# Patient Record
Sex: Female | Born: 1985 | Race: Black or African American | Hispanic: No | Marital: Single | Smoking: Former smoker
Health system: Southern US, Community
[De-identification: ages and names within clinical notes are randomized; demographics above are authoritative.]

## PROBLEM LIST (undated history)

## (undated) DIAGNOSIS — K219 Gastro-esophageal reflux disease without esophagitis: Secondary | ICD-10-CM

## (undated) DIAGNOSIS — T7840XA Allergy, unspecified, initial encounter: Secondary | ICD-10-CM

## (undated) DIAGNOSIS — Z87442 Personal history of urinary calculi: Secondary | ICD-10-CM

## (undated) DIAGNOSIS — F419 Anxiety disorder, unspecified: Secondary | ICD-10-CM

## (undated) DIAGNOSIS — D649 Anemia, unspecified: Secondary | ICD-10-CM

## (undated) HISTORY — DX: Allergy, unspecified, initial encounter: T78.40XA

## (undated) HISTORY — PX: WISDOM TOOTH EXTRACTION: SHX21

---
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
## 2006-09-26 ENCOUNTER — Emergency Department (HOSPITAL_COMMUNITY): Admission: EM | Admit: 2006-09-26 | Discharge: 2006-09-26 | Payer: Self-pay | Admitting: Family Medicine

## 2007-02-10 ENCOUNTER — Encounter: Admission: RE | Admit: 2007-02-10 | Discharge: 2007-02-10 | Payer: Self-pay | Admitting: Neurology

## 2008-04-09 ENCOUNTER — Emergency Department (HOSPITAL_COMMUNITY): Admission: EM | Admit: 2008-04-09 | Discharge: 2008-04-09 | Payer: Self-pay | Admitting: Emergency Medicine

## 2008-11-18 ENCOUNTER — Emergency Department (HOSPITAL_COMMUNITY): Admission: EM | Admit: 2008-11-18 | Discharge: 2008-11-18 | Payer: Self-pay | Admitting: Emergency Medicine

## 2009-02-20 ENCOUNTER — Emergency Department (HOSPITAL_COMMUNITY): Admission: EM | Admit: 2009-02-20 | Discharge: 2009-02-20 | Payer: Self-pay | Admitting: Emergency Medicine

## 2010-02-27 ENCOUNTER — Emergency Department (HOSPITAL_COMMUNITY): Admission: EM | Admit: 2010-02-27 | Discharge: 2010-02-27 | Payer: Self-pay | Admitting: Family Medicine

## 2010-07-14 ENCOUNTER — Emergency Department (HOSPITAL_COMMUNITY)
Admission: EM | Admit: 2010-07-14 | Discharge: 2010-07-14 | Payer: Self-pay | Source: Home / Self Care | Admitting: Emergency Medicine

## 2011-01-27 ENCOUNTER — Inpatient Hospital Stay (INDEPENDENT_AMBULATORY_CARE_PROVIDER_SITE_OTHER)
Admission: RE | Admit: 2011-01-27 | Discharge: 2011-01-27 | Disposition: A | Discharge status: Home / Self Care | Payer: Self-pay | Source: Ambulatory Visit | Attending: Emergency Medicine | Admitting: Emergency Medicine

## 2011-01-27 ENCOUNTER — Ambulatory Visit (INDEPENDENT_AMBULATORY_CARE_PROVIDER_SITE_OTHER): Payer: Self-pay

## 2011-01-27 DIAGNOSIS — J40 Bronchitis, not specified as acute or chronic: Secondary | ICD-10-CM

## 2011-01-27 DIAGNOSIS — J45909 Unspecified asthma, uncomplicated: Secondary | ICD-10-CM

## 2011-01-27 DIAGNOSIS — J019 Acute sinusitis, unspecified: Secondary | ICD-10-CM

## 2011-01-27 DIAGNOSIS — J309 Allergic rhinitis, unspecified: Secondary | ICD-10-CM

## 2011-01-27 DIAGNOSIS — K219 Gastro-esophageal reflux disease without esophagitis: Secondary | ICD-10-CM

## 2011-04-05 LAB — POCT INFECTIOUS MONO SCREEN: Mono Screen: NEGATIVE

## 2011-05-07 LAB — OB RESULTS CONSOLE GC/CHLAMYDIA: Gonorrhea: NEGATIVE

## 2011-05-07 LAB — OB RESULTS CONSOLE ABO/RH

## 2011-05-07 LAB — OB RESULTS CONSOLE HIV ANTIBODY (ROUTINE TESTING): HIV: NONREACTIVE

## 2011-05-07 LAB — OB RESULTS CONSOLE ANTIBODY SCREEN: Antibody Screen: NEGATIVE

## 2011-06-15 ENCOUNTER — Encounter: Payer: Self-pay | Admitting: *Deleted

## 2011-06-15 ENCOUNTER — Emergency Department (HOSPITAL_COMMUNITY)
Admission: EM | Admit: 2011-06-15 | Discharge: 2011-06-15 | Disposition: A | Discharge status: Home / Self Care | Payer: Self-pay | Source: Home / Self Care | Attending: Emergency Medicine | Admitting: Emergency Medicine

## 2011-06-15 DIAGNOSIS — J111 Influenza due to unidentified influenza virus with other respiratory manifestations: Secondary | ICD-10-CM

## 2011-06-15 MED ORDER — ACETAMINOPHEN 325 MG PO TABS
650.0000 mg | ORAL_TABLET | Freq: Once | ORAL | Status: AC
  Administered 2011-06-15: 650 mg via ORAL

## 2011-06-15 MED ORDER — ACETAMINOPHEN-CODEINE #3 300-30 MG PO TABS: 1.0000 | ORAL_TABLET | Freq: Four times a day (QID) | ORAL | Status: AC | PRN

## 2011-06-15 MED ORDER — ACETAMINOPHEN 325 MG PO TABS
ORAL_TABLET | ORAL | Status: AC
  Filled 2011-06-15: qty 2

## 2011-06-15 NOTE — ED Provider Notes (Signed)
History     CSN: [id number] Arrival date & time: 06/15/2011  6:47 PM   First MD Initiated Contact with Patient 06/15/11 1832      Chief Complaint  Patient presents with  . Fever    (Consider location/radiation/quality/duration/timing/severity/associated sxs/prior treatment) HPI Comments: Feels like I have the flu or something, coughing and congested, the first day vomited, having body aches, runny nose  and headaches  Patient is a 25 y.o. female presenting with fever. The history is provided by the patient.  Fever Primary symptoms of the febrile illness include fever, headaches, cough, vomiting, myalgias and arthralgias. Primary symptoms do not include wheezing, shortness of breath or rash. The current episode started 3 to 5 days ago. This is a new problem. The problem has not changed since onset.   History reviewed. No pertinent past medical history.  History reviewed. No pertinent past surgical history.  History reviewed. No pertinent family history.  History  Substance Use Topics  . Smoking status: Never Smoker   . Smokeless tobacco: Not on file  . Alcohol Use: No    OB History    Grav Para Term Preterm Abortions TAB SAB Ect Mult Living   1               Review of Systems  Constitutional: Positive for fever.  Respiratory: Positive for cough. Negative for shortness of breath and wheezing.   Gastrointestinal: Positive for vomiting.  Genitourinary: Negative for vaginal bleeding.  Musculoskeletal: Positive for myalgias and arthralgias.  Skin: Negative for rash.  Neurological: Positive for headaches.    Allergies  Review of patient's allergies indicates no known allergies.  Home Medications   Current Outpatient Rx  Name Route Sig Dispense Refill  . GUAIFENESIN 100 MG/5ML PO LIQD Oral Take 200 mg by mouth 3 (three) times daily as needed.      . ACETAMINOPHEN-CODEINE #3 300-30 MG PO TABS Oral Take 1-2 tablets by mouth every 6 (six) hours as needed for pain. 15  tablet 0    BP 135/72  Pulse 115  Temp(Src) 101.6 F (38.7 C) (Oral)  Resp 16  SpO2 100%  LMP 01/04/2011  Physical Exam  Nursing note and vitals reviewed. Constitutional: She is oriented to person, place, and time. She appears well-developed. She is active.  Non-toxic appearance. She does not have a sickly appearance. No distress.  HENT:  Head: Normocephalic.  Right Ear: Tympanic membrane normal.  Left Ear: Tympanic membrane normal.  Nose: Rhinorrhea present.  Mouth/Throat: Uvula is midline and mucous membranes are normal. Posterior oropharyngeal erythema present. No oropharyngeal exudate or tonsillar abscesses.  Eyes: Conjunctivae are normal.  Neck: Trachea normal and normal range of motion. No JVD present.  Cardiovascular: Normal rate.   Pulmonary/Chest: Effort normal and breath sounds normal. No respiratory distress. She has no decreased breath sounds. She has no wheezes. She has no rhonchi. She has no rales.  Lymphadenopathy:    She has no cervical adenopathy.  Neurological: She is alert and oriented to person, place, and time.  Skin: Skin is warm.    ED Course  Procedures (including critical care time)  Labs Reviewed - No data to display No results found.   1. Influenza-like illness       MDM  ILI x 4 days.No DYSPNEA comfortable but febrile. Obvious upper respiratory congestion ( rhinorrhea)        Jimmie Molly, MD 06/15/11 2104

## 2011-06-15 NOTE — ED Notes (Signed)
Pt  Has  Symptoms  Of  Cough  /  Body  Aches  Fever      Vomiting  As  Well  As  General  Weakness  As  Well  -  The  Symptoms  Have  Been present      X  Several days      She is   Preg

## 2011-07-20 ENCOUNTER — Other Ambulatory Visit (HOSPITAL_COMMUNITY): Payer: Self-pay | Admitting: Obstetrics and Gynecology

## 2011-07-20 DIAGNOSIS — Z3689 Encounter for other specified antenatal screening: Secondary | ICD-10-CM

## 2011-08-16 ENCOUNTER — Ambulatory Visit (HOSPITAL_COMMUNITY): Payer: Medicaid Other

## 2011-08-20 ENCOUNTER — Ambulatory Visit (HOSPITAL_COMMUNITY)
Admission: RE | Admit: 2011-08-20 | Discharge: 2011-08-20 | Disposition: A | Discharge status: Home / Self Care | Payer: Medicaid Other | Source: Ambulatory Visit | Attending: Obstetrics and Gynecology | Admitting: Obstetrics and Gynecology

## 2011-08-20 DIAGNOSIS — Z363 Encounter for antenatal screening for malformations: Secondary | ICD-10-CM | POA: Insufficient documentation

## 2011-08-20 DIAGNOSIS — O99210 Obesity complicating pregnancy, unspecified trimester: Secondary | ICD-10-CM | POA: Insufficient documentation

## 2011-08-20 DIAGNOSIS — O358XX0 Maternal care for other (suspected) fetal abnormality and damage, not applicable or unspecified: Secondary | ICD-10-CM | POA: Insufficient documentation

## 2011-08-20 DIAGNOSIS — Z3689 Encounter for other specified antenatal screening: Secondary | ICD-10-CM

## 2011-08-20 DIAGNOSIS — E669 Obesity, unspecified: Secondary | ICD-10-CM | POA: Insufficient documentation

## 2011-08-20 DIAGNOSIS — Z1389 Encounter for screening for other disorder: Secondary | ICD-10-CM | POA: Insufficient documentation

## 2011-08-24 ENCOUNTER — Other Ambulatory Visit (HOSPITAL_COMMUNITY): Payer: Self-pay | Admitting: Obstetrics and Gynecology

## 2011-08-24 DIAGNOSIS — Z3689 Encounter for other specified antenatal screening: Secondary | ICD-10-CM

## 2011-09-08 ENCOUNTER — Emergency Department (HOSPITAL_COMMUNITY)
Admission: EM | Admit: 2011-09-08 | Discharge: 2011-09-08 | Disposition: A | Discharge status: Home Health | Payer: Medicaid Other | Source: Home / Self Care | Attending: Emergency Medicine | Admitting: Emergency Medicine

## 2011-09-08 ENCOUNTER — Other Ambulatory Visit: Payer: Self-pay

## 2011-09-08 ENCOUNTER — Encounter (HOSPITAL_COMMUNITY): Payer: Self-pay | Admitting: Emergency Medicine

## 2011-09-08 ENCOUNTER — Emergency Department (HOSPITAL_COMMUNITY)
Admission: EM | Admit: 2011-09-08 | Discharge: 2011-09-09 | Disposition: A | Discharge status: Home / Self Care | Payer: Medicaid Other | Attending: Emergency Medicine | Admitting: Emergency Medicine

## 2011-09-08 DIAGNOSIS — R079 Chest pain, unspecified: Secondary | ICD-10-CM | POA: Insufficient documentation

## 2011-09-08 DIAGNOSIS — J45909 Unspecified asthma, uncomplicated: Secondary | ICD-10-CM | POA: Insufficient documentation

## 2011-09-08 DIAGNOSIS — R000 Tachycardia, unspecified: Secondary | ICD-10-CM | POA: Insufficient documentation

## 2011-09-08 DIAGNOSIS — R059 Cough, unspecified: Secondary | ICD-10-CM

## 2011-09-08 DIAGNOSIS — O9989 Other specified diseases and conditions complicating pregnancy, childbirth and the puerperium: Secondary | ICD-10-CM | POA: Insufficient documentation

## 2011-09-08 DIAGNOSIS — J189 Pneumonia, unspecified organism: Secondary | ICD-10-CM

## 2011-09-08 DIAGNOSIS — R05 Cough: Secondary | ICD-10-CM

## 2011-09-08 MED ORDER — AZITHROMYCIN 250 MG PO TABS
500.0000 mg | ORAL_TABLET | Freq: Once | ORAL | Status: AC
  Administered 2011-09-08: 500 mg via ORAL
  Filled 2011-09-08: qty 2

## 2011-09-08 MED ORDER — AZITHROMYCIN 250 MG PO TABS: 250.0000 mg | ORAL_TABLET | Freq: Every day | ORAL | Status: DC

## 2011-09-08 NOTE — Discharge Instructions (Signed)
Follow up with Dr. Tawanna Cooler as soon as possible, but definitely by early next week. Take medications as prescribed. Followup with your doctor in regards to your hospital visit. If you do not have a doctor use the resource guide listed below to help you find one. You may return to the emergency department if symptoms worsen, become progressive, or become more concerning.  Pneumonia, Adult Pneumonia is an infection of the lungs.  CAUSES Pneumonia may be caused by bacteria or a virus. Usually, these infections are caused by breathing infectious particles into the lungs (respiratory tract). SYMPTOMS   Cough.   Fever.   Chest pain.   Increased rate of breathing.   Wheezing.   Mucus production.  DIAGNOSIS  If you have the common symptoms of pneumonia, your caregiver will typically confirm the diagnosis with a chest X-ray. The X-ray will show an abnormality in the lung (pulmonary infiltrate) if you have pneumonia. Other tests of your blood, urine, or sputum may be done to find the specific cause of your pneumonia. Your caregiver may also do tests (blood gases or pulse oximetry) to see how well your lungs are working. TREATMENT  Some forms of pneumonia may be spread to other people when you cough or sneeze. You may be asked to wear a mask before and during your exam. Pneumonia that is caused by bacteria is treated with antibiotic medicine. Pneumonia that is caused by the influenza virus may be treated with an antiviral medicine. Most other viral infections must run their course. These infections will not respond to antibiotics.  PREVENTION A pneumococcal shot (vaccine) is available to prevent a common bacterial cause of pneumonia. This is usually suggested for:  People over 80 years old.   Patients on chemotherapy.   People with chronic lung problems, such as bronchitis or emphysema.   People with immune system problems.  If you are over 65 or have a high risk condition, you may receive the  pneumococcal vaccine if you have not received it before. In some countries, a routine influenza vaccine is also recommended. This vaccine can help prevent some cases of pneumonia.You may be offered the influenza vaccine as part of your care. If you smoke, it is time to quit. You may receive instructions on how to stop smoking. Your caregiver can provide medicines and counseling to help you quit. HOME CARE INSTRUCTIONS   Cough suppressants may be used if you are losing too much rest. However, coughing protects you by clearing your lungs. You should avoid using cough suppressants if you can.   Your caregiver may have prescribed medicine if he or she thinks your pneumonia is caused by a bacteria or influenza. Finish your medicine even if you start to feel better.   Your caregiver may also prescribe an expectorant. This loosens the mucus to be coughed up.   Only take over-the-counter or prescription medicines for pain, discomfort, or fever as directed by your caregiver.   Do not smoke. Smoking is a common cause of bronchitis and can contribute to pneumonia. If you are a smoker and continue to smoke, your cough may last several weeks after your pneumonia has cleared.   A cold steam vaporizer or humidifier in your room or home may help loosen mucus.   Coughing is often worse at night. Sleeping in a semi-upright position in a recliner or using a couple pillows under your head will help with this.   Get rest as you feel it is needed. Your body will usually  let you know when you need to rest.  SEEK IMMEDIATE MEDICAL CARE IF:   Your illness becomes worse. This is especially true if you are elderly or weakened from any other disease.   You cannot control your cough with suppressants and are losing sleep.   You begin coughing up blood.   You develop pain which is getting worse or is uncontrolled with medicines.   You have a fever.   Any of the symptoms which initially brought you in for treatment  are getting worse rather than better.   You develop shortness of breath or chest pain.  MAKE SURE YOU:   Understand these instructions.   Will watch your condition.   Will get help right away if you are not doing well or get worse.

## 2011-09-08 NOTE — ED Provider Notes (Signed)
Patient with cough for a week productive of yellow sputum.  No fever, taking po.  States baby is moving well and no contractions.  Plan z-pack and follow up with pmd.   Hilario Quarry, MD 09/08/11 [phone]

## 2011-09-08 NOTE — ED Notes (Signed)
PA at bedside.

## 2011-09-08 NOTE — ED Notes (Signed)
PT. REPORTS PRODUCTIVE COUGH WITH SOB FOR 3 WEEKS , STATES 7 MONTHS PREGNANT - G1P0 , DENIES FEVER OR CHILLS. OCCASIONAL NAUSEA /VOMITTING .

## 2011-09-08 NOTE — ED Notes (Signed)
Patient was here this morning; diagnosed with pneumonia.  Patient reports that she has been having chest pains all day and that they have been getting worse of the course of the day; patient reports that the pain worsens upon deep inspiration.  Reports chest pain on right side that radiates to back; reports short of breath.  Patient also has history of asthma.  Patient 7 months pregnant.  EKG done in triage.

## 2011-09-08 NOTE — ED Provider Notes (Signed)
History     CSN: [id number]  Arrival date & time 09/08/11  8295   First MD Initiated Contact with Patient 09/08/11 0703      Chief Complaint  Patient presents with  . Cough    (Consider location/radiation/quality/duration/timing/severity/associated sxs/prior treatment) HPI Comments: Pt is a 26 yo AA female that is G1P0, 8mo pregnant, has a hx of asthma & presents to the ED w cc of productive cough & SOB x 3 weeks. Pts OBGYN is Dr. Tawanna Cooler. She denies having contractions.   Patient is a 26 y.o. female presenting with cough. The history is provided by the patient.  Cough This is a new problem. Episode onset: 3 weeks. The problem has been gradually worsening. The cough is productive of sputum (white/yellow). There has been no fever. Associated symptoms include sweats, rhinorrhea, sore throat, shortness of breath and wheezing. Pertinent negatives include no chest pain, no chills, no weight loss, no ear congestion, no ear pain, no headaches, no myalgias and no eye redness. Associated symptoms comments: Chest tightness and abdominal pain . Treatments tried: tyelonol cold and cough & robitussin. The treatment provided no relief. She is not a smoker. Her past medical history is significant for asthma. Her past medical history does not include bronchitis, pneumonia, bronchiectasis, COPD or emphysema.    Past Medical History  Diagnosis Date  . Asthma     History reviewed. No pertinent past surgical history.  No family history on file.  History  Substance Use Topics  . Smoking status: Never Smoker   . Smokeless tobacco: Not on file  . Alcohol Use: No    OB History    Grav Para Term Preterm Abortions TAB SAB Ect Mult Living   1               Review of Systems  Constitutional: Negative for fever, chills and weight loss.  HENT: Positive for congestion, sore throat and rhinorrhea. Negative for hearing loss, ear pain, facial swelling, sneezing, drooling, trouble swallowing, neck pain, neck  stiffness, voice change and ear discharge.   Eyes: Negative for redness.  Respiratory: Positive for cough, shortness of breath and wheezing.   Cardiovascular: Negative for chest pain.  Musculoskeletal: Negative for myalgias and back pain.  Neurological: Negative for headaches.  All other systems reviewed and are negative.    Allergies  Review of patient's allergies indicates no known allergies.  Home Medications   Current Outpatient Rx  Name Route Sig Dispense Refill  . GUAIFENESIN 100 MG/5ML PO LIQD Oral Take 200 mg by mouth 3 (three) times daily as needed.        BP 123/65  Pulse 98  Temp(Src) 98.1 F (36.7 C) (Oral)  Resp 14  SpO2 98%  LMP 01/04/2011  Physical Exam  Nursing note and vitals reviewed. Constitutional: She is oriented to person, place, and time. She appears well-developed and well-nourished. No distress.  HENT:  Head: Normocephalic and atraumatic.  Right Ear: External ear normal.  Left Ear: External ear normal.  Mouth/Throat: Oropharynx is clear and moist.       Oropharynx clear, no tonsillar exudate, uvula midline, airway intact  Eyes: Conjunctivae and EOM are normal.  Neck: Normal range of motion. Neck supple.       Extension and flexion of neck w out difficulty. No stridor  Cardiovascular: Normal rate, regular rhythm, normal heart sounds and intact distal pulses.   Pulmonary/Chest: Effort normal.       Insp & expiratory wheezes, rhonchi RUL, No accessory  muscle use, nasal flaring, respiratory distress. Pt able to speak full sentences. Actively coughing every min.   Abdominal:       Gravid, non ttp  Musculoskeletal: Normal range of motion. She exhibits no edema and no tenderness.  Lymphadenopathy:    She has no cervical adenopathy.  Neurological: She is alert and oriented to person, place, and time.  Skin: Skin is warm. No rash noted. She is not diaphoretic.    ED Course  Procedures (including critical care time)  Labs Reviewed - No data to  display No results found.   No diagnosis found.    MDM  Question CAP  Pts presentation and PE concerning for CAP. Will treat with z-pak. Advised pt to follow up w her OBGYN this week or first thing next week for follow up and to continue taking Tylenol for pain. Pt advised not to take or use caution taking guaifenesin bc limited pregnancy information is on this medication Pt verbalizes understanding. VSS in NAD prior to dc.       Jaci Carrel, [place] 09/08/11 0730

## 2011-09-09 MED ORDER — ALBUTEROL SULFATE (5 MG/ML) 0.5% IN NEBU
2.5000 mg | INHALATION_SOLUTION | Freq: Once | RESPIRATORY_TRACT | Status: AC
  Administered 2011-09-09: 2.5 mg via RESPIRATORY_TRACT
  Filled 2011-09-09: qty 0.5

## 2011-09-09 NOTE — ED Provider Notes (Signed)
Medical screening examination/treatment/procedure(s) were performed by non-physician practitioner and as supervising physician I was immediately available for consultation/collaboration.  Hy Swiatek P Burhanuddin Kohlmann, MD 09/09/11 0401 

## 2011-09-09 NOTE — ED Provider Notes (Signed)
History     CSN: [id number]  Arrival date & time 09/08/11  2226   First MD Initiated Contact with Patient 09/09/11 0018      Chief Complaint  Patient presents with  . Pneumonia  . Chest Pain    (Consider location/radiation/quality/duration/timing/severity/associated sxs/prior treatment) HPI Comments: Patient was diagnosed with pneumonia earlier in the day, prescribed azithromycin.  She is 7 months pregnant.  There was positive fetal movements.  There positive fetal movements now  The history is provided by the patient.    Past Medical History  Diagnosis Date  . Asthma     History reviewed. No pertinent past surgical history.  History reviewed. No pertinent family history.  History  Substance Use Topics  . Smoking status: Never Smoker   . Smokeless tobacco: Not on file  . Alcohol Use: No    OB History    Grav Para Term Preterm Abortions TAB SAB Ect Mult Living   1               Review of Systems  Constitutional: Negative for fever, chills and appetite change.  HENT: Negative for congestion and rhinorrhea.   Respiratory: Negative for chest tightness and shortness of breath.   Cardiovascular: Negative for leg swelling.  Neurological: Negative for dizziness.    Allergies  Review of patient's allergies indicates no known allergies.  Home Medications   Current Outpatient Rx  Name Route Sig Dispense Refill  . AZITHROMYCIN 250 MG PO TABS Oral Take 250 mg by mouth daily.    . GUAIFENESIN 100 MG/5ML PO LIQD Oral Take 200 mg by mouth 3 (three) times daily as needed.        BP 115/89  Pulse 103  Temp(Src) 98.1 F (36.7 C) (Oral)  Resp 20  SpO2 97%  LMP 01/04/2011  Physical Exam  Constitutional: She is oriented to person, place, and time. She appears well-developed and well-nourished.  HENT:  Head: Normocephalic.  Eyes: Pupils are equal, round, and reactive to light.  Neck: Normal range of motion.  Cardiovascular: Tachycardia present.   Pulmonary/Chest:  Effort normal. She has wheezes. She exhibits no tenderness.  Abdominal: Soft.  Musculoskeletal: Normal range of motion.  Neurological: She is alert and oriented to person, place, and time.  Skin: Skin is warm.    ED Course  Procedures (including critical care time)  Labs Reviewed - No data to display No results found.   1. Community acquired pneumonia   2. Asthma     After albuterol treatment.  Patient is coughing much less  MDM  Pneumonia with asthma.  Patient has not used her albuterol inhaler at home, but she didn't think she could during her pregnancy and encouraged her to to use her inhaler on a regular basis for the next 2 days to please call her OB/GYN in the morning to continue taking the antibiotic as directed and to monitor fetal movements        Arman Filter, NP 09/09/11 0200

## 2011-09-09 NOTE — Discharge Instructions (Signed)
Asthma, Adult Asthma is a disease of the lungs and can make it hard to breathe. Asthma cannot be cured, but medicine can help control it. Asthma may be started (triggered) by:  Pollen.   Dust.   Animal skin flakes (dander).   Molds.   Foods.   Respiratory infections (colds, flu).   Smoke.   Exercise.   Stress.   Other things that cause allergic reactions or allergies (allergens).  HOME CARE   Talk to your doctor about how to manage your attacks at home. This may include:   Using a tool called a peak flow meter.   Having medicine ready to stop the attack.   Take all medicine as told by your doctor.   Wash bed sheets and blankets every week in hot water and put them in the dryer.   Drink enough fluids to keep your pee (urine) clear or pale yellow.   Always be ready to get emergency help. Write down the phone number for your doctor. Keep it where you can easily find it.   Talk about exercise routines with your doctor.   If animal dander is causing your asthma, you may need to find a new home for your pet(s).  GET HELP RIGHT AWAY IF:   You have muscle aches.   You cough more.   You have chest pain.   You have thick spit (sputum) that changes to yellow, green, gray, or bloody.   Medicine does not stop your wheezing.   You have problems breathing.   You have a fever.   Your medicine causes:   A rash.   Itching.   Puffiness (swelling).   Breathing problems.  MAKE SURE YOU:   Understand these instructions.   Will watch your condition.   Will get help right away if you are not doing well or get worse.  Document Released: 12/08/2007 Document Revised: 06/10/2011 Document Reviewed: 05/01/2008 [facility] Patient Information 2012 [place], [place]. As discussed.  Don't be afraid to use her inhaler every 4-6 hours as needed for coughing to try to stay very hydrated try to avoid caffeine was called.  Dr. Jackelyn Knife in the morning for further instructions please  monitor your baby's movements

## 2011-09-17 ENCOUNTER — Ambulatory Visit (HOSPITAL_COMMUNITY): Payer: Medicaid Other

## 2011-09-20 ENCOUNTER — Other Ambulatory Visit (HOSPITAL_COMMUNITY): Payer: Self-pay | Admitting: Obstetrics and Gynecology

## 2011-09-20 DIAGNOSIS — Z3689 Encounter for other specified antenatal screening: Secondary | ICD-10-CM

## 2011-09-23 ENCOUNTER — Ambulatory Visit (HOSPITAL_COMMUNITY)
Admission: RE | Admit: 2011-09-23 | Discharge: 2011-09-23 | Disposition: A | Discharge status: Home / Self Care | Payer: Medicaid Other | Source: Ambulatory Visit | Attending: Obstetrics and Gynecology | Admitting: Obstetrics and Gynecology

## 2011-09-23 DIAGNOSIS — Z3689 Encounter for other specified antenatal screening: Secondary | ICD-10-CM

## 2011-09-23 DIAGNOSIS — E669 Obesity, unspecified: Secondary | ICD-10-CM | POA: Insufficient documentation

## 2011-12-06 ENCOUNTER — Encounter (HOSPITAL_COMMUNITY): Payer: Self-pay | Admitting: *Deleted

## 2011-12-06 ENCOUNTER — Encounter (HOSPITAL_COMMUNITY): Payer: Self-pay | Admitting: Anesthesiology

## 2011-12-06 ENCOUNTER — Inpatient Hospital Stay (HOSPITAL_COMMUNITY): Payer: Medicaid Other

## 2011-12-06 ENCOUNTER — Inpatient Hospital Stay (HOSPITAL_COMMUNITY)
Admission: AD | Admit: 2011-12-06 | Discharge: 2011-12-10 | DRG: 765 | Disposition: A | Discharge status: Home / Self Care | Payer: Medicaid Other | Source: Ambulatory Visit | Attending: Obstetrics and Gynecology | Admitting: Obstetrics and Gynecology

## 2011-12-06 ENCOUNTER — Encounter (HOSPITAL_COMMUNITY)
Admission: AD | Disposition: A | Discharge status: Home / Self Care | Payer: Self-pay | Source: Ambulatory Visit | Attending: Obstetrics and Gynecology

## 2011-12-06 ENCOUNTER — Inpatient Hospital Stay (HOSPITAL_COMMUNITY): Payer: Medicaid Other | Admitting: Anesthesiology

## 2011-12-06 DIAGNOSIS — O99214 Obesity complicating childbirth: Secondary | ICD-10-CM | POA: Diagnosis present

## 2011-12-06 DIAGNOSIS — O4290 Premature rupture of membranes, unspecified as to length of time between rupture and onset of labor, unspecified weeks of gestation: Principal | ICD-10-CM | POA: Diagnosis present

## 2011-12-06 DIAGNOSIS — O99892 Other specified diseases and conditions complicating childbirth: Secondary | ICD-10-CM | POA: Diagnosis present

## 2011-12-06 DIAGNOSIS — E669 Obesity, unspecified: Secondary | ICD-10-CM | POA: Diagnosis present

## 2011-12-06 DIAGNOSIS — O4100X0 Oligohydramnios, unspecified trimester, not applicable or unspecified: Secondary | ICD-10-CM | POA: Diagnosis present

## 2011-12-06 DIAGNOSIS — Z98891 History of uterine scar from previous surgery: Secondary | ICD-10-CM

## 2011-12-06 DIAGNOSIS — Z22330 Carrier of Group B streptococcus: Secondary | ICD-10-CM

## 2011-12-06 LAB — CBC
HCT: 28.7 % — ABNORMAL LOW (ref 36.0–46.0)
MCH: 25.1 pg — ABNORMAL LOW (ref 26.0–34.0)
MCHC: 30.7 g/dL (ref 30.0–36.0)
MCV: 82 fL (ref 78.0–100.0)
RDW: 16.5 % — ABNORMAL HIGH (ref 11.5–15.5)

## 2011-12-06 LAB — ABO/RH: ABO/RH(D): O POS

## 2011-12-06 SURGERY — Surgical Case
Anesthesia: Regional | Site: Abdomen | Wound class: Clean Contaminated

## 2011-12-06 MED ORDER — ACETAMINOPHEN 325 MG PO TABS: 650.0000 mg | ORAL_TABLET | ORAL | Status: DC | PRN

## 2011-12-06 MED ORDER — IBUPROFEN 600 MG PO TABS
600.0000 mg | ORAL_TABLET | Freq: Four times a day (QID) | ORAL | Status: DC
  Administered 2011-12-07 – 2011-12-10 (×14): 600 mg via ORAL
  Filled 2011-12-06 (×14): qty 1

## 2011-12-06 MED ORDER — LACTATED RINGERS IV SOLN: 500.0000 mL | Freq: Once | INTRAVENOUS | Status: DC

## 2011-12-06 MED ORDER — CITRIC ACID-SODIUM CITRATE 334-500 MG/5ML PO SOLN
30.0000 mL | ORAL | Status: DC | PRN
  Administered 2011-12-06: 30 mL via ORAL
  Filled 2011-12-06: qty 15

## 2011-12-06 MED ORDER — EPHEDRINE 5 MG/ML INJ: 10.0000 mg | INTRAVENOUS | Status: DC | PRN

## 2011-12-06 MED ORDER — OXYTOCIN 20 UNITS IN LACTATED RINGERS INFUSION - SIMPLE
1.0000 m[IU]/min | INTRAVENOUS | Status: DC
  Administered 2011-12-06 (×2): 1 m[IU]/min via INTRAVENOUS
  Filled 2011-12-06: qty 1000

## 2011-12-06 MED ORDER — MEPERIDINE HCL 25 MG/ML IJ SOLN: 6.2500 mg | INTRAMUSCULAR | Status: DC | PRN

## 2011-12-06 MED ORDER — DIPHENHYDRAMINE HCL 12.5 MG/5ML PO ELIX
12.5000 mg | ORAL_SOLUTION | Freq: Four times a day (QID) | ORAL | Status: DC | PRN
  Filled 2011-12-06: qty 5

## 2011-12-06 MED ORDER — SODIUM CHLORIDE 0.9 % IJ SOLN: 9.0000 mL | INTRAMUSCULAR | Status: DC | PRN

## 2011-12-06 MED ORDER — MORPHINE SULFATE (PF) 0.5 MG/ML IJ SOLN
INTRAMUSCULAR | Status: DC | PRN
  Administered 2011-12-06: 3 mg via EPIDURAL

## 2011-12-06 MED ORDER — PHENYLEPHRINE 40 MCG/ML (10ML) SYRINGE FOR IV PUSH (FOR BLOOD PRESSURE SUPPORT)
80.0000 ug | PREFILLED_SYRINGE | INTRAVENOUS | Status: DC | PRN
  Filled 2011-12-06: qty 5

## 2011-12-06 MED ORDER — ONDANSETRON HCL 4 MG/2ML IJ SOLN: 4.0000 mg | Freq: Four times a day (QID) | INTRAMUSCULAR | Status: DC | PRN

## 2011-12-06 MED ORDER — OXYCODONE-ACETAMINOPHEN 5-325 MG PO TABS
1.0000 | ORAL_TABLET | ORAL | Status: DC | PRN
  Administered 2011-12-07 (×2): 2 via ORAL
  Administered 2011-12-08: 1 via ORAL
  Administered 2011-12-08: 2 via ORAL
  Administered 2011-12-08 – 2011-12-09 (×3): 1 via ORAL
  Administered 2011-12-09: 2 via ORAL
  Administered 2011-12-09 – 2011-12-10 (×4): 1 via ORAL
  Filled 2011-12-06 (×2): qty 1
  Filled 2011-12-06 (×2): qty 2
  Filled 2011-12-06 (×6): qty 1
  Filled 2011-12-06 (×2): qty 2

## 2011-12-06 MED ORDER — SCOPOLAMINE 1 MG/3DAYS TD PT72: 1.0000 | MEDICATED_PATCH | Freq: Once | TRANSDERMAL | Status: DC

## 2011-12-06 MED ORDER — PHENYLEPHRINE 40 MCG/ML (10ML) SYRINGE FOR IV PUSH (FOR BLOOD PRESSURE SUPPORT): 80.0000 ug | PREFILLED_SYRINGE | INTRAVENOUS | Status: DC | PRN

## 2011-12-06 MED ORDER — EPHEDRINE SULFATE 50 MG/ML IJ SOLN
INTRAMUSCULAR | Status: DC | PRN
  Administered 2011-12-06: 10 mg via INTRAVENOUS

## 2011-12-06 MED ORDER — OXYTOCIN 20 UNITS IN LACTATED RINGERS INFUSION - SIMPLE: 125.0000 mL/h | Freq: Once | INTRAVENOUS | Status: DC

## 2011-12-06 MED ORDER — ONDANSETRON HCL 4 MG/2ML IJ SOLN
INTRAMUSCULAR | Status: AC
  Filled 2011-12-06: qty 2

## 2011-12-06 MED ORDER — NALOXONE HCL 0.4 MG/ML IJ SOLN: 0.4000 mg | INTRAMUSCULAR | Status: DC | PRN

## 2011-12-06 MED ORDER — SODIUM BICARBONATE 8.4 % IV SOLN
INTRAVENOUS | Status: AC
  Filled 2011-12-06: qty 50

## 2011-12-06 MED ORDER — SENNOSIDES-DOCUSATE SODIUM 8.6-50 MG PO TABS
2.0000 | ORAL_TABLET | Freq: Every day | ORAL | Status: DC
  Administered 2011-12-07 – 2011-12-09 (×3): 2 via ORAL

## 2011-12-06 MED ORDER — LACTATED RINGERS IV SOLN
INTRAVENOUS | Status: DC
  Administered 2011-12-07: 03:00:00 via INTRAVENOUS

## 2011-12-06 MED ORDER — PRENATAL MULTIVITAMIN CH
1.0000 | ORAL_TABLET | Freq: Every day | ORAL | Status: DC
  Administered 2011-12-07: 09:00:00 via ORAL
  Administered 2011-12-08 – 2011-12-09 (×2): 1 via ORAL
  Filled 2011-12-06 (×3): qty 1

## 2011-12-06 MED ORDER — LIDOCAINE HCL (PF) 1 % IJ SOLN
INTRAMUSCULAR | Status: DC | PRN
  Administered 2011-12-06 (×2): 4 mL

## 2011-12-06 MED ORDER — LIDOCAINE-EPINEPHRINE (PF) 2 %-1:200000 IJ SOLN
INTRAMUSCULAR | Status: AC
  Filled 2011-12-06: qty 20

## 2011-12-06 MED ORDER — SODIUM CHLORIDE 0.9 % IJ SOLN: 3.0000 mL | INTRAMUSCULAR | Status: DC | PRN

## 2011-12-06 MED ORDER — TERBUTALINE SULFATE 1 MG/ML IJ SOLN: 0.2500 mg | Freq: Once | INTRAMUSCULAR | Status: DC | PRN

## 2011-12-06 MED ORDER — HYDROMORPHONE 0.3 MG/ML IV SOLN
INTRAVENOUS | Status: DC
  Administered 2011-12-06: 20:00:00 via INTRAVENOUS
  Administered 2011-12-07: 1.79 mg via INTRAVENOUS
  Administered 2011-12-07: 1.59 mg via INTRAVENOUS
  Filled 2011-12-06: qty 25

## 2011-12-06 MED ORDER — TETANUS-DIPHTH-ACELL PERTUSSIS 5-2.5-18.5 LF-MCG/0.5 IM SUSP
0.5000 mL | Freq: Once | INTRAMUSCULAR | Status: AC
  Administered 2011-12-08: 0.5 mL via INTRAMUSCULAR
  Filled 2011-12-06: qty 0.5

## 2011-12-06 MED ORDER — OXYTOCIN 10 UNIT/ML IJ SOLN
INTRAMUSCULAR | Status: AC
  Filled 2011-12-06: qty 4

## 2011-12-06 MED ORDER — ONDANSETRON HCL 4 MG PO TABS: 4.0000 mg | ORAL_TABLET | ORAL | Status: DC | PRN

## 2011-12-06 MED ORDER — KETOROLAC TROMETHAMINE 30 MG/ML IJ SOLN
30.0000 mg | Freq: Four times a day (QID) | INTRAMUSCULAR | Status: AC | PRN
  Administered 2011-12-06: 30 mg via INTRAVENOUS

## 2011-12-06 MED ORDER — ONDANSETRON HCL 4 MG/2ML IJ SOLN: 4.0000 mg | INTRAMUSCULAR | Status: DC | PRN

## 2011-12-06 MED ORDER — FENTANYL CITRATE 0.05 MG/ML IJ SOLN
INTRAMUSCULAR | Status: AC
  Administered 2011-12-06: 50 ug
  Filled 2011-12-06: qty 2

## 2011-12-06 MED ORDER — OXYTOCIN BOLUS FROM INFUSION
500.0000 mL | Freq: Once | INTRAVENOUS | Status: DC
  Filled 2011-12-06: qty 500

## 2011-12-06 MED ORDER — DIPHENHYDRAMINE HCL 50 MG/ML IJ SOLN: 12.5000 mg | Freq: Four times a day (QID) | INTRAMUSCULAR | Status: DC | PRN

## 2011-12-06 MED ORDER — NALBUPHINE HCL 10 MG/ML IJ SOLN
5.0000 mg | INTRAMUSCULAR | Status: DC | PRN
  Administered 2011-12-06 – 2011-12-07 (×2): 10 mg via INTRAVENOUS
  Filled 2011-12-06 (×3): qty 1

## 2011-12-06 MED ORDER — AZITHROMYCIN 250 MG PO TABS: 250.0000 mg | ORAL_TABLET | Freq: Every day | ORAL | Status: DC

## 2011-12-06 MED ORDER — WITCH HAZEL-GLYCERIN EX PADS: 1.0000 "application " | MEDICATED_PAD | CUTANEOUS | Status: DC | PRN

## 2011-12-06 MED ORDER — CEFAZOLIN SODIUM-DEXTROSE 2-3 GM-% IV SOLR
2.0000 g | Freq: Once | INTRAVENOUS | Status: AC
  Administered 2011-12-06: 2 g via INTRAVENOUS
  Filled 2011-12-06: qty 50

## 2011-12-06 MED ORDER — FENTANYL 2.5 MCG/ML BUPIVACAINE 1/10 % EPIDURAL INFUSION (WH - ANES)
INTRAMUSCULAR | Status: DC | PRN
  Administered 2011-12-06: 14 mL/h via EPIDURAL

## 2011-12-06 MED ORDER — OXYTOCIN 20 UNITS IN LACTATED RINGERS INFUSION - SIMPLE
125.0000 mL/h | INTRAVENOUS | Status: AC
  Administered 2011-12-06: 125 mL/h via INTRAVENOUS

## 2011-12-06 MED ORDER — DIPHENHYDRAMINE HCL 50 MG/ML IJ SOLN: 12.5000 mg | INTRAMUSCULAR | Status: DC | PRN

## 2011-12-06 MED ORDER — LACTATED RINGERS IV SOLN
500.0000 mL | INTRAVENOUS | Status: DC | PRN
  Administered 2011-12-06: 17:00:00 via INTRAVENOUS
  Administered 2011-12-06 (×2): 500 mL via INTRAVENOUS
  Administered 2011-12-06: 16:00:00 via INTRAVENOUS

## 2011-12-06 MED ORDER — DIPHENHYDRAMINE HCL 25 MG PO CAPS
25.0000 mg | ORAL_CAPSULE | ORAL | Status: DC | PRN
  Administered 2011-12-07: 25 mg via ORAL
  Filled 2011-12-06: qty 1

## 2011-12-06 MED ORDER — MORPHINE SULFATE 0.5 MG/ML IJ SOLN
INTRAMUSCULAR | Status: AC
  Filled 2011-12-06: qty 10

## 2011-12-06 MED ORDER — ZOLPIDEM TARTRATE 5 MG PO TABS: 5.0000 mg | ORAL_TABLET | Freq: Every evening | ORAL | Status: DC | PRN

## 2011-12-06 MED ORDER — MORPHINE SULFATE (PF) 0.5 MG/ML IJ SOLN
INTRAMUSCULAR | Status: DC | PRN
  Administered 2011-12-06: 2 mg via INTRAVENOUS

## 2011-12-06 MED ORDER — CEFAZOLIN SODIUM 1-5 GM-% IV SOLN
INTRAVENOUS | Status: AC
  Filled 2011-12-06: qty 100

## 2011-12-06 MED ORDER — OXYTOCIN 20 UNITS IN LACTATED RINGERS INFUSION - SIMPLE
INTRAVENOUS | Status: DC | PRN
  Administered 2011-12-06: 20 [IU] via INTRAVENOUS

## 2011-12-06 MED ORDER — ONDANSETRON HCL 4 MG/2ML IJ SOLN: 4.0000 mg | Freq: Three times a day (TID) | INTRAMUSCULAR | Status: DC | PRN

## 2011-12-06 MED ORDER — MENTHOL 3 MG MT LOZG: 1.0000 | LOZENGE | OROMUCOSAL | Status: DC | PRN

## 2011-12-06 MED ORDER — DIBUCAINE 1 % RE OINT: 1.0000 "application " | TOPICAL_OINTMENT | RECTAL | Status: DC | PRN

## 2011-12-06 MED ORDER — KETOROLAC TROMETHAMINE 30 MG/ML IJ SOLN: 30.0000 mg | Freq: Four times a day (QID) | INTRAMUSCULAR | Status: AC | PRN

## 2011-12-06 MED ORDER — OXYTOCIN 20 UNITS IN LACTATED RINGERS INFUSION - SIMPLE
INTRAVENOUS | Status: AC
  Administered 2011-12-06: 125 mL/h via INTRAVENOUS
  Filled 2011-12-06: qty 1000

## 2011-12-06 MED ORDER — FENTANYL CITRATE 0.05 MG/ML IJ SOLN
INTRAMUSCULAR | Status: AC
  Administered 2011-12-06: 50 ug via INTRAVENOUS
  Filled 2011-12-06: qty 2

## 2011-12-06 MED ORDER — IBUPROFEN 600 MG PO TABS: 600.0000 mg | ORAL_TABLET | Freq: Four times a day (QID) | ORAL | Status: DC | PRN

## 2011-12-06 MED ORDER — LACTATED RINGERS IV SOLN
INTRAVENOUS | Status: DC
  Administered 2011-12-06 (×2): via INTRAVENOUS

## 2011-12-06 MED ORDER — NALBUPHINE HCL 10 MG/ML IJ SOLN: 5.0000 mg | INTRAMUSCULAR | Status: DC | PRN

## 2011-12-06 MED ORDER — EPHEDRINE 5 MG/ML INJ
INTRAVENOUS | Status: AC
  Filled 2011-12-06: qty 10

## 2011-12-06 MED ORDER — EPHEDRINE 5 MG/ML INJ
10.0000 mg | INTRAVENOUS | Status: DC | PRN
Start: 2011-12-06 — End: 2011-12-06
  Filled 2011-12-06: qty 4

## 2011-12-06 MED ORDER — PENICILLIN G POTASSIUM 5000000 UNITS IJ SOLR
2.5000 10*6.[IU] | INTRAVENOUS | Status: DC
  Administered 2011-12-06: 2.5 10*6.[IU] via INTRAVENOUS
  Filled 2011-12-06 (×6): qty 2.5

## 2011-12-06 MED ORDER — DIPHENHYDRAMINE HCL 50 MG/ML IJ SOLN: 25.0000 mg | INTRAMUSCULAR | Status: DC | PRN

## 2011-12-06 MED ORDER — LIDOCAINE HCL (PF) 1 % IJ SOLN: 30.0000 mL | INTRAMUSCULAR | Status: DC | PRN

## 2011-12-06 MED ORDER — METOCLOPRAMIDE HCL 5 MG/ML IJ SOLN: 10.0000 mg | Freq: Three times a day (TID) | INTRAMUSCULAR | Status: DC | PRN

## 2011-12-06 MED ORDER — FENTANYL CITRATE 0.05 MG/ML IJ SOLN
25.0000 ug | INTRAMUSCULAR | Status: DC | PRN
  Administered 2011-12-06 (×2): 50 ug via INTRAVENOUS

## 2011-12-06 MED ORDER — FLEET ENEMA 7-19 GM/118ML RE ENEM: 1.0000 | ENEMA | RECTAL | Status: DC | PRN

## 2011-12-06 MED ORDER — SODIUM CHLORIDE 0.9 % IV SOLN: 1.0000 ug/kg/h | INTRAVENOUS | Status: DC | PRN

## 2011-12-06 MED ORDER — SIMETHICONE 80 MG PO CHEW: 80.0000 mg | CHEWABLE_TABLET | ORAL | Status: DC | PRN

## 2011-12-06 MED ORDER — LANOLIN HYDROUS EX OINT: 1.0000 "application " | TOPICAL_OINTMENT | CUTANEOUS | Status: DC | PRN

## 2011-12-06 MED ORDER — SIMETHICONE 80 MG PO CHEW
80.0000 mg | CHEWABLE_TABLET | Freq: Three times a day (TID) | ORAL | Status: DC
  Administered 2011-12-07 – 2011-12-09 (×10): 80 mg via ORAL

## 2011-12-06 MED ORDER — ONDANSETRON HCL 4 MG/2ML IJ SOLN
INTRAMUSCULAR | Status: DC | PRN
  Administered 2011-12-06: 4 mg via INTRAVENOUS

## 2011-12-06 MED ORDER — FENTANYL 2.5 MCG/ML BUPIVACAINE 1/10 % EPIDURAL INFUSION (WH - ANES)
14.0000 mL/h | INTRAMUSCULAR | Status: DC
  Filled 2011-12-06: qty 60

## 2011-12-06 MED ORDER — SODIUM BICARBONATE 8.4 % IV SOLN
INTRAVENOUS | Status: DC | PRN
  Administered 2011-12-06: 3 mL via EPIDURAL

## 2011-12-06 MED ORDER — KETOROLAC TROMETHAMINE 30 MG/ML IJ SOLN
INTRAMUSCULAR | Status: AC
  Administered 2011-12-06: 30 mg via INTRAVENOUS
  Filled 2011-12-06: qty 1

## 2011-12-06 MED ORDER — DIPHENHYDRAMINE HCL 25 MG PO CAPS: 25.0000 mg | ORAL_CAPSULE | Freq: Four times a day (QID) | ORAL | Status: DC | PRN

## 2011-12-06 MED ORDER — PENICILLIN G POTASSIUM 5000000 UNITS IJ SOLR
5.0000 10*6.[IU] | Freq: Once | INTRAVENOUS | Status: AC
  Administered 2011-12-06: 5 10*6.[IU] via INTRAVENOUS
  Filled 2011-12-06: qty 5

## 2011-12-06 MED ORDER — OXYCODONE-ACETAMINOPHEN 5-325 MG PO TABS: 1.0000 | ORAL_TABLET | ORAL | Status: DC | PRN

## 2011-12-06 SURGICAL SUPPLY — 29 items
CHLORAPREP W/TINT 26ML (MISCELLANEOUS) ×2 IMPLANT
CLOTH BEACON ORANGE TIMEOUT ST (SAFETY) ×2 IMPLANT
CONTAINER PREFILL 10% NBF 15ML (MISCELLANEOUS) IMPLANT
DRSG COVADERM 4X10 (GAUZE/BANDAGES/DRESSINGS) ×2 IMPLANT
ELECT REM PT RETURN 9FT ADLT (ELECTROSURGICAL) ×2
ELECTRODE REM PT RTRN 9FT ADLT (ELECTROSURGICAL) ×1 IMPLANT
EXTRACTOR VACUUM KIWI (MISCELLANEOUS) IMPLANT
EXTRACTOR VACUUM M CUP 4 TUBE (SUCTIONS) IMPLANT
GLOVE BIO SURGEON STRL SZ 6.5 (GLOVE) ×4 IMPLANT
GLOVE BIO SURGEON STRL SZ8 (GLOVE) ×2 IMPLANT
GOWN PREVENTION PLUS LG XLONG (DISPOSABLE) ×4 IMPLANT
KIT ABG SYR 3ML LUER SLIP (SYRINGE) IMPLANT
NEEDLE HYPO 25X5/8 SAFETYGLIDE (NEEDLE) ×2 IMPLANT
NS IRRIG 1000ML POUR BTL (IV SOLUTION) ×2 IMPLANT
PACK C SECTION WH (CUSTOM PROCEDURE TRAY) ×2 IMPLANT
RTRCTR C-SECT PINK 25CM LRG (MISCELLANEOUS) ×2 IMPLANT
SLEEVE SCD COMPRESS KNEE MED (MISCELLANEOUS) IMPLANT
STAPLER VISISTAT 35W (STAPLE) IMPLANT
SUT CHROMIC 1 CTX 36 (SUTURE) ×4 IMPLANT
SUT PLAIN 0 NONE (SUTURE) IMPLANT
SUT PLAIN 2 0 XLH (SUTURE) IMPLANT
SUT VIC AB 0 CT1 27 (SUTURE) ×2
SUT VIC AB 0 CT1 27XBRD ANBCTR (SUTURE) ×2 IMPLANT
SUT VIC AB 2-0 CT1 27 (SUTURE)
SUT VIC AB 2-0 CT1 TAPERPNT 27 (SUTURE) IMPLANT
SUT VIC AB 4-0 KS 27 (SUTURE) IMPLANT
TOWEL OR 17X24 6PK STRL BLUE (TOWEL DISPOSABLE) ×4 IMPLANT
TRAY FOLEY CATH 14FR (SET/KITS/TRAYS/PACK) ×2 IMPLANT
WATER STERILE IRR 1000ML POUR (IV SOLUTION) IMPLANT

## 2011-12-06 NOTE — Anesthesia Postprocedure Evaluation (Signed)
  Anesthesia Post-op Note  Patient: Jill Odom  Procedure(s) Performed: Procedure(s) (LRB): CESAREAN SECTION (N/A)  Patient Location: PACU  Anesthesia Type: Epidural  Level of Consciousness: awake, alert  and oriented  Airway and Oxygen Therapy: Patient Spontanous Breathing  Post-op Pain: none  Post-op Assessment: Post-op Vital signs reviewed, Patient's Cardiovascular Status Stable, Respiratory Function Stable, Patent Airway, No signs of Nausea or vomiting, Adequate PO intake and Pain level controlled  Post-op Vital Signs: Reviewed and stable  Complications: No apparent anesthesia complications

## 2011-12-06 NOTE — Progress Notes (Signed)
Patient ID: Jill Odom, female   DOB: [birth year], 26 y.o.   MRN: [id number] As noted, pt came in for SROM at 130am and was placed on pitocin with variable decels noted.  Pitocin was d/c'd and FHR now improved, some variability, occasional mild variables.  Pt now feeling minimal contractions though with Pitocin off.  Pitocin will be restarted. Pt plans epidural and anesthesia aware she is in-house.  Cervix 50/2-3/-2  IUPC and FSE placed.  D/w pt that she is remote from delivery and that if baby does not tolerate labor, we will have to proceed with a c-section.  We reviewed the c-section process by steps and pt expressed understanding.  Will follow FHR closely. Pt is on her PCN for +GBS.

## 2011-12-06 NOTE — Op Note (Signed)
Operative note  Preoperative diagnosis Term pregnancy at 40-3/7 weeks Nonreassuring fetal heart rate with repetitive late decelerations  Postoperative diagnosis Same with occult cord by the fetal head and a nuchal cord x1  Procedure Primary low transverse C-section with 2 layer closure of uterus  Surgeon Dr. Huel Cote  Anesthesia Epidural  Fluids Estimated blood loss 800 cc Urine output 150 cc clear LR 2200 cc  Findings There is a viable female infant in the vertex presentation. An occult cord was noted by the fetal head and a nuchal cord x1. Apgars were 8 and 9. Weight pending at time of dictation normal uterus tubes and ovaries were noted.   Specimen  Placenta sent to L&D   Procedure note  After repetitive late decelerations were noted on the fetal strip the patient was counseled for cesarean section and given informed consent. She was then taken to the operating room where her epidural anesthesia was dosed and found to be adequate by Allis clamp test. She was prepped and draped in the normal sterile fashion in the dorsal supine position with leftward tilt. A time out was performed and a Pfannenstiel skin incision then made with the scalpel and carried through to the underlying layer of fascia by sharp dissection and Bovie cautery. The fascia was then nicked in the midline and the incision extended laterally with Mayo scissors. The inferior aspect of the incision was grasped and dissected off the underlying rectus muscles. In a similar fashion the superior aspect was grasped and dissected off the rectus muscles. These were separated in the midline and the and the peritoneal cavity was entered bluntly. The peritoneal incision was then extended both superiorly and inferiorly with careful attention to avoid both bowel bladder. The Alexis self-retaining wound retractor was then placed within the incision and the lower uterine segment exposed. The uterine segment was then incised in a  transverse fashion and the uterine cavity itself entered bluntly. The vertex was then elevated to the level of the uterine incision and as fundal pressure did not deliver the baby a Kiwi vacuum applied and the vertex delivered with one pull in the green zone. The remainder of the body delivered without difficulty. Neonatology was present and after the cord was clamped and cut assessed the baby in the OR. The placenta was then spontaneously expressed and the uterus cleared of all clots and debris with moist lap sponge. The uterine incision was then closed in 2 layers the first a running locked layer 1-0 chromic and the second an imbricating layer of the same suture. Good hemostasis was observed. The gutters were cleared of all clots and the tubes and ovaries inspected bilaterally and found to be normal. At this point the Alexis retractor was removed and subfascial planes inspected and found to be hemostatic. The rectus muscles and peritoneum were then reapproximated with several interrupted mattress sutures of 2-0 Vicryl. The fascia was then closed with 0 Vicryl in a running fashion. The subcutaneous tissue was reapproximated with 2-0 plain in a running fashion. The skin was closed with a subcuticular stitch of 4-0 Vicryl on a Keith needle. The incision was reinforced with benzoin and Steri-Strips. Sponge lap and needle counts were correct x2 and the patient was taken to the recovery room in good condition.

## 2011-12-06 NOTE — Progress Notes (Signed)
Patient c/o  7/10 incisional pain. Had 30 mg toradol IV in PACU at 1820. Dr. Brayton Caves called. Order received for PCA. Will continue to monitor patient.

## 2011-12-06 NOTE — Anesthesia Procedure Notes (Signed)
Epidural Patient location during procedure: OB Start time: 12/06/2011 2:14 PM  Staffing Anesthesiologist: Mena Lienau A. Performed by: anesthesiologist   Preanesthetic Checklist Completed: patient identified, site marked, surgical consent, pre-op evaluation, timeout performed, IV checked, risks and benefits discussed and monitors and equipment checked  Epidural Patient position: sitting Prep: site prepped and draped and DuraPrep Patient monitoring: continuous pulse ox and blood pressure Approach: midline Injection technique: LOR air  Needle:  Needle type: Tuohy  Needle gauge: 17 G Needle length: 15 cm Needle insertion depth: 5 cm cm Catheter type: closed end flexible Catheter size: 19 Gauge Catheter at skin depth: 10 cm Test dose: negative and Other  Assessment Events: blood not aspirated, injection not painful, no injection resistance, negative IV test and no paresthesia  Additional Notes Patient identified. Risks and benefits discussed including failed block, incomplete  Pain control, post dural puncture headache, nerve damage, paralysis, blood pressure Changes, nausea, vomiting, reactions to medications-both toxic and allergic and post Partum back pain. All questions were answered. Patient expressed understanding and wished to proceed. Sterile technique was used throughout procedure. Epidural site was Dressed with sterile barrier dressing. Left leg paresthesia transient. No signs of intravascular injection Or signs of intrathecal spread were encountered.  Patient was more comfortable after the epidural was dosed. Multiple attempts. Difficult due to super morbid obesity and poor position. Please see RN's note for documentation of vital signs and FHR which are stable.

## 2011-12-06 NOTE — Transfer of Care (Signed)
Immediate Anesthesia Transfer of Care Note  Patient: Jill Odom  Procedure(s) Performed: Procedure(s) (LRB): CESAREAN SECTION (N/A)  Patient Location: PACU  Anesthesia Type: Epidural  Level of Consciousness: awake  Airway & Oxygen Therapy: Patient Spontanous Breathing  Post-op Assessment: Report given to PACU RN  Post vital signs: stable  Complications: No apparent anesthesia complications

## 2011-12-06 NOTE — Progress Notes (Signed)
Patient ID: Jill Odom, female   DOB: 08-22-1985, 26 y.o.   MRN: [id number] CTSP for persistent late decels.  Pt has been on pitocin and ctx never reached above 150 mvu but that tracing demonstrated persistent late decels and loss of variability. Pitocin turned off and decels improved but still with almost no variability.  D/w pt the baby's intolerance of labor remote from delivery.  I feel we need to proceed with c-section in controlled fashion given her morbid obesity, epidural is working well. D/w pt risks of bleeding infection and possible damage to bowel and bladder.  Pt desires to proceed.  OR notified and preparing.  Will give Ancef now.

## 2011-12-06 NOTE — Brief Op Note (Signed)
12/06/2011  5:17 PM  PATIENT:  Jill Odom  26 y.o. female  PRE-OPERATIVE DIAGNOSIS:  Term pregnancy at 40+ weeks                                                       Nonreassuring Fetal Heartrate  POST-OPERATIVE DIAGNOSIS:  Same with occult cord by head and nuchal cord x 1  PROCEDURE:  Procedure(s) (LRB): Low Transverse CESAREAN SECTION (N/A) with two layer closure of uterus  SURGEON:  Surgeon(s) and Role:    * Oliver Pila, MD - Primary   ANESTHESIA:   epidural  EBL:  Total I/O In: 1500 [I.V.:1500] Out: 950 [Urine:250; Blood:700]  BLOOD ADMINISTERED:none  DRAINS: Urinary Catheter (Foley)   LOCAL MEDICATIONS USED:  NONE  SPECIMEN: Placenta to L&D   COUNTS:  YES   DICTATION: .Dragon Dictation  PLAN OF CARE: Admit to inpatient   PATIENT DISPOSITION:  PACU - hemodynamically stable.

## 2011-12-06 NOTE — Progress Notes (Signed)
Patient ID: Jill Odom, female   DOB: [birth year], 26 y.o.   MRN: [id number] Pt having some painful contractions, coping well.  FHR has had periods of decreased variability but improves with position change and O2.  Intermittent variable decels, some with a late component but not consistent.  She also has intermittnent accels.  Tracing overall category 2.  Pt has declined epidural until now, but I have advised her to go ahead and have placed given that FHR is still intermittently having some decels.  She is agreeable.  WIll have epidural placed and then continue to increase pitocin.  Cervix with minimal change  70/3/-2 but MVU not adequate yet. Continue to follow closely and if tracing deteriorates will plan C/Section.

## 2011-12-06 NOTE — MAU Note (Signed)
Pt reports ROM at 0115, denies pain.

## 2011-12-06 NOTE — H&P (Signed)
Jill Odom, Jill Odom NO.:  [id number]  MEDICAL RECORD NO.:  [id number]  LOCATION:  9163                          FACILITY:  [facility]  PHYSICIAN:  Malachi Pro. Ambrose Mantle, M.D. DATE OF BIRTH:  May 13, 1986  DATE OF ADMISSION:  12/06/2011 DATE OF DISCHARGE:                             HISTORY & PHYSICAL   PRESENT ILLNESS:  This is a 26 year old black female, para 0, gravida 1, EDC December 14, 2011 by her last menstrual period, admitted with premature rupture of the membranes.  The patient's last menstrual period was March 02, 2011 which placed her due date at December 07, 2011, but an ultrasound on May 04, 2011 placed her due date at December 14, 2011 and that was the official due date, December 14, 2011.  The patient's prenatal course was complicated by morbid obesity.  BMI of 56 and group B strep positive.  Her lab data showed O positive blood group and type, negative antibody, negative Pap smear, rubella immune, RPR nonreactive, urine culture negative, hepatitis B surface antigen negative, HIV negative, GC and Chlamydia negative, hemoglobin electrophoresis AA.  A 1-hour Glucola was 138, group B strep was positive.  She did have a quad screen done that was negative.  An ultrasound done on July 19, 2011 placed her average ultrasound age at 19 weeks and 6 days, due date of December 14, 2011.  She underwent an ultrasound in late May for size greater than dates, but the ultrasound revealed average for gestational age infant and normal amniotic fluid volume.  Her blood pressure has remained normal.  She was in bed at approximately 1:30 am today when she thought she had urinated on herself, but she realized it was amniotic fluid. She did not notice the color of the fluid.  She came to the hospital and on initial evaluation by the nursing staff, was noted to have a yellowish discharge from the vagina, but fern was negative.  She underwent an ultrasound, which showed oligohydramnios and then  after the slide had set for an extended period time, fern was noted and it was noted that the patient had meconium-stained fluid.  She was begun on Pitocin, but even with 3 milliunits a minute of Pitocin, she began having some decelerations of the fetal heart rate, so the Pitocin was discontinued and she was given oxygen and positioned well.  With that set of maneuvers, the fetal heart rate became normal.  PAST MEDICAL HISTORY:  Soft drinks caused her throat to feel tight.  She has no latex allergy.  No known drug allergies.  She does have a history of exercise-induced asthma and eczema as a child.  She is morbidly obese and she has had pseudotumor cerebri and she has no known surgical history.  FAMILY HISTORY:  Mother has arthritis and hypertension.  Father has asthma and paternal grandmother has diabetes.  SOCIAL HISTORY:  The patient's educational history, she never graduated from college.  She denies alcohol, tobacco, and illicit substance abuse. She has been in a relationship for 2 years.  She works as a Child psychotherapist at [employer].  PHYSICAL EXAMINATION:  VITAL SIGNS:  On admission, temperature is 98, pulse is 93, respirations 18,  blood pressure 117/63. HEART:  Normal size and sounds.  No murmurs. LUNGS:  Clear to auscultation. ABDOMEN:  Soft.  At her last prenatal visit on Dec 02, 2011, the fundal height was 43 cm.  Her fetal heart tones at this point are normal, however she did have some decelerations with Pitocin, the contractions were not printing out, so the exact nature of the decelerations cannot be determined at this point.  ADMITTING IMPRESSION:  Intrauterine pregnancy at 38 weeks and 6 days, premature rupture of the membranes.  Morbid obesity.  Positive group B strep.  The patient is going to continue on penicillin and likely the Pitocin will be restarted.  At the present time, according to the nurses's exam, the cervix is-cm thick, vertex, and -3.     Malachi Pro. Ambrose Mantle,  M.D.     TFH/MEDQ  D:  12/06/2011  T:  12/06/2011  Job:  [id number]

## 2011-12-06 NOTE — Anesthesia Preprocedure Evaluation (Signed)
Anesthesia Evaluation  Patient identified by MRN, date of birth, ID band Patient awake    Reviewed: Allergy & Precautions, H&P , NPO status , Patient's Chart, lab work & pertinent test results  Airway Mallampati: III TM Distance: >3 FB Neck ROM: Full    Dental No notable dental hx. (+) Teeth Intact   Pulmonary asthma ,  breath sounds clear to auscultation  Pulmonary exam normal       Cardiovascular negative cardio ROS  Rhythm:Regular Rate:Normal     Neuro/Psych negative neurological ROS  negative psych ROS   GI/Hepatic negative GI ROS, Neg liver ROS,   Endo/Other  Morbid obesity  Renal/GU negative Renal ROS  negative genitourinary   Musculoskeletal negative musculoskeletal ROS (+)   Abdominal (+) + obese,  Abdomen: soft.    Peds  Hematology negative hematology ROS (+)   Anesthesia Other Findings   Reproductive/Obstetrics (+) Pregnancy                           Anesthesia Physical Anesthesia Plan  ASA: III  Anesthesia Plan: Epidural   Post-op Pain Management:    Induction:   Airway Management Planned: Natural Airway  Additional Equipment:   Intra-op Plan:   Post-operative Plan:   Informed Consent: I have reviewed the patients History and Physical, chart, labs and discussed the procedure including the risks, benefits and alternatives for the proposed anesthesia with the patient or authorized representative who has indicated his/her understanding and acceptance.     Plan Discussed with: CRNA, Anesthesiologist and Surgeon  Anesthesia Plan Comments:         Anesthesia Quick Evaluation

## 2011-12-06 NOTE — MAU Note (Signed)
Pt to 163 per wheelchair for eval. Pt with wet towel between her legs, reports ROM at 0115.

## 2011-12-07 ENCOUNTER — Encounter (HOSPITAL_COMMUNITY): Payer: Self-pay | Admitting: Obstetrics and Gynecology

## 2011-12-07 LAB — CBC
HCT: 25.4 % — ABNORMAL LOW (ref 36.0–46.0)
RDW: 16.5 % — ABNORMAL HIGH (ref 11.5–15.5)
WBC: 11.5 10*3/uL — ABNORMAL HIGH (ref 4.0–10.5)

## 2011-12-07 MED ORDER — PNEUMOCOCCAL VAC POLYVALENT 25 MCG/0.5ML IJ INJ
0.5000 mL | INJECTION | Freq: Once | INTRAMUSCULAR | Status: AC
  Administered 2011-12-08: 0.5 mL via INTRAMUSCULAR
  Filled 2011-12-07 (×2): qty 0.5

## 2011-12-07 NOTE — Progress Notes (Signed)
UR chart review completed.  

## 2011-12-07 NOTE — Anesthesia Postprocedure Evaluation (Signed)
  Anesthesia Post Note  Patient: Jill Odom  Procedure(s) Performed: Procedure(s) (LRB): CESAREAN SECTION (N/A)  Anesthesia type: Epidural  Patient location: Mother/Baby  Post pain: Pain level controlled  Post assessment: Post-op Vital signs reviewed  Last Vitals:  Filed Vitals:   12/07/11 0659  BP:   Pulse:   Temp:   Resp: 26    Post vital signs: Reviewed  Level of consciousness:alert  Complications: No apparent anesthesia complications

## 2011-12-07 NOTE — Addendum Note (Signed)
Addendum  created 12/07/11 2956 by Algis Greenhouse, CRNA   Modules edited:Charges VN, Notes Section

## 2011-12-07 NOTE — Progress Notes (Signed)
Subjective: Postpartum Day 1: Cesarean Delivery Patient reports tolerating PO.  Pt on a PCA for incisional pain, wants to change to po.  Foley still in.  Objective: Vital signs in last 24 hours: Temp:  [96.3 F (35.7 C)-98 F (36.7 C)] 97.5 F (36.4 C) (06/04 0827) Pulse Rate:  [66-110] 82  (06/04 0827) Resp:  [16-97] 28  (06/04 0827) BP: (76-116)/(40-103) 95/64 mmHg (06/04 0827) SpO2:  [87 %-100 %] 98 % (06/04 0827)  Physical Exam:  General: alert Lochia: appropriate Uterine Fundus: firm Incision:C/D/I    Basename 12/07/11 0534 12/06/11 0452  HGB 7.6* 8.8*  HCT 25.4* 28.7*    Assessment/Plan: Status post Cesarean section. Doing well postoperatively.  Continue current care.   D/c PCA and transition to po meds  Rachyl Wuebker W 12/07/2011, 10:51 AM

## 2011-12-08 NOTE — Anesthesia Postprocedure Evaluation (Signed)
  Anesthesia Post Note  Patient: Jill Odom  Procedure(s) Performed: Procedure(s) (LRB): CESAREAN SECTION (N/A)  Anesthesia type: Epidural  Patient location: Mother/Baby  Post pain: Pain level controlled  Post assessment: Post-op Vital signs reviewed  Last Vitals:  Filed Vitals:   12/08/11 0610  BP: 97/64  Pulse: 92  Temp: 36.8 C  Resp: 20    Post vital signs: Reviewed  Level of consciousness:alert  Complications: No apparent anesthesia complications

## 2011-12-08 NOTE — Progress Notes (Signed)
Subjective: Postpartum Day 2: Cesarean Delivery Patient reports tolerating PO and no problems voiding.    Objective: Vital signs in last 24 hours: Temp:  [97.8 F (36.6 C)-98.3 F (36.8 C)] 98.3 F (36.8 C) (06/05 0610) Pulse Rate:  [72-92] 92  (06/05 0610) Resp:  [20-22] 20  (06/05 0610) BP: (97-126)/(64-74) 97/64 mmHg (06/05 0610)  Physical Exam:  General: alert Lochia: appropriate Uterine Fundus: firm Incision: healing well    Basename 12/07/11 0534 12/06/11 0452  HGB 7.6* 8.8*  HCT 25.4* 28.7*    Assessment/Plan: Status post Cesarean section. Doing well postoperatively.  Continue current care. Baby in NICU with low BS but doing better  Janira Mandell W 12/08/2011, 12:37 PM

## 2011-12-08 NOTE — Addendum Note (Signed)
Addendum  created 12/08/11 1249 by Algis Greenhouse, CRNA   Modules edited:Charges VN, Notes Section

## 2011-12-09 NOTE — Progress Notes (Signed)
Subjective: Postpartum Day 4: Cesarean Delivery Patient reports incisional pain, tolerating PO and no problems voiding.    Objective: Vital signs in last 24 hours: Temp:  [97.3 F (36.3 C)-98.2 F (36.8 C)] 97.3 F (36.3 C) (06/06 0617) Pulse Rate:  [85-103] 88  (06/06 0617) Resp:  [20] 20  (06/06 0617) BP: (94-124)/(61-71) 124/71 mmHg (06/06 0617) SpO2:  [100 %] 100 % (06/06 0410)  Physical Exam:  General: alert Lochia: appropriate Uterine Fundus: firm Incision: healing well, no significant erythema    Basename 12/07/11 0534  HGB 7.6*  HCT 25.4*    Assessment/Plan: Status post Cesarean section. Doing well postoperatively.  Continue current care. Baby still in NICU, doing better, pt unsure if will be ready for d/c tomorrow or not, but wants to stay with baby for one more day. Oliver Pila 12/09/2011, 8:37 AM

## 2011-12-10 MED ORDER — OXYCODONE-ACETAMINOPHEN 5-325 MG PO TABS: 1.0000 | ORAL_TABLET | ORAL | Status: AC | PRN

## 2011-12-10 MED ORDER — IBUPROFEN 600 MG PO TABS: 600.0000 mg | ORAL_TABLET | Freq: Four times a day (QID) | ORAL | Status: AC

## 2011-12-10 NOTE — Progress Notes (Signed)
Subjective: Postpartum Day 4 Cesarean Delivery Patient reports incisional pain, tolerating PO and no problems voiding.    Objective: Vital signs in last 24 hours: Temp:  [97.4 F (36.3 C)-97.7 F (36.5 C)] 97.4 F (36.3 C) (06/07 0644) Pulse Rate:  [80-96] 80  (06/07 0644) Resp:  [18-30] 19  (06/07 0644) BP: (115-125)/(68-82) 122/81 mmHg (06/07 1610)  Physical Exam:  General: alert and cooperative Lochia: appropriate Uterine Fundus: firm Incision: healing well, no significant erythema  Assessment/Plan: Status post Cesarean section. Doing well postoperatively.  Discharge home with standard precautions and return to clinic in 2 weeks for incision check. Motrin and percocet.  Jill Odom 12/10/2011, 8:45 AM

## 2011-12-10 NOTE — Discharge Summary (Signed)
Obstetric Discharge Summary Reason for Admission: rupture of membranes Prenatal Procedures: none Intrapartum Procedures: cesarean: low cervical, transverse Postpartum Procedures: none Complications-Operative and Postpartum: none Hemoglobin  Date Value Range Status  12/07/2011 7.6* 12.0-15.0 (g/dL) Final     HCT  Date Value Range Status  12/07/2011 25.4* 36.0-46.0 (%) Final    Physical Exam:  General: alert Lochia: appropriate Uterine Fundus: firm Incision: healing well, no significant erythema   Discharge Diagnoses: Term Pregnancy-delivered                                         NRFHR with occult cord  Discharge Information: Date: 12/10/2011 Activity: pelvic rest Diet: routine Medications: Ibuprofen and Percocet Condition: improved Instructions: refer to practice specific booklet Discharge to: home   Newborn Data: Live born female  Birth Weight: 8 lb 1.8 oz (3680 g) APGAR: 8, 9  Home with in NICU with d/c pending.  Jill Odom 12/10/2011, 8:47 AM

## 2011-12-12 ENCOUNTER — Observation Stay (HOSPITAL_COMMUNITY)
Admission: EM | Admit: 2011-12-12 | Discharge: 2011-12-13 | Disposition: A | Discharge status: Home / Self Care | Payer: Medicaid Other | Attending: Internal Medicine | Admitting: Internal Medicine

## 2011-12-12 ENCOUNTER — Encounter (HOSPITAL_COMMUNITY): Payer: Self-pay | Admitting: Internal Medicine

## 2011-12-12 ENCOUNTER — Emergency Department (HOSPITAL_COMMUNITY): Payer: Medicaid Other

## 2011-12-12 DIAGNOSIS — Z6841 Body Mass Index (BMI) 40.0 and over, adult: Secondary | ICD-10-CM | POA: Insufficient documentation

## 2011-12-12 DIAGNOSIS — D509 Iron deficiency anemia, unspecified: Secondary | ICD-10-CM | POA: Insufficient documentation

## 2011-12-12 DIAGNOSIS — G932 Benign intracranial hypertension: Secondary | ICD-10-CM | POA: Insufficient documentation

## 2011-12-12 DIAGNOSIS — R0602 Shortness of breath: Secondary | ICD-10-CM | POA: Diagnosis present

## 2011-12-12 DIAGNOSIS — O9089 Other complications of the puerperium, not elsewhere classified: Principal | ICD-10-CM | POA: Insufficient documentation

## 2011-12-12 DIAGNOSIS — D649 Anemia, unspecified: Secondary | ICD-10-CM | POA: Diagnosis present

## 2011-12-12 DIAGNOSIS — O9081 Anemia of the puerperium: Secondary | ICD-10-CM | POA: Insufficient documentation

## 2011-12-12 DIAGNOSIS — R0600 Dyspnea, unspecified: Secondary | ICD-10-CM

## 2011-12-12 DIAGNOSIS — R112 Nausea with vomiting, unspecified: Secondary | ICD-10-CM

## 2011-12-12 LAB — CBC
HCT: 23.4 % — ABNORMAL LOW (ref 36.0–46.0)
Hemoglobin: 7.3 g/dL — ABNORMAL LOW (ref 12.0–15.0)
MCH: 25.8 pg — ABNORMAL LOW (ref 26.0–34.0)
MCH: 25.8 pg — ABNORMAL LOW (ref 26.0–34.0)
Platelets: 441 10*3/uL — ABNORMAL HIGH (ref 150–400)
RBC: 3.02 MIL/uL — ABNORMAL LOW (ref 3.87–5.11)
RBC: 2.83 MIL/uL — ABNORMAL LOW (ref 3.87–5.11)
RDW: 16.9 % — ABNORMAL HIGH (ref 11.5–15.5)

## 2011-12-12 LAB — COMPREHENSIVE METABOLIC PANEL
ALT: 13 U/L (ref 0–35)
Albumin: 2.3 g/dL — ABNORMAL LOW (ref 3.5–5.2)
BUN: 11 mg/dL (ref 6–23)
CO2: 21 mEq/L (ref 19–32)
Calcium: 9.4 mg/dL (ref 8.4–10.5)
Calcium: 9 mg/dL (ref 8.4–10.5)
Creatinine, Ser: 0.61 mg/dL (ref 0.50–1.10)
GFR calc Af Amer: >90 mL/min (ref >90)
GFR calc Af Amer: >90 mL/min (ref >90)
GFR calc non Af Amer: >90 mL/min (ref >90)
Glucose, Bld: 76 mg/dL (ref 70–99)
Glucose, Bld: 84 mg/dL (ref 70–99)
Sodium: 139 mEq/L (ref 135–145)
Sodium: 138 mEq/L (ref 135–145)
Total Protein: 6.9 g/dL (ref 6.0–8.3)
Total Protein: 6.6 g/dL (ref 6.0–8.3)

## 2011-12-12 LAB — PROTIME-INR: Prothrombin Time: 13.3 seconds (ref 11.6–15.2)

## 2011-12-12 LAB — URINALYSIS, ROUTINE W REFLEX MICROSCOPIC
Bilirubin Urine: NEGATIVE
Ketones, ur: NEGATIVE mg/dL
Protein, ur: NEGATIVE mg/dL
Urobilinogen, UA: 1 mg/dL (ref 0.0–1.0)

## 2011-12-12 LAB — RETICULOCYTES
RBC.: 2.95 MIL/uL — ABNORMAL LOW (ref 3.87–5.11)
Retic Count, Absolute: 47.2 10*3/uL (ref 19.0–186.0)

## 2011-12-12 LAB — URINE MICROSCOPIC-ADD ON

## 2011-12-12 LAB — TYPE AND SCREEN: Antibody Screen: NEGATIVE

## 2011-12-12 LAB — TSH: TSH: 1.994 u[IU]/mL (ref 0.350–4.500)

## 2011-12-12 LAB — HEMOGLOBIN AND HEMATOCRIT, BLOOD: Hemoglobin: 7.1 g/dL — ABNORMAL LOW (ref 12.0–15.0)

## 2011-12-12 LAB — HEPARIN LEVEL (UNFRACTIONATED): Heparin Unfractionated: 0.32 IU/mL (ref 0.30–0.70)

## 2011-12-12 LAB — TROPONIN I: Troponin I: <0.3 ng/mL (ref <0.30)

## 2011-12-12 LAB — CARDIAC PANEL(CRET KIN+CKTOT+MB+TROPI): Relative Index: 2.2 (ref 0.0–2.5)

## 2011-12-12 MED ORDER — DEXTROSE 5 % IV SOLN: 1.0000 g | INTRAVENOUS | Status: DC

## 2011-12-12 MED ORDER — FUROSEMIDE 10 MG/ML IJ SOLN
20.0000 mg | Freq: Once | INTRAMUSCULAR | Status: AC
  Administered 2011-12-12: 20 mg via INTRAVENOUS
  Filled 2011-12-12: qty 4

## 2011-12-12 MED ORDER — DEXTROSE 5 % IV SOLN: 500.0000 mg | INTRAVENOUS | Status: DC

## 2011-12-12 MED ORDER — DEXTROSE 5 % IV SOLN
1.0000 g | Freq: Once | INTRAVENOUS | Status: AC
  Administered 2011-12-12: 1 g via INTRAVENOUS
  Filled 2011-12-12: qty 10

## 2011-12-12 MED ORDER — AZITHROMYCIN 250 MG PO TABS
250.0000 mg | ORAL_TABLET | Freq: Every day | ORAL | Status: DC
  Administered 2011-12-13: 250 mg via ORAL
  Filled 2011-12-12: qty 1

## 2011-12-12 MED ORDER — POTASSIUM CHLORIDE CRYS ER 20 MEQ PO TBCR
20.0000 meq | EXTENDED_RELEASE_TABLET | Freq: Once | ORAL | Status: AC
  Administered 2011-12-12: 20 meq via ORAL
  Filled 2011-12-12: qty 1

## 2011-12-12 MED ORDER — ACETAMINOPHEN 650 MG RE SUPP: 650.0000 mg | Freq: Four times a day (QID) | RECTAL | Status: DC | PRN

## 2011-12-12 MED ORDER — FUROSEMIDE 10 MG/ML IJ SOLN
20.0000 mg | Freq: Once | INTRAMUSCULAR | Status: AC
  Administered 2011-12-12: 20 mg via INTRAVENOUS
  Filled 2011-12-12: qty 2

## 2011-12-12 MED ORDER — ALBUTEROL SULFATE (5 MG/ML) 0.5% IN NEBU
5.0000 mg | INHALATION_SOLUTION | Freq: Once | RESPIRATORY_TRACT | Status: AC
  Administered 2011-12-12: 5 mg via RESPIRATORY_TRACT
  Filled 2011-12-12: qty 1

## 2011-12-12 MED ORDER — HEPARIN BOLUS VIA INFUSION
2500.0000 [IU] | Freq: Once | INTRAVENOUS | Status: AC
  Administered 2011-12-12: 2500 [IU] via INTRAVENOUS

## 2011-12-12 MED ORDER — IOHEXOL 300 MG/ML  SOLN
100.0000 mL | Freq: Once | INTRAMUSCULAR | Status: AC | PRN
  Administered 2011-12-12: 100 mL via INTRAVENOUS

## 2011-12-12 MED ORDER — ONDANSETRON HCL 4 MG PO TABS: 4.0000 mg | ORAL_TABLET | Freq: Four times a day (QID) | ORAL | Status: DC | PRN

## 2011-12-12 MED ORDER — ACETAMINOPHEN 325 MG PO TABS: 650.0000 mg | ORAL_TABLET | Freq: Four times a day (QID) | ORAL | Status: DC | PRN

## 2011-12-12 MED ORDER — OXYCODONE-ACETAMINOPHEN 5-325 MG PO TABS
1.0000 | ORAL_TABLET | ORAL | Status: DC | PRN
  Administered 2011-12-12 (×2): 2 via ORAL
  Filled 2011-12-12 (×2): qty 2

## 2011-12-12 MED ORDER — ONDANSETRON HCL 4 MG/2ML IJ SOLN: 4.0000 mg | Freq: Four times a day (QID) | INTRAMUSCULAR | Status: DC | PRN

## 2011-12-12 MED ORDER — SODIUM CHLORIDE 0.9 % IJ SOLN: 3.0000 mL | Freq: Two times a day (BID) | INTRAMUSCULAR | Status: DC

## 2011-12-12 MED ORDER — SODIUM CHLORIDE 0.9 % IJ SOLN
3.0000 mL | Freq: Two times a day (BID) | INTRAMUSCULAR | Status: DC
  Administered 2011-12-12 – 2011-12-13 (×3): 3 mL via INTRAVENOUS

## 2011-12-12 MED ORDER — HEPARIN (PORCINE) IN NACL 100-0.45 UNIT/ML-% IJ SOLN
1900.0000 [IU]/h | INTRAMUSCULAR | Status: DC
  Administered 2011-12-12: 1900 [IU]/h via INTRAVENOUS
  Filled 2011-12-12 (×2): qty 250

## 2011-12-12 MED ORDER — AZITHROMYCIN 500 MG IV SOLR
500.0000 mg | Freq: Once | INTRAVENOUS | Status: AC
  Administered 2011-12-12: 500 mg via INTRAVENOUS
  Filled 2011-12-12: qty 500

## 2011-12-12 NOTE — Progress Notes (Addendum)
PHARMACY CONSULT NOTE - MEDICATIONS AND BREASTFEEDING  Asked to review both the current medications as well as potential anticoagulant medications for safety during breast feeding.  The patient is a 26 year-old female who had a C-section one week ago.  The following references were consulted:  Medications and Mothers' Milk by Stevan Born and Micromedex 2.0.    Antibiotics: Azithromycin - Both references state that the risk to infants is "minimal" or "not clinically relevant" during breastfeeding.   The American Academy of Pediatrics (AAP) has not reviewed this medication.  Ceftriaxone - This medication is rated as "usually compatible with breastfeeding" by the AAP, and is commonly used in neonates.  There may be changes in the GI flora, and infants should be observed for GI symptoms such as diarrhea.  Both antibiotics are given the "L2" safety rating by Medications and Mothers' Milk - for drugs "which have been studied in a limited number of breastfeeding women without an increase in adverse effects in the infant.  And/or, the evidence of a demonstrated risk which is likely to follow use of this medication in a breastfeeding woman is remote."  Anticoagulants: Heparin - Micromedex states "Infant risk is minimal."  Medications and Mothers' Milk adds the following:  "Due to its high molecular weight, it is unlikely any would transfer into breast milk.  Any that did enter the milk would be rapidly destroyed in the gastric contents of the infant."  Heparin was assigned the highest safety rating ("L1") by this reference.  The AAP has not reviewed Heparin.  Warfarin - The AAP has rated this medication as "usually compatible with breast feeding."  According to Medications and Mothers' Milk, "Warfarin is highly protein bound in the maternal circulation and therefore very little is secreted into human milk.  Very small and insignificant amounts are secreted into milk but it depends to some degree on the dose  administered. " The rating is "L2."  Infants should be observed for signs of bleeding, such as excessive bruising or petechia.    Lovenox -  Micromedex rates this medication as "Infant risk cannot be ruled out," a lower rating than for the others.  Medications and Mothers' Milk rates as "L3 - moderately safe," and comments that its large molecular weight "would largely preclude its entry into human milk at levels clinically relevant.  Due to minimal oral bioavailability, any present in milk would not be orally absorbed by the infant."  This medication has not been reviewed by the AAP.  Polo Riley R.Ph. 12/12/2011  9:28 AM

## 2011-12-12 NOTE — ED Provider Notes (Signed)
History     CSN: [id number]  Arrival date & time 12/12/11  0016   First MD Initiated Contact with Patient 12/12/11 0020      Chief Complaint  Patient presents with  . Cough  . Shortness of Breath     The history is provided by the patient.   the patient reports acute onset shortness of breath and dry cough that began approximately one hour ago.  She has had some orthopnea over the past several days.  The patient is 5 days postpartum status post C-section.  The baby is currently in the MICU.  She reports ongoing bilateral lower extremity swelling since her C-section.  She denies prior history of DVT or pulmonary embolism.  She reports she was in normal state of health until earlier today.  She denies unilateral leg swelling.  She does have a history of asthma she tried her albuterol inhaler at home without improvement in her symptoms.  She was brought to the ER by EMS, no treatment in route.  Her symptoms are moderate at this time.  Nothing worsens or improves her symptoms  Past Medical History  Diagnosis Date  . Asthma     Past Surgical History  Procedure Date  . Cesarean section 12/06/2011    Procedure: CESAREAN SECTION;  Surgeon: Oliver Pila, MD;  Location: [facility];  Service: Gynecology;  Laterality: N/A;    No family history on file.  History  Substance Use Topics  . Smoking status: Never Smoker   . Smokeless tobacco: Not on file  . Alcohol Use: No    OB History    Grav Para Term Preterm Abortions TAB SAB Ect Mult Living   1 1 1  0 0 0 0 0 0 1      Review of Systems  Respiratory: Positive for cough and shortness of breath.   All other systems reviewed and are negative.    Allergies  Review of patient's allergies indicates no known allergies.  Home Medications   Current Outpatient Rx  Name Route Sig Dispense Refill  . AZITHROMYCIN 250 MG PO TABS Oral Take 250 mg by mouth daily.    . IBUPROFEN 600 MG PO TABS Oral Take 1 tablet (600 mg total) by mouth every  6 (six) hours. 30 tablet 1  . OXYCODONE-ACETAMINOPHEN 5-325 MG PO TABS Oral Take 1-2 tablets by mouth every 3 (three) hours as needed (moderate - severe pain). 30 tablet 0    LMP 01/04/2011  Breastfeeding? Unknown  Physical Exam  Nursing note and vitals reviewed. Constitutional: She is oriented to person, place, and time. She appears well-developed and well-nourished. No distress.  HENT:  Head: Normocephalic and atraumatic.  Eyes: EOM are normal.  Neck: Normal range of motion.  Cardiovascular: Regular rhythm and normal heart sounds.        Tachycardic  Pulmonary/Chest: Effort normal and breath sounds normal.  Abdominal: Soft. She exhibits no distension. There is no tenderness.       Low transverse C-section incision without secondary signs of infection.  Nontender.  Steri-Strips in place  Musculoskeletal: Normal range of motion.       2+ pitting edema bilaterally  Neurological: She is alert and oriented to person, place, and time.  Skin: Skin is warm and dry.  Psychiatric: She has a normal mood and affect. Judgment normal.    ED Course  Procedures (including critical care time)   Date: 12/12/2011  Rate: 104  Rhythm: Sinus tachycardia  QRS Axis: normal  Intervals: normal  ST/T Wave abnormalities: normal  Conduction Disutrbances: none  Narrative Interpretation:   Old EKG Reviewed: No significant changes noted     Labs Reviewed  CBC - Abnormal; Notable for the following:    RBC 3.02 (*)    Hemoglobin 7.8 (*)    HCT 25.0 (*)    MCH 25.8 (*)    RDW 16.9 (*)    Platelets 441 (*)    All other components within normal limits  PRO B NATRIURETIC PEPTIDE - Abnormal; Notable for the following:    Pro B Natriuretic peptide (BNP) 323.2 (*)    All other components within normal limits  URINALYSIS, ROUTINE W REFLEX MICROSCOPIC - Abnormal; Notable for the following:    APPearance CLOUDY (*)    Hgb urine dipstick LARGE (*)    Nitrite POSITIVE (*)    Leukocytes, UA LARGE (*)      All other components within normal limits  COMPREHENSIVE METABOLIC PANEL - Abnormal; Notable for the following:    Albumin 2.4 (*)    Alkaline Phosphatase 127 (*)    Total Bilirubin 0.2 (*)    All other components within normal limits  URINE MICROSCOPIC-ADD ON - Abnormal; Notable for the following:    Squamous Epithelial / LPF FEW (*)    Bacteria, UA MANY (*)    All other components within normal limits  TROPONIN I  APTT  PROTIME-INR  HEPARIN LEVEL (UNFRACTIONATED)  D-DIMER, QUANTITATIVE   Ct Angio Chest W/cm &/or Wo Cm  12/12/2011  *RADIOLOGY REPORT*  Clinical Data: Cough, shortness of breath  CT ANGIOGRAPHY CHEST  Technique:  Multidetector CT imaging of the chest using the standard protocol during bolus administration of intravenous contrast. Multiplanar reconstructed images including MIPs were obtained and reviewed to evaluate the vascular anatomy.  Contrast: OMNIPAQUE IOHEXOL 300 MG/ML  SOLN  Comparison: 12/12/2011 radiograph  Findings: Suboptimal contrast bolus timing.  No main branch pulmonary arterial filling defect.  Lobar and more peripheral branches are not adequately opacified.  Cardiomegaly.  No pleural or pericardial effusion.  Prominent anterior mediastinal soft tissue.  Limited images through the upper abdomen demonstrate no acute abnormality.  Degraded by respiratory motion.  Central airways are patent.  Mild patchy opacity within the lower lobes, left greater than right, and mild interlobular septal prominence particularly in the left lower lobe. No pneumothorax.  No acute osseous finding.  IMPRESSION:  No main branch pulmonary embolism.  Lobar and more peripheral branches are poorly opacified, limiting evaluation.  Cardiomegaly. Patchy bibasilar opacities and interlobular septal prominence; favored to reflect edema or atypical infection.  Prominent anterior mediastinal soft tissue may reflect residual thymus.  Consider 43-month follow-up.  Original Report Authenticated By:  Waneta Martins, M.D.   Dg Chest Portable 1 View  12/12/2011  *RADIOLOGY REPORT*  Clinical Data: Cough, shortness of breath  PORTABLE CHEST - 1 VIEW  Comparison: 01/27/2011  Findings: Cardiomegaly.  Central vascular congestion.  Interstitial prominence.  Mild lung base opacities.  No pneumothorax.  Cannot exclude small pleural effusions.  No acute osseous finding.  IMPRESSION: Cardiomegaly with central vascular congestion and interstitial edema pattern.  Bibasilar opacities; atelectasis versus infiltrate.  Original Report Authenticated By: Waneta Martins, M.D.     1. Dyspnea       MDM  The patient's story is concerning for pulmonary embolus given her severe shortness of breath and acute onset nature of her symptoms.  This may also represent postpartum cardiomyopathy as she does have lower extremity  edema and possibly some edema on x-ray.  She was in the hospital for less than 5 days this should be covered for possible community-acquired pneumonia.  Her store was concerning enough that she was started on heparin early in her ER course.  CT scan of her chest shows no large pulmonary emboli however motion artifact in bolus timing were poor and therefore the lower lung fields are unable to be evaluated.  Old with patient the hospital.  At the should benefit from echocardiogram, lower committees duplexes and possible either repeat CT imaging of her chest and/or VQ scan.  I spoke with the hospitalist will limit the patient.  Her hemoglobin is baseline for her at this time and has not dropped in the last week.  I don't think this is symptomatic anemia.  Her cough is significantly improved after albuterol treatment and some of this may represent bronchospasm.        Lyanne Co, MD 12/12/11 0530

## 2011-12-12 NOTE — ED Notes (Signed)
Attempted to call report

## 2011-12-12 NOTE — Progress Notes (Signed)
Pharmacy: Heparin  Spoke with Dr. Thedore Mins regarding Hgb 7.3.  Will hold heparin for now and get stat H/H.   Lexxie Winberg, [place] Messing PharmD 12:38 PM 12/12/2011

## 2011-12-12 NOTE — ED Notes (Signed)
Pt alert, nad, arrives via EMS, post C-section last Monday, per EMS called for Anxiety attack, resp even unlabored, dry non productive cough, family bedside, son remains in NICU

## 2011-12-12 NOTE — Progress Notes (Signed)
  Echocardiogram 2D Echocardiogram has been performed.  Jill Odom [facility] 12/12/2011, 11:09 AM

## 2011-12-12 NOTE — Progress Notes (Signed)
VASCULAR LAB PRELIMINARY  PRELIMINARY  PRELIMINARY  PRELIMINARY  Bilateral lower extremity venous Dopplers completed.    Preliminary report:  There is no obvious evidence of DVT or SVT noted in the bilateral lower extremities.  Sherren Kerns [place], 12/12/2011, 11:56 AM

## 2011-12-12 NOTE — Progress Notes (Signed)
ANTICOAGULATION CONSULT NOTE - Initial Consult  Pharmacy Consult for Heparin Indication: pulmonary embolus  No Known Allergies  Patient Measurements: Height: 5' 3.39" (161 cm) Weight: 326 lb 15.1 oz (148.3 kg) IBW/kg (Calculated) : 53.29  Heparin Dosing Weight: 91.1 kg   Vital Signs: Temp: 98.8 F (37.1 C) (06/09 0021) Temp src: Oral (06/09 0021) BP: 145/85 mmHg (06/09 0148) Pulse Rate: 101  (06/09 0148)  Labs:  Basename 12/12/11 0042  HGB 7.8*  HCT 25.0*  PLT 441*  APTT --  LABPROT --  INR --  HEPARINUNFRC --  CREATININE 0.61  CKTOTAL --  CKMB --  TROPONINI <0.30    Estimated Creatinine Clearance: 154.9 ml/min (by C-G formula based on Cr of 0.61).   Medical History: Past Medical History  Diagnosis Date  . Asthma     Medications:  Scheduled:    . albuterol  5 mg Nebulization Once  . heparin  2,500 Units Intravenous Once   Infusions:    . heparin      Assessment:  26 year old morbidly obese patient s/p C-section 12/06/11  Patient with complaint of shortness of breath and cough  IV heparin to begin for PE  Smaller bolus than recommended [place] bolus due to recent surgery  Obtain baseline PTT, PT/INR. Baseline CBC has been obtained Goal of Therapy:  Heparin level 0.3-0.7 units/ml Monitor platelets by anticoagulation protocol: Yes   Plan:   Draw baseline PTT, PT/INR  Heparin 2500 unit bolus IV x 1 then heparin drip @ 1900 units/hr  Check heparin level 6 hours after heparin started  Check daily heparin level & CBC  Prayan Ulin Trefz 12/12/2011,2:00 AM

## 2011-12-12 NOTE — H&P (Signed)
Jill Odom is an 26 y.o. female.   PCP - Dr.Meisinger. Chief Complaint: Shortness of breath. HPI: 26 year-old female who had a cesarean section one week ago presented to the ER because of sudden onset of shortness of breath last night. Patient states she had gone to the bed and woke up with productive cough. The sputum was white in color. Denies any fever chills. Then patient went to the bathroom and urinated and came back when she started to get short of breath along with her productive cough. At that point she decided to come to the ER. In the ER patient had a chest x-ray later followed by CT angiogram of the chest. The CT angiogram of the chest did not show any central pulmonary emboli. Did show some atelectasis versus infiltrates concerning for atypical infection. Does also show possibility of edema and cardiomegaly. Patient denies any chest pain. Patient has been having lower extremity edema since she got pregnant. For the last few days it has worsened. She was told that it will eventually get better and was a reaction to one of the medicine she got.  Past Medical History  Diagnosis Date  . Asthma     Past Surgical History  Procedure Date  . Cesarean section 12/06/2011    Procedure: CESAREAN SECTION;  Surgeon: Oliver Pila, MD;  Location: [facility];  Service: Gynecology;  Laterality: N/A;    No family history on file. Social History:  reports that she has never smoked. She does not have any smokeless tobacco history on file. She reports that she does not drink alcohol or use illicit drugs.  Allergies: No Known Allergies   (Not in a hospital admission)  Results for orders placed during the hospital encounter of 12/12/11 (from the past 48 hour(s))  APTT     Status: Normal   Collection Time   12/12/11 12:30 AM      Component Value Range Comment   aPTT 32  24 - 37 (seconds)   PROTIME-INR     Status: Normal   Collection Time   12/12/11 12:30 AM      Component Value Range Comment   Prothrombin Time 13.3  11.6 - 15.2 (seconds)    INR 0.99  0.00 - 1.49    CBC     Status: Abnormal   Collection Time   12/12/11 12:42 AM      Component Value Range Comment   WBC 8.2  4.0 - 10.5 (K/uL)    RBC 3.02 (*) 3.87 - 5.11 (MIL/uL)    Hemoglobin 7.8 (*) 12.0 - 15.0 (g/dL)    HCT 96.0 (*) 45.4 - 46.0 (%)    MCV 82.8  78.0 - 100.0 (fL)    MCH 25.8 (*) 26.0 - 34.0 (pg)    MCHC 31.2  30.0 - 36.0 (g/dL)    RDW 09.8 (*) 11.9 - 15.5 (%)    Platelets 441 (*) 150 - 400 (K/uL)   TROPONIN I     Status: Normal   Collection Time   12/12/11 12:42 AM      Component Value Range Comment   Troponin I <0.30  <0.30 (ng/mL)   PRO B NATRIURETIC PEPTIDE     Status: Abnormal   Collection Time   12/12/11 12:42 AM      Component Value Range Comment   Pro B Natriuretic peptide (BNP) 323.2 (*) 0 - 125 (pg/mL)   COMPREHENSIVE METABOLIC PANEL     Status: Abnormal   Collection Time  12/12/11 12:42 AM      Component Value Range Comment   Sodium 139  135 - 145 (mEq/L)    Potassium 3.9  3.5 - 5.1 (mEq/L)    Chloride 109  96 - 112 (mEq/L)    CO2 21  19 - 32 (mEq/L)    Glucose, Bld 76  70 - 99 (mg/dL)    BUN 11  6 - 23 (mg/dL)    Creatinine, Ser 1.30  0.50 - 1.10 (mg/dL)    Calcium 9.4  8.4 - 10.5 (mg/dL)    Total Protein 6.9  6.0 - 8.3 (g/dL)    Albumin 2.4 (*) 3.5 - 5.2 (g/dL)    AST 22  0 - 37 (U/L)    ALT 13  0 - 35 (U/L)    Alkaline Phosphatase 127 (*) 39 - 117 (U/L)    Total Bilirubin 0.2 (*) 0.3 - 1.2 (mg/dL)    GFR calc non Af Amer >90  >90 (mL/min)    GFR calc Af Amer >90  >90 (mL/min)   URINALYSIS, ROUTINE W REFLEX MICROSCOPIC     Status: Abnormal   Collection Time   12/12/11  1:27 AM      Component Value Range Comment   Color, Urine YELLOW  YELLOW     APPearance CLOUDY (*) CLEAR     Specific Gravity, Urine 1.027  1.005 - 1.030     pH 6.5  5.0 - 8.0     Glucose, UA NEGATIVE  NEGATIVE (mg/dL)    Hgb urine dipstick LARGE (*) NEGATIVE     Bilirubin Urine NEGATIVE  NEGATIVE     Ketones, ur  NEGATIVE  NEGATIVE (mg/dL)    Protein, ur NEGATIVE  NEGATIVE (mg/dL)    Urobilinogen, UA 1.0  0.0 - 1.0 (mg/dL)    Nitrite POSITIVE (*) NEGATIVE     Leukocytes, UA LARGE (*) NEGATIVE    URINE MICROSCOPIC-ADD ON     Status: Abnormal   Collection Time   12/12/11  1:27 AM      Component Value Range Comment   Squamous Epithelial / LPF FEW (*) RARE     WBC, UA TOO NUMEROUS TO COUNT  <3 (WBC/hpf) WBC CLUMPS   RBC / HPF 3-6  <3 (RBC/hpf)    Bacteria, UA MANY (*) RARE     Urine-Other MUCOUS PRESENT      Ct Angio Chest W/cm &/or Wo Cm  12/12/2011  *RADIOLOGY REPORT*  Clinical Data: Cough, shortness of breath  CT ANGIOGRAPHY CHEST  Technique:  Multidetector CT imaging of the chest using the standard protocol during bolus administration of intravenous contrast. Multiplanar reconstructed images including MIPs were obtained and reviewed to evaluate the vascular anatomy.  Contrast: OMNIPAQUE IOHEXOL 300 MG/ML  SOLN  Comparison: 12/12/2011 radiograph  Findings: Suboptimal contrast bolus timing.  No main branch pulmonary arterial filling defect.  Lobar and more peripheral branches are not adequately opacified.  Cardiomegaly.  No pleural or pericardial effusion.  Prominent anterior mediastinal soft tissue.  Limited images through the upper abdomen demonstrate no acute abnormality.  Degraded by respiratory motion.  Central airways are patent.  Mild patchy opacity within the lower lobes, left greater than right, and mild interlobular septal prominence particularly in the left lower lobe. No pneumothorax.  No acute osseous finding.  IMPRESSION:  No main branch pulmonary embolism.  Lobar and more peripheral branches are poorly opacified, limiting evaluation.  Cardiomegaly. Patchy bibasilar opacities and interlobular septal prominence; favored to reflect edema or atypical infection.  Prominent anterior mediastinal soft tissue may reflect residual thymus.  Consider 24-month follow-up.  Original Report Authenticated By:  Waneta Martins, M.D.   Dg Chest Portable 1 View  12/12/2011  *RADIOLOGY REPORT*  Clinical Data: Cough, shortness of breath  PORTABLE CHEST - 1 VIEW  Comparison: 01/27/2011  Findings: Cardiomegaly.  Central vascular congestion.  Interstitial prominence.  Mild lung base opacities.  No pneumothorax.  Cannot exclude small pleural effusions.  No acute osseous finding.  IMPRESSION: Cardiomegaly with central vascular congestion and interstitial edema pattern.  Bibasilar opacities; atelectasis versus infiltrate.  Original Report Authenticated By: Waneta Martins, M.D.    Review of Systems  Constitutional: Negative.   HENT: Negative.   Eyes: Negative.   Respiratory: Positive for cough, sputum production and shortness of breath.   Cardiovascular: Negative.   Gastrointestinal: Negative.   Genitourinary: Negative.   Musculoskeletal: Negative.   Skin: Negative.   Neurological: Negative.   Endo/Heme/Allergies: Negative.   Psychiatric/Behavioral: Negative.     Blood pressure 134/89, pulse 101, temperature 98.8 F (37.1 C), temperature source Oral, resp. rate 18, height 5' 3.39" (1.61 m), weight 148.3 kg (326 lb 15.1 oz), last menstrual period 01/04/2011, SpO2 97.00%. Physical Exam  Constitutional: She is oriented to person, place, and time. She appears well-developed and well-nourished. No distress.  HENT:  Head: Normocephalic and atraumatic.  Right Ear: External ear normal.  Left Ear: External ear normal.  Nose: Nose normal.  Mouth/Throat: Oropharynx is clear and moist. No oropharyngeal exudate.  Eyes: Conjunctivae are normal. Pupils are equal, round, and reactive to light. Right eye exhibits no discharge. Left eye exhibits no discharge. No scleral icterus.  Neck: Normal range of motion. Neck supple.  Cardiovascular: Normal rate and regular rhythm.   Respiratory: Effort normal and breath sounds normal. No respiratory distress. She has no wheezes. She has no rales.  GI: Soft. Bowel sounds  are normal. She exhibits no distension. There is no tenderness. There is no rebound.  Musculoskeletal: Normal range of motion. She exhibits edema. She exhibits no tenderness.  Neurological: She is alert and oriented to person, place, and time.       Moves all extremities.  Skin: Skin is warm and dry. She is not diaphoretic. No erythema.  Psychiatric: Her behavior is normal.     Assessment/Plan #1. Shortness of breath cause not clear - differentials include postpartum cardiomyopathy, possible atypical infection and pulmonary embolism. CT angiogram was negative for PE but was said it was not a good study for peripheral embolism so patient was started IV heparin which we will continue until we get a Doppler of the lower extremity to rule out DVT. Given her cardiomegaly and her lower extremity edema and CT showing possibility of edema cardiomyopathy is under the differentials though her BNP is normal. We will check a 2-D echo. As there is also concern for possibility of atypical infection patient has been started ceftriaxone Zithromax which we will continue.  #2. Normocytic hypochromic anemia - patient does have chronic anemia and hemoglobin is stable. Closely follow CBC. #3. History of pseudotumor cerebri - denies any visual symptoms or headache at this time. #4. Morbid obesity. #5. History of childhood asthma.  CODE STATUS - full code.  Eduard Clos. 12/12/2011, 6:11 AM

## 2011-12-12 NOTE — Progress Notes (Addendum)
Patient seen and chart reviewed, case discussed with patient's OB physician Dr. Birdie Hopes, trial of IV Lasix, shortness of breath likely secondary to fluid overload was minus atypical infection.  Await echo and lower extremity duplex, clinical likelihood of PE is low, however if need be he Dr. Senaida Ores has cleared patient using Lasix, Rocephin, azithromycin, Lovenox/heparin in the light of her recent surgery and breast feeding.    Addendum patient's H&H is noted to be low since her discharge last admission, lower extremity duplex is negative for any clots, Lasix which I gave her in the ER patient is diuresed about liter and a half, she has come off of oxygen, she has no chest pain or shortness of breath now,  At this point realistic chance of having a clinically significant PE is negligible to none, stop anticoagulation, check Anemia panel, H&H in am.

## 2011-12-12 NOTE — ED Notes (Signed)
WUJ:[id number]<JY> Expected date:<BR> Expected time:<BR> Means of arrival:<BR> Comments:<BR> Shortness of breath, cough, s/p C-section x 5 days

## 2011-12-12 NOTE — ED Notes (Signed)
Pt transported to unit.

## 2011-12-12 NOTE — ED Notes (Signed)
Report called to RN, Iffe.

## 2011-12-12 NOTE — Progress Notes (Signed)
ANTICOAGULATION CONSULT NOTE - Initial Consult  Pharmacy Consult for IV heparin Indication: r/o PE  No Known Allergies  Patient Measurements: Height: 5\' 3"  (160 cm) Weight: 331 lb (150.141 kg) IBW/kg (Calculated) : 52.4    Vital Signs: Temp: 98.1 F (36.7 C) (06/09 0926) Temp src: Oral (06/09 0926) BP: 108/72 mmHg (06/09 0926) Pulse Rate: 97  (06/09 0926)  Labs:  Alvira Philips 12/12/11 1191 12/12/11 0846 12/12/11 0816 12/12/11 0042 12/12/11 0030  HGB -- -- 7.3* 7.8* --  HCT -- -- 23.4* 25.0* --  PLT -- -- 382 441* --  APTT -- -- -- -- 32  LABPROT -- -- -- -- 13.3  INR -- -- -- -- 0.99  HEPARINUNFRC 0.32 -- -- -- --  CREATININE -- -- 0.56 0.61 --  CKTOTAL -- 106 -- -- --  CKMB -- 2.3 -- -- --  TROPONINI -- <0.30 -- <0.30 --    Estimated Creatinine Clearance: 155.3 ml/min (by C-G formula based on Cr of 0.56).   Medical History: Past Medical History  Diagnosis Date  . Asthma     Medications:  Scheduled:    . albuterol  5 mg Nebulization Once  . azithromycin (ZITHROMAX) 500 MG IVPB  500 mg Intravenous Once  . azithromycin  500 mg Intravenous Q24H  . cefTRIAXone (ROCEPHIN)  IV  1 g Intravenous Once  . cefTRIAXone (ROCEPHIN)  IV  1 g Intravenous Q24H  . furosemide  20 mg Intravenous Once  . heparin  2,500 Units Intravenous Once  . sodium chloride  3 mL Intravenous Q12H  . sodium chloride  3 mL Intravenous Q12H   Infusions:    . heparin 1,900 Units/hr (12/12/11 0310)   PRN: acetaminophen, acetaminophen, iohexol, ondansetron (ZOFRAN) IV, ondansetron, oxyCODONE-acetaminophen  Assessment:  26 yo F s/p cesarean section 1 week ago, now with SOB and on IV heparin for r/o PE.  Dr. Senaida Ores cleared her for use of IV heparin in light of recent surgery.   IV heparin level therapeutic x 1, will repeat level to assure therapeutic.   CT/Angio: No main bracn PE but limited study to determine peripheral branch PE.    Prelim Dopplers negative for DVT/PE  Per Dr. Ella Jubilee  will continue IV heparin and repeat imaging in morning to further r/o PE.   Hgb 7.3 noted, made MD aware, will continue IV heparin for now.    Goal of Therapy:  INR 2-3 Monitor platelets by anticoagulation protocol: Yes   Plan:  1.) Continue IV heparin at current rate 2.) Repeat heparin level at 1530 to assure it is therapeutic after initation.  3.) Monitor hgb closely post-operatively.   Danira Nylander, [place] Messing 12/12/2011,12:14 PM

## 2011-12-12 NOTE — ED Notes (Signed)
Attempted to call report to RN who was again unable to accept.

## 2011-12-13 DIAGNOSIS — R112 Nausea with vomiting, unspecified: Secondary | ICD-10-CM

## 2011-12-13 DIAGNOSIS — R0602 Shortness of breath: Secondary | ICD-10-CM

## 2011-12-13 LAB — BASIC METABOLIC PANEL
BUN: 9 mg/dL (ref 6–23)
CO2: 20 mEq/L (ref 19–32)
Calcium: 9.3 mg/dL (ref 8.4–10.5)
Glucose, Bld: 95 mg/dL (ref 70–99)
Sodium: 137 mEq/L (ref 135–145)

## 2011-12-13 LAB — VITAMIN B12: Vitamin B-12: 261 pg/mL (ref 211–911)

## 2011-12-13 LAB — FOLATE: Folate: 11.4 ng/mL

## 2011-12-13 MED ORDER — FUROSEMIDE 10 MG/ML IJ SOLN
20.0000 mg | Freq: Once | INTRAMUSCULAR | Status: AC
  Administered 2011-12-13: 20 mg via INTRAVENOUS
  Filled 2011-12-13: qty 2

## 2011-12-13 MED ORDER — POTASSIUM CHLORIDE CRYS ER 20 MEQ PO TBCR
40.0000 meq | EXTENDED_RELEASE_TABLET | Freq: Once | ORAL | Status: AC
  Administered 2011-12-13: 40 meq via ORAL
  Filled 2011-12-13: qty 2

## 2011-12-13 NOTE — Discharge Instructions (Signed)
Follow with Primary MD MEISINGER,TODD D, MD, MD in 3 days , check with your OB physician if your okay to breast-feed before you actually start breast feeding, TURP or by your [facility] physician pump and discard breast milk.  Get CBC, CMP, checked 3 days by Primary MD and again as instructed by your Primary MD. Get a 2 view Chest X ray done next visit if you had Pneumonia of Lung problems at the Hospital.  Get Medicines reviewed and adjusted.  Please request your Prim.MD to go over all Hospital Tests and Procedure/Radiological results at the follow up, please get all Hospital records sent to your Prim MD by signing hospital release before you go home.  Activity: As tolerated with Full fall precautions use walker/cane & assistance as needed  Diet: Heart healthy     Check your Weight same time everyday, if you gain over 2 pounds, or you develop in leg swelling, experience more shortness of breath or chest pain, call your Primary MD immediately. Follow Cardiac Low Salt Diet and 1.8 lit/day fluid restriction.  Disposition Home    If you experience worsening of your admission symptoms, develop shortness of breath, life threatening emergency, suicidal or homicidal thoughts you must seek medical attention immediately by calling 911 or calling your MD immediately  if symptoms less severe.  You Must read complete instructions/literature along with all the possible adverse reactions/side effects for all the Medicines you take and that have been prescribed to you. Take any new Medicines after you have completely understood and accpet all the possible adverse reactions/side effects.   Do not drive if your were admitted for syncope or siezures until you have seen by Primary MD or a Neurologist and advised to drive.  Do not drive when taking Pain medications.    Do not take more than prescribed Pain, Sleep and Anxiety Medications  Special Instructions: If you have smoked or chewed Tobacco  in the last 2 yrs  please stop smoking, stop any regular Alcohol  and or any Recreational drug use.  Wear Seat belts while driving.

## 2011-12-13 NOTE — Care Management Note (Signed)
    Page 1 of 1   12/13/2011     11:04:10 AM   CARE MANAGEMENT NOTE 12/13/2011  Patient:  Jill Odom, Jill Odom   Account Number:  [id number]  Date Initiated:  12/13/2011  Documentation initiated by:  Lanier Clam  Subjective/Objective Assessment:   ADMITTED W/SOB.READMIT 6/3-12/10/11.     Action/Plan:   FROM HOME   Anticipated DC Date:  12/13/2011   Anticipated DC Plan:  HOME/SELF CARE      DC Planning Services  CM consult      Choice offered to / List presented to:             Status of service:  Completed, signed off Medicare Important Message given?   (If response is "NO", the following Medicare IM given date fields will be blank) Date Medicare IM given:   Date Additional Medicare IM given:    Discharge Disposition:  HOME/SELF CARE  Per UR Regulation:  Reviewed for med. necessity/level of care/duration of stay  If discussed at Long Length of Stay Meetings, dates discussed:    Comments:  12/13/11 [facility] Amanii Snethen RN,BSN [phone]

## 2011-12-13 NOTE — Discharge Summary (Signed)
[facility]                                                                                   Jill Odom, is a 26 y.o. female  DOB 1985-08-15  MRN [id number].  Admission date:  12/12/2011  Discharge Date:  12/13/2011  Primary MD  MEISINGER,TODD D, MD, MD  Admitting Physician  Leroy Sea, MD  Admission Diagnosis  Dyspnea [786.09] cough, shortness of breath  Discharge Diagnosis     Principal Problem:  *Shortness of breath Active Problems:  Anemia       Past Medical History  Diagnosis Date  . Asthma     Past Surgical History  Procedure Date  . Cesarean section 12/06/2011    Procedure: CESAREAN SECTION;  Surgeon: Oliver Pila, MD;  Location: [facility];  Service: Gynecology;  Laterality: N/A;     Hospital Course See H&P, Labs, Consult and Test reports for all details in brief- young obese African American female who had a C-section about a week ago, then discharged home 2 days ago, patient arrived home that evening she guarded getting short of breath, presented to the ER, where she had evidence of edema on exam, CT scan of the chest with contrast was done which did not show any evidence of PE in the main vessels however the study was slightly compromised due to patient's motion. Jill Odom did have lower extremity ultrasound which was unremarkable i.e. no clots were seen. Her echogram was essentially stable except for increased filling pressure and possibly slight reduction and mitral valve area. Patient did not have fever or leukocytosis, she was empirically placed on heparin drip for a few hours however Lasix which was given in the ER by me IV caused much diuresis in the next few hours of admission with immediate relief. She came off of oxygen within few hours of admission, heparin drip was stopped. Patient was ambulated and she remained symptom-free.   He should now is complete he symptom-free and off of oxygen with stable vital signs, her case was discussed  by me with her OB physician Dr. Huel Cote who recommended that patient was okay for taking all the medications that he was she was given in the hospital i.e. heparin, azithromycin and Rocephin for presumed pneumonia which I do not think was the case, Lasix.   She will be discharged home in stable condition, her echo gram suggests increased filling pressure in the left ventricle, slightly reduced mitral valve area. Patient currently symptom free we'll set her up with cardiology in one week as outpatient.   Jill Odom has evidence of iron deficiency anemia since her discharge time from her OB admission, have requested patient to discuss this with her OB physician and primary care physician to see if she would benefit from oral iron supplementation.     Consults  Phone-Kathy Richardson-OB  Significant Tests:  See full reports for all details    Lower Ext Duplex- stable, no clots.   Ct Angio Chest W/cm &/or Wo Cm  12/12/2011  *RADIOLOGY REPORT*  Clinical Data: Cough, shortness of breath  CT ANGIOGRAPHY CHEST  Technique:  Multidetector CT imaging of the  chest using the standard protocol during bolus administration of intravenous contrast. Multiplanar reconstructed images including MIPs were obtained and reviewed to evaluate the vascular anatomy.  Contrast: OMNIPAQUE IOHEXOL 300 MG/ML  SOLN  Comparison: 12/12/2011 radiograph  Findings: Suboptimal contrast bolus timing.  No main branch pulmonary arterial filling defect.  Lobar and more peripheral branches are not adequately opacified.  Cardiomegaly.  No pleural or pericardial effusion.  Prominent anterior mediastinal soft tissue.  Limited images through the upper abdomen demonstrate no acute abnormality.  Degraded by respiratory motion.  Central airways are patent.  Mild patchy opacity within the lower lobes, left greater than right, and mild interlobular septal prominence particularly in the left lower lobe. No pneumothorax.  No acute osseous  finding.  IMPRESSION:  No main branch pulmonary embolism.  Lobar and more peripheral branches are poorly opacified, limiting evaluation.  Cardiomegaly. Patchy bibasilar opacities and interlobular septal prominence; favored to reflect edema or atypical infection.  Prominent anterior mediastinal soft tissue may reflect residual thymus.  Consider 2-month follow-up.  Original Report Authenticated By: Waneta Martins, M.D.   US Ob Follow Up  12/06/2011  OBSTETRICAL ULTRASOUND: This exam was performed within a [facility]. The OB US report was generated in the [facility] system, and faxed to the ordering physician.   This report is also available in [employer] and in the [employer]. See [facility] Obstetric US report.   Dg Chest Portable 1 View  12/12/2011  *RADIOLOGY REPORT*  Clinical Data: Cough, shortness of breath  PORTABLE CHEST - 1 VIEW  Comparison: 01/27/2011  Findings: Cardiomegaly.  Central vascular congestion.  Interstitial prominence.  Mild lung base opacities.  No pneumothorax.  Cannot exclude small pleural effusions.  No acute osseous finding.  IMPRESSION: Cardiomegaly with central vascular congestion and interstitial edema pattern.  Bibasilar opacities; atelectasis versus infiltrate.  Original Report Authenticated By: Waneta Martins, M.D.     Today   Subjective:   Jill Odom today has no headache,no chest abdominal pain,no new weakness tingling or numbness, feels much better wants to go home today.    Objective:   Blood pressure 123/84, pulse 83, temperature 98.1 F (36.7 C), temperature source Oral, resp. rate 83, height 5\' 3"  (1.6 m), weight 149.3 kg (329 lb 2.4 oz), last menstrual period 01/04/2011, SpO2 96.00%, currently breastfeeding.  Intake/Output Summary (Last 24 hours) at 12/13/11 1034 Last data filed at 12/13/11 0500  Gross per 24 hour  Intake    840 ml  Output   2600 ml  Net  -1760 ml    Exam Awake Alert, Oriented *3, No new F.N  deficits, Normal affect [place].AT,PERRAL Supple Neck,No JVD, No cervical lymphadenopathy appriciated.  Symmetrical Chest wall movement, Good air movement bilaterally, CTAB RRR,No Gallops,Rubs or new Murmurs, No Parasternal Heave +ve B.Sounds, Abd Soft, Non tender, No organomegaly appriciated, No rebound -guarding or rigidity. No Cyanosis, Clubbing or edema, No new Rash or bruise  Data Review      Recent Results (from the past 240 hour(s))  SURGICAL PCR SCREEN     Status: Normal   Collection Time   12/06/11  2:39 PM      Component Value Range Status Comment   MRSA, PCR NEGATIVE  NEGATIVE  Final    Staphylococcus aureus NEGATIVE  NEGATIVE  Final      CBC w Diff: Lab Results  Component Value Date   WBC 8.5 12/12/2011   HGB 7.4* 12/13/2011   HCT 24.8* 12/13/2011   PLT  382 12/12/2011    CMP: Lab Results  Component Value Date   NA 137 12/13/2011   K 3.5 12/13/2011   CL 107 12/13/2011   CO2 20 12/13/2011   BUN 9 12/13/2011   CREATININE 0.59 12/13/2011   PROT 6.6 12/12/2011   ALBUMIN 2.3* 12/12/2011   BILITOT 0.2* 12/12/2011   ALKPHOS 115 12/12/2011   AST 21 12/12/2011   ALT 13 12/12/2011  .   Discharge Instructions     Follow with Primary MD MEISINGER,TODD D, MD, MD in 3 days , check with your OB physician if your okay to breast-feed before you actually start breast feeding, TURP or by your [facility] physician pump and discard breast milk.  Get CBC, CMP, checked 3 days by Primary MD and again as instructed by your Primary MD. Get a 2 view Chest X ray done next visit if you had Pneumonia of Lung problems at the Hospital.  Get Medicines reviewed and adjusted.  Please request your Prim.MD to go over all Hospital Tests and Procedure/Radiological results at the follow up, please get all Hospital records sent to your Prim MD by signing hospital release before you go home.  Activity: As tolerated with Full fall precautions use walker/cane & assistance as needed  Diet: Heart healthy     Check your Weight  same time everyday, if you gain over 2 pounds, or you develop in leg swelling, experience more shortness of breath or chest pain, call your Primary MD immediately. Follow Cardiac Low Salt Diet and 1.8 lit/day fluid restriction.  Disposition Home    If you experience worsening of your admission symptoms, develop shortness of breath, life threatening emergency, suicidal or homicidal thoughts you must seek medical attention immediately by calling 911 or calling your MD immediately  if symptoms less severe.  You Must read complete instructions/literature along with all the possible adverse reactions/side effects for all the Medicines you take and that have been prescribed to you. Take any new Medicines after you have completely understood and accpet all the possible adverse reactions/side effects.   Do not drive if your were admitted for syncope or siezures until you have seen by Primary MD or a Neurologist and advised to drive.  Do not drive when taking Pain medications.    Do not take more than prescribed Pain, Sleep and Anxiety Medications  Special Instructions: If you have smoked or chewed Tobacco  in the last 2 yrs please stop smoking, stop any regular Alcohol  and or any Recreational drug use.  Wear Seat belts while driving.  Follow-up Information    Follow up with MEISINGER,TODD D, MD. Schedule an appointment as soon as possible for a visit in 3 days.   Contact information:   [address], [address] [place] [address] [phone]       Follow up with Oliver Pila, MD. Schedule an appointment as soon as possible for a visit in 3 days.   Contact information:   [address], [address] [place] [address] [phone]       Follow up with Pamella Pert, MD in 1 week.   Contact information:   [address]  [place] [address] [phone]          Discharge Medications    Jill Odom, Jill Odom  Home  Medication Instructions [id number]   Printed on:12/13/11 1025  Medication Information  ibuprofen (ADVIL,MOTRIN) 600 MG tablet Take 1 tablet (600 mg total) by mouth every 6 (six) hours.           oxyCODONE-acetaminophen (PERCOCET) 5-325 MG per tablet Take 1-2 tablets by mouth every 3 (three) hours as needed (moderate - severe pain).              Total Time in preparing paper work, data evaluation and todays exam - 35 minutes  Leroy Sea M.D on 12/13/2011 at 10:34 AM  [facility] Office  [phone]

## 2011-12-14 LAB — URINE CULTURE
Colony Count: >=100000
Culture  Setup Time: 201306091321

## 2011-12-17 ENCOUNTER — Emergency Department (HOSPITAL_COMMUNITY)
Admission: EM | Admit: 2011-12-17 | Discharge: 2011-12-17 | Disposition: A | Discharge status: Home / Self Care | Payer: Medicaid Other | Attending: Emergency Medicine | Admitting: Emergency Medicine

## 2011-12-17 ENCOUNTER — Encounter (HOSPITAL_COMMUNITY): Payer: Self-pay | Admitting: *Deleted

## 2011-12-17 ENCOUNTER — Emergency Department (HOSPITAL_COMMUNITY): Payer: Medicaid Other

## 2011-12-17 DIAGNOSIS — R0602 Shortness of breath: Secondary | ICD-10-CM | POA: Insufficient documentation

## 2011-12-17 DIAGNOSIS — R05 Cough: Secondary | ICD-10-CM | POA: Insufficient documentation

## 2011-12-17 DIAGNOSIS — R059 Cough, unspecified: Secondary | ICD-10-CM | POA: Insufficient documentation

## 2011-12-17 LAB — DIFFERENTIAL
Basophils Absolute: 0.1 10*3/uL (ref 0.0–0.1)
Basophils Relative: 1 % (ref 0–1)
Eosinophils Absolute: 0.1 10*3/uL (ref 0.0–0.7)
Eosinophils Relative: 1 % (ref 0–5)
Lymphocytes Relative: 26 % (ref 12–46)
Lymphs Abs: 2.2 10*3/uL (ref 0.7–4.0)
Monocytes Absolute: 0.4 10*3/uL (ref 0.1–1.0)
Monocytes Relative: 5 % (ref 3–12)
Neutro Abs: 5.5 10*3/uL (ref 1.7–7.7)
Neutrophils Relative %: 67 % (ref 43–77)

## 2011-12-17 LAB — BASIC METABOLIC PANEL WITH GFR
BUN: 8 mg/dL (ref 6–23)
CO2: 21 meq/L (ref 19–32)
Calcium: 9.7 mg/dL (ref 8.4–10.5)
Chloride: 107 meq/L (ref 96–112)
Creatinine, Ser: 0.65 mg/dL (ref 0.50–1.10)
GFR calc Af Amer: >90 mL/min
GFR calc non Af Amer: >90 mL/min
Glucose, Bld: 96 mg/dL (ref 70–99)
Potassium: 3.6 meq/L (ref 3.5–5.1)
Sodium: 139 meq/L (ref 135–145)

## 2011-12-17 LAB — BASIC METABOLIC PANEL
BUN: 9 mg/dL (ref 6–23)
CO2: 20 mEq/L (ref 19–32)
Chloride: 109 mEq/L (ref 96–112)
Creatinine, Ser: 0.64 mg/dL (ref 0.50–1.10)

## 2011-12-17 LAB — CBC
HCT: 26.8 % — ABNORMAL LOW (ref 36.0–46.0)
MCH: 25.3 pg — ABNORMAL LOW (ref 26.0–34.0)
MCV: 82.7 fL (ref 78.0–100.0)
RBC: 3.24 MIL/uL — ABNORMAL LOW (ref 3.87–5.11)
RDW: 16.4 % — ABNORMAL HIGH (ref 11.5–15.5)
WBC: 8.3 10*3/uL (ref 4.0–10.5)

## 2011-12-17 MED ORDER — FUROSEMIDE 10 MG/ML IJ SOLN
40.0000 mg | Freq: Once | INTRAMUSCULAR | Status: AC
  Administered 2011-12-17: 40 mg via INTRAVENOUS
  Filled 2011-12-17: qty 4

## 2011-12-17 MED ORDER — BENZONATATE 100 MG PO CAPS
100.0000 mg | ORAL_CAPSULE | Freq: Once | ORAL | Status: AC
  Administered 2011-12-17: 100 mg via ORAL
  Filled 2011-12-17: qty 1

## 2011-12-17 MED ORDER — FUROSEMIDE 20 MG PO TABS: 20.0000 mg | ORAL_TABLET | Freq: Every day | ORAL | Status: DC

## 2011-12-17 MED ORDER — BENZONATATE 100 MG PO CAPS: 100.0000 mg | ORAL_CAPSULE | Freq: Three times a day (TID) | ORAL | Status: AC

## 2011-12-17 NOTE — ED Provider Notes (Signed)
Medical screening examination/treatment/procedure(s) were conducted as a shared visit with non-physician practitioner(s) and myself.  I personally evaluated the patient during the encounter   Recent birth  pw shortness of breath. Recent admit for same w/u negative for PE, no protein in urine, Echocardiogram with nl EF, not c/w PPCM. Pt felt better with lasix and was discharged home.  CTAB. 3+ pitting edema b/l LE. CXR with min edema c/w CTA previously. Labs unremarkable. 2L out s/p lasix in ED. Ambulatory without dyspnea and repeat electrolytes wnl. Comfortable with discharge home. Low dose lasix until PMD/OB/pulm f/u.   Forbes Cellar, MD 12/17/11 2231

## 2011-12-17 NOTE — ED Notes (Signed)
Pt requesting foley catheter due to having to get up and down and becoming more dyspneic on any exertion.

## 2011-12-17 NOTE — Discharge Instructions (Signed)
Dyspnea  Shortness of breath (dyspnea) is the feeling of uneasy breathing. Dyspnea should be evaluated promptly. DIAGNOSIS  Many tests may be done to find why you are having shortness of breath. Tests may include:  A chest X-ray.   A lung function test.   Blood tests.   Recordings of the electrical activity of the heart (electrocardiogram).   Exercise testing.   Sound wave images of the heart (a cardiac echocardiogram).   A scan.  A cause for your shortness of breath may not be identified initially. In this case, it is important to have a follow-up exam with your caregiver. HOME CARE INSTRUCTIONS   Do not smoke. Smoking is a common cause of shortness of breath. Ask for help to stop smoking.   Avoid being around chemicals that may bother your breathing, such as paint fumes or dust.   Rest as needed. Slowly begin your usual activities.   If medications were prescribed, take them as directed for the full length of time directed. This includes oxygen and any inhaled medications, if prescribed.   It is very important that you follow up with your caregiver or other physician as directed. Waiting to do so or failure to follow up could result in worsening of your condition, possible disability, or death.   Be sure you understand what to do or who to call if your shortness of breath worsens.  SEEK MEDICAL CARE IF:   Your condition does not improve in the time expected.   You have a hard time doing your normal activities even with rest.   You have any side effects from or problems with medications prescribed.  SEEK IMMEDIATE MEDICAL CARE IF:   You feel your shortness of breath is getting worse.   You feel lightheaded, faint or develop a cough not controlled with medications.   You start coughing up blood.   You get pain with breathing.   You get chest pain or pain in your arms, shoulders or belly (abdomen).   You have a fever.   You are unable to walk up stairs or exercise  the way you normally can.  MAKE SURE YOU:   Understand these instructions.   Will watch your condition.   Will get help right away if you are not doing well or get worse.  Document Released: 07/29/2004 Document Revised: 03/03/2011 Document Reviewed: 11/06/2009 [facility] Patient Information 2012 [place], [place].  RESOURCE GUIDE  Dental Problems  Patients with Medicaid: [facility] [phone]                                           [phone] [address] [employer] Phone:  [phone]                                                   Phone:  [phone]  If unable to pay or uninsured, contact:  [facility] or [facility]. to become qualified for the adult dental clinic.  Chronic Pain Problems Contact [facility]  [phone] Patients need to be referred by their primary care doctor.  Insufficient Money for Medicine Contact [employer]:  call "211" or [facility] [phone].  No Primary Care Doctor Call [facility]  [phone] Other agencies that provide inexpensive medical care    [facility]  [phone]    [facility]  [phone]    [facility]  [phone]    [facility]  [phone]    [facility]  [phone]    [facility]  [phone]  Psychological Services [facility]  [phone] [place] [facility]  [phone] [facility]   [phone] (emergency services [phone])  Abuse/Neglect [facility] Child Abuse Hotline [phone] [facility] Hotline 725-170-4851 (After Hours)  Emergency Shelter [facility] [phone]  Maternity Homes [facility] at the [place] [facility] [phone] [facility] [phone]  MRSA Hotline #:   5158016191    [facility] Resources  [facility]  [employer]                           [facility] Dept. [address] [place]                     [address]         [address]  Blondell Reveal Phone:  [phone]                                  Phone:  [phone]                   Phone:  [phone]  [facility] Phone:  [phone]  [facility] Child Abuse Hotline [phone] [phone] (After Hours)

## 2011-12-17 NOTE — ED Notes (Signed)
Pt. Ambulated down the hall and back. Tolerated very well, asking whether she will be going home or not

## 2011-12-17 NOTE — ED Provider Notes (Signed)
History     CSN: [id number]  Arrival date & time 12/17/11  1429   First MD Initiated Contact with Patient 12/17/11 1558      Chief Complaint  Patient presents with  . Cough  . Shortness of Breath    (Consider location/radiation/quality/duration/timing/severity/associated sxs/prior treatment) HPI Hx from pt. 26yo F who is s/p c-section on 6/3 presents with cough, shortness of breath which has been gradually increasing over the past several days. Has had some slight orthopnea. Denies any leg swelling. Does have hx asthma, and states she used her albuterol inhaler without improvement. Sx moderate at this time.  No known aggravating/alleviating factors.  Pt was seen for the same on 6/9 with acute onset SOB. Given her recent postpartum state there was considerable concern for PE vs postpartum cardiomyopathy; CTA chest did not show any large PEs but there was motion artifact on the exam. Pt was admitted for further evaluation - she had a negative echo, dopplers, and bloodwork during exam. She was given a dose of Lasix and had considerable diuresis and symptomatic improvement with this. She was discharged and instructed to f/u with cardiology as an outpatient. She states these symptoms feel similar to previous presentation.  Past Medical History  Diagnosis Date  . Asthma     Past Surgical History  Procedure Date  . Cesarean section 12/06/2011    Procedure: CESAREAN SECTION;  Surgeon: Oliver Pila, MD;  Location: [facility];  Service: Gynecology;  Laterality: N/A;    History reviewed. No pertinent family history.  History  Substance Use Topics  . Smoking status: Never Smoker   . Smokeless tobacco: Not on file  . Alcohol Use: No    OB History    Grav Para Term Preterm Abortions TAB SAB Ect Mult Living   1 1 1  0 0 0 0 0 0 1      Review of Systems  Constitutional: Negative for fever, chills, activity change and appetite change.  HENT: Negative for congestion, sore throat,  rhinorrhea and trouble swallowing.   Respiratory: Positive for cough and shortness of breath. Negative for chest tightness and stridor.   Cardiovascular: Negative for chest pain, palpitations and leg swelling.  Gastrointestinal: Negative for nausea, vomiting and abdominal pain.  Musculoskeletal: Negative for myalgias.  Skin: Negative for color change and rash.  Neurological: Negative for weakness.  All other systems reviewed and are negative.    Allergies  Review of patient's allergies indicates no known allergies.  Home Medications   Current Outpatient Rx  Name Route Sig Dispense Refill  . ALBUTEROL SULFATE HFA 108 (90 BASE) MCG/ACT IN AERS Inhalation Inhale 2 puffs into the lungs every 6 (six) hours as needed. For shortness of breath.    . IBUPROFEN 600 MG PO TABS Oral Take 1 tablet (600 mg total) by mouth every 6 (six) hours. 30 tablet 1  . OXYCODONE-ACETAMINOPHEN 5-325 MG PO TABS Oral Take 1-2 tablets by mouth every 3 (three) hours as needed (moderate - severe pain). 30 tablet 0    BP 141/82  Pulse 70  Temp 98.8 F (37.1 C) (Oral)  Resp 24  SpO2 99%  LMP 12/06/2011  Breastfeeding? No  Physical Exam  Nursing note and vitals reviewed. Constitutional: She appears well-developed and well-nourished. No distress.  HENT:  Head: Normocephalic and atraumatic.  Mouth/Throat: Oropharynx is clear and moist. No oropharyngeal exudate.  Eyes: EOM are normal. Pupils are equal, round, and reactive to light.  Neck: Normal range of motion.  Cardiovascular:  Normal rate, regular rhythm and normal heart sounds.   Pulmonary/Chest: Effort normal and breath sounds normal. She has no wheezes. She exhibits no tenderness.  Abdominal: Soft. Bowel sounds are normal.  Musculoskeletal: Normal range of motion.       Mild LE edema without pitting, equal bilaterally, nontender to palpation, not tender on dorsiflexion of ankles  Neurological: She is alert.  Skin: Skin is warm and dry. She is not  diaphoretic.  Psychiatric: She has a normal mood and affect.    ED Course  Procedures (including critical care time)   Labs Reviewed  CBC  DIFFERENTIAL  BASIC METABOLIC PANEL   Dg Chest 2 View  12/17/2011  *RADIOLOGY REPORT*  Clinical Data: Cough, shortness of breath  CHEST - 2 VIEW  Comparison: 12/12/2011  Findings: Cardiomegaly again noted.  Central mild vascular congestion and mild perihilar interstitial prominence.  Mild interstitial edema cannot be excluded.  No focal infiltrate.  Mild basilar atelectasis.  Bony thorax is stable.  IMPRESSION: Central mild vascular congestion and mild perihilar interstitial prominence.  Mild interstitial edema cannot be excluded.  No focal infiltrate.  Mild basilar atelectasis.  Original Report Authenticated By: Natasha Mead, M.D.     No diagnosis found.    MDM  Pt with recent C-section presents with cough and shortness of breath. Seen for same and hospitalized for PE/cardiomyopathy workup earlier in the week. Pt actually felt remarkably better s/p 1 Lasix dose during her hospitalization.   Lasix 40 mg was given during this visit. Pt requested a Foley as she was uncomfortable and having significant UOP - as of 8:30 PM she has had ~2L of UOP via Foley bag not counting UOP before insertion of Foley. She feels better at this time. Will plan to recheck lytes given significant amount of diuresis. Vitals remain stable. Signed out to Dr. Hyman Hopes at 915pm pending repeat lytes. Anticipate pt can be discharged home with close cardiology f/u.        Grant Fontana, PA-C 12/18/11 1307

## 2011-12-17 NOTE — ED Notes (Signed)
Had C-section 2 weeks ago, has spent three days in hospital with shortness of breath/chf last week.

## 2011-12-17 NOTE — ED Notes (Signed)
Pt c/o cough since last Saturday with SOB, states was seen for same and told "my heart rate was 300 and I was close to having a heart attack."

## 2011-12-20 ENCOUNTER — Telehealth: Payer: Self-pay | Admitting: Internal Medicine

## 2011-12-20 NOTE — Telephone Encounter (Signed)
Spoke with Jill Odom, pt can be worked in for consult w/ MW on Thurs or Fri mornings.  Called spoke with patient who verified that she has been experiencing some increased SOB and cough, with increased use of her albuterol mdi.  Offered work-in appt with MW.  Pt to be seen 6.21.13 @ 0900 > arrive 15 mins early for paperwork, bring her insurance cards and all medication bottles.  Pt okay with this date and time.  Pt to call back or seek emergency assistance if her breathing worsens prior to her appt.

## 2011-12-24 ENCOUNTER — Ambulatory Visit (INDEPENDENT_AMBULATORY_CARE_PROVIDER_SITE_OTHER): Payer: Medicaid Other | Admitting: Internal Medicine

## 2011-12-24 ENCOUNTER — Encounter: Payer: Self-pay | Admitting: Internal Medicine

## 2011-12-24 VITALS — BP 102/68 | HR 74 | Temp 98.2°F | Ht 63.5 in | Wt 296.0 lb

## 2011-12-24 DIAGNOSIS — J45909 Unspecified asthma, uncomplicated: Secondary | ICD-10-CM

## 2011-12-24 DIAGNOSIS — D649 Anemia, unspecified: Secondary | ICD-10-CM

## 2011-12-24 MED ORDER — AMOXICILLIN-POT CLAVULANATE 875-125 MG PO TABS: 1.0000 | ORAL_TABLET | Freq: Two times a day (BID) | ORAL | Status: AC

## 2011-12-24 MED ORDER — POLYSACCHARIDE IRON COMPLEX 150 MG PO CAPS: 150.0000 mg | ORAL_CAPSULE | Freq: Two times a day (BID) | ORAL | Status: DC

## 2011-12-24 MED ORDER — PREDNISONE (PAK) 10 MG PO TABS: ORAL_TABLET | ORAL | Status: AC

## 2011-12-24 NOTE — Patient Instructions (Addendum)
Work on inhaler technique:  relax and gently blow all the way out then take a nice smooth deep breath back in, triggering the inhaler at same time you start breathing in.  Hold for up to 5 seconds if you can.  Rinse and gargle with water when done   If your mouth or throat starts to bother you,   I suggest you time the inhaler to your dental care and after using the inhaler(s) brush teeth and tongue with a baking soda containing toothpaste and when you rinse this out, gargle with it first to see if this helps your mouth and throat.     The goal for your use of your inhaler is less than twice a week  Prednisone 10 mg take  4 each am x 2 days,   2 each am x 2 days,  1 each am x2days and stop   Augmentin 875 twice daily x 10 days  Try prilosec 20mg   Take 30-60 min before first meal of the day and Pepcid 20 mg one bedtime until cough is completely gone   Ok to use delsym for cough suppression  Start Iron twice daily to correct your anemia  You do not need a cardiac appt at this point based on your echocardiogram 12/12/11  GERD (REFLUX)  is an extremely common cause of respiratory symptoms, many times with no significant heartburn at all.    It can be treated with medication, but also with lifestyle changes including avoidance of late meals, excessive alcohol, smoking cessation, and avoid fatty foods, chocolate, peppermint, colas, red wine, and acidic juices such as orange juice.  NO MINT OR MENTHOL PRODUCTS SO NO COUGH DROPS  USE SUGARLESS CANDY INSTEAD (jolley ranchers or Stover's)  NO OIL BASED VITAMINS - use powdered substitutes.  Please schedule a follow up office visit in 4 weeks, sooner if needed

## 2011-12-24 NOTE — Progress Notes (Signed)
  Subjective:    Patient ID: Jill Odom, female    DOB: [birth year]  MRN: [id number]  HPI  49 yobf never smoker with onset " recurrent bronchitis" sev times per year since around 2010 just treated with prn saba and abx  and did fine even with IUP but  Then underwent Csection December 06 2011  complicated by sob/ fluid overload requiring admit to  [facility] 6/14  with a nl echo but Hct 24 and? Pulmonary edema rx with diuretics and sob improved but still coughing so referred to pulmonary clinic 12/24/2011 for asthma eval.   12/24/2011 1st pulmonary eval not nursing cc abrupt onset cough p C sectiono June 3  prod> thick white assoc with nasal congestion and some purulent nasal secretions better p saba at least for a few hours.  Sob with activity and symptoms and need for saba mostly daytime.  Had one episode of tachycardia post op ( while anemic and using lots of saba) but denies further epsides since discharge from [facility].  No sinus pain but some sneezing.  Sleeping ok without nocturnal  or early am exacerbation  of respiratory  c/o's or need for noct saba. Also denies any obvious fluctuation of symptoms with weather or environmental changes or other aggravating or alleviating factors except as outlined above     Review of Systems  Constitutional: Positive for chills. Negative for fever and unexpected weight change.  HENT: Positive for rhinorrhea and sneezing. Negative for ear pain, nosebleeds, congestion, sore throat, trouble swallowing, dental problem, voice change, postnasal drip and sinus pressure.   Eyes: Negative for visual disturbance.  Respiratory: Positive for cough and shortness of breath. Negative for choking.   Cardiovascular: Negative for chest pain and leg swelling.  Gastrointestinal: Positive for diarrhea. Negative for vomiting and abdominal pain.  Genitourinary: Negative for difficulty urinating.  Musculoskeletal: Negative for arthralgias.  Skin: Negative for rash.  Neurological: Negative for  tremors, syncope and headaches.  Hematological: Does not bruise/bleed easily.       Objective:   Physical Exam  amb bf slt nasal tone to voice/ nad HEENT: nl dentition,  , and orophanx - mod nonspecific turbinate edema/ no polyps seen. Nl external ear canals without cough reflex   NECK :  without JVD/Nodes/TM/ nl carotid upstrokes bilaterally   LUNGS: no acc muscle use, trace end exp wheeze.   CV:  RRR  no s3 or murmur or increase in P2, no edema   ABD:  soft and nontender with nl excursion in the supine position. No bruits or organomegaly, bowel sounds nl  MS:  warm without deformities, calf tenderness, cyanosis or clubbing  SKIN: warm and dry without lesions    NEURO:  alert, approp, no deficits   cxr 12/17/11 Central mild vascular congestion and mild perihilar interstitial  prominence. Mild interstitial edema cannot be excluded. No focal  infiltrate. Mild basilar atelectasis.      Assessment & Plan:

## 2011-12-25 DIAGNOSIS — J45909 Unspecified asthma, uncomplicated: Secondary | ICD-10-CM | POA: Insufficient documentation

## 2011-12-25 NOTE — Assessment & Plan Note (Addendum)
Symptoms and hx are   disproportionate to objective findings and not clear this is all a lung problem but pt does appear to have difficult airway management issues.  DDX of  difficult airways managment all start with A and  include Adherence, Ace Inhibitors, Acid Reflux, Active Sinus Disease, Alpha 1 Antitripsin deficiency, Anxiety masquerading as Airways dz,  ABPA,  allergy(esp in young), Aspiration (esp in elderly), Adverse effects of DPI,  Active smokers, plus two Bs  = Bronchiectasis and Beta blocker use..and one C= CHF  Adherence is always the initial "prime suspect" and is a multilayered concern that requires a "trust but verify" approach in every patient - starting with knowing how to use medications, especially inhalers, correctly, keeping up with refills and understanding the fundamental difference between maintenance and prns vs those medications only taken for a very short course and then stopped and not refilled. The proper method of use, as well as anticipated side effects, of a metered-dose inhaler are discussed and demonstrated to the patient. Improved effectiveness after extensive coaching during this visit to a level of approximately  50%  ? Active/ acute sinus dz > augmentin x 10 days  And prednisone x 6 days then regroup.  ? Acid reflux > diet rx plus suppress acid when actively coughing  ? chf > strongly doubt though does sound vol overloaded post op, echo looked fine

## 2011-12-25 NOTE — Assessment & Plan Note (Addendum)
Lab Results  Component Value Date   WBC 8.3 12/17/2011   HGB 8.2* 12/17/2011   HCT 26.8* 12/17/2011   MCV 82.7 12/17/2011   PLT 411* 12/17/2011   Fesat 7% so likley this is Fe def anemia post c section > rec nuiron 150 mg bid and recheck cbc one month as may be contributing to sob and ? To mild high output chf as well

## 2012-01-11 ENCOUNTER — Institutional Professional Consult (permissible substitution): Payer: Medicaid Other | Admitting: Internal Medicine

## 2012-01-27 ENCOUNTER — Encounter: Payer: Medicaid Other | Admitting: Internal Medicine

## 2012-01-27 NOTE — Progress Notes (Signed)
 This encounter was created in error - please disregard.

## 2012-02-02 ENCOUNTER — Ambulatory Visit (INDEPENDENT_AMBULATORY_CARE_PROVIDER_SITE_OTHER): Payer: Medicaid Other | Admitting: Internal Medicine

## 2012-02-02 ENCOUNTER — Encounter: Payer: Self-pay | Admitting: Internal Medicine

## 2012-02-02 VITALS — BP 110/76 | HR 74 | Temp 98.9°F | Ht 63.5 in | Wt 291.0 lb

## 2012-02-02 DIAGNOSIS — D649 Anemia, unspecified: Secondary | ICD-10-CM

## 2012-02-02 DIAGNOSIS — J45909 Unspecified asthma, uncomplicated: Secondary | ICD-10-CM

## 2012-02-02 NOTE — Patient Instructions (Addendum)
Next episode cough or wheezing or really any respiratory flare for any reason > Try prilosec 20mg   Take 30-60 min before first meal of the day and Pepcid 20 mg one bedtime until cough is completely gone for at least a week without the need for cough suppression.

## 2012-02-02 NOTE — Progress Notes (Signed)
  Subjective:    Patient ID: Jill Odom, female    DOB: 1986-05-18  MRN: [id number]  HPI  26 yobf never smoker with onset " recurrent bronchitis" sev times per year since around 2010 just treated with prn saba and abx  and did fine even with IUP but  Then underwent Csection December 06 2011  complicated by sob/ fluid overload requiring admit to  [facility] 6/14  with a nl echo but Hct 24 and ? Pulmonary edema rx with diuretics and sob improved but still coughing so referred to pulmonary clinic 12/24/2011 for asthma eval.   12/24/2011 1st pulmonary eval not nursing cc abrupt onset cough p C sectiono June 3  prod> thick white assoc with nasal congestion and some purulent nasal secretions better p saba at least for a few hours.  Sob with activity and symptoms and need for saba mostly daytime.  Had one episode of tachycardia post op ( while anemic and using lots of saba) but denies further epsides since discharge from [facility].  No sinus pain but some sneezing. rec Work on inhaler technique:  Prednisone 10 mg take  4 each am x 2 days,   2 each am x 2 days,  1 each am x2days and stop  Augmentin 875 twice daily x 10 days Try prilosec 20mg   Take 30-60 min before first meal of the day and Pepcid 20 mg one bedtime until cough is completely gone  Ok to use delsym for cough suppression Start Iron twice daily to correct your anemia You do not need a cardiac appt at this point based on your echocardiogram 12/12/11 GERD diet reviewed  01/27/2012 f/u ov/Breslin Burklow cc missed appt  02/02/2012 f/u ov/Lui Bellis cc breathing   all better, only on Iron rx, no gerd or saba needed.  No unusual cough, purulent sputum or sinus/hb symptoms on present rx.  Sleeping ok without nocturnal  or early am exacerbation  of respiratory  c/o's or need for noct saba. Also denies any obvious fluctuation of symptoms with weather or environmental changes or other aggravating or alleviating factors except as outlined above.       .       Objective:   Physical Exam   02/02/12 wt 291  Wt Readings from Last 3 Encounters:  12/24/11 296 lb (134.265 kg)  12/13/11 329 lb 2.4 oz (149.3 kg)  12/06/11 327 lb (148.326 kg)   amb bf  nad HEENT: nl dentition,  , and orophanx - mod nonspecific turbinate edema/ no polyps seen. Nl external ear canals without cough reflex   NECK :  without JVD/Nodes/TM/ nl carotid upstrokes bilaterally   LUNGS: no acc muscle use, trace end exp wheeze.   CV:  RRR  no s3 or murmur or increase in P2, no edema   ABD:  soft and nontender with nl excursion in the supine position. No bruits or organomegaly, bowel sounds nl  MS:  warm without deformities, calf tenderness, cyanosis or clubbing  SKIN: warm and dry without lesions    NEURO:  alert, approp, no deficits   cxr 12/17/11 Central mild vascular congestion and mild perihilar interstitial  prominence. Mild interstitial edema cannot be excluded. No focal  infiltrate. Mild basilar atelectasis.      Assessment & Plan:

## 2012-02-03 NOTE — Assessment & Plan Note (Signed)
-   hfa 50% 12/24/11> 75% 02/02/12  She appears to have intermittent asthma well controlled on prn saba  Rule of 2's reviewed including need to max gerd rx for any asthma flare  The proper method of use, as well as anticipated side effects, of a metered-dose inhaler are discussed and demonstrated to the patient. Improved effectiveness after extensive coaching during this visit to a level of approximately  75%

## 2012-02-03 NOTE — Assessment & Plan Note (Signed)
Borderline Microcyctic indices assoc with FeSat 7%  c/w fe def anemia  > rx  Nuiron 150 mg bid  12/25/2011 >>>  F/u ob/gyn planned

## 2012-12-14 ENCOUNTER — Encounter (HOSPITAL_COMMUNITY): Payer: Self-pay | Admitting: *Deleted

## 2012-12-14 ENCOUNTER — Emergency Department (HOSPITAL_COMMUNITY)
Admission: EM | Admit: 2012-12-14 | Discharge: 2012-12-14 | Disposition: A | Discharge status: Home / Self Care | Payer: Medicaid Other | Attending: Emergency Medicine | Admitting: Emergency Medicine

## 2012-12-14 DIAGNOSIS — R112 Nausea with vomiting, unspecified: Secondary | ICD-10-CM | POA: Insufficient documentation

## 2012-12-14 DIAGNOSIS — J45909 Unspecified asthma, uncomplicated: Secondary | ICD-10-CM | POA: Insufficient documentation

## 2012-12-14 DIAGNOSIS — R509 Fever, unspecified: Secondary | ICD-10-CM | POA: Insufficient documentation

## 2012-12-14 DIAGNOSIS — K529 Noninfective gastroenteritis and colitis, unspecified: Secondary | ICD-10-CM

## 2012-12-14 DIAGNOSIS — Z79899 Other long term (current) drug therapy: Secondary | ICD-10-CM | POA: Insufficient documentation

## 2012-12-14 DIAGNOSIS — R5381 Other malaise: Secondary | ICD-10-CM | POA: Insufficient documentation

## 2012-12-14 DIAGNOSIS — N898 Other specified noninflammatory disorders of vagina: Secondary | ICD-10-CM | POA: Insufficient documentation

## 2012-12-14 DIAGNOSIS — R1084 Generalized abdominal pain: Secondary | ICD-10-CM | POA: Insufficient documentation

## 2012-12-14 DIAGNOSIS — K5289 Other specified noninfective gastroenteritis and colitis: Secondary | ICD-10-CM | POA: Insufficient documentation

## 2012-12-14 DIAGNOSIS — Z3202 Encounter for pregnancy test, result negative: Secondary | ICD-10-CM | POA: Insufficient documentation

## 2012-12-14 LAB — URINE MICROSCOPIC-ADD ON

## 2012-12-14 LAB — COMPREHENSIVE METABOLIC PANEL
ALT: 12 U/L (ref 0–35)
Calcium: 10.2 mg/dL (ref 8.4–10.5)
GFR calc Af Amer: >90 mL/min (ref >90)
Glucose, Bld: 87 mg/dL (ref 70–99)
Sodium: 134 mEq/L — ABNORMAL LOW (ref 135–145)
Total Protein: 8.3 g/dL (ref 6.0–8.3)

## 2012-12-14 LAB — CBC WITH DIFFERENTIAL/PLATELET
Eosinophils Absolute: 0.1 10*3/uL (ref 0.0–0.7)
Eosinophils Relative: 1 % (ref 0–5)
Lymphs Abs: 1.5 10*3/uL (ref 0.7–4.0)
MCH: 24.7 pg — ABNORMAL LOW (ref 26.0–34.0)
MCV: 77.3 fL — ABNORMAL LOW (ref 78.0–100.0)
Platelets: 422 10*3/uL — ABNORMAL HIGH (ref 150–400)
RBC: 4.45 MIL/uL (ref 3.87–5.11)
RDW: 16.3 % — ABNORMAL HIGH (ref 11.5–15.5)

## 2012-12-14 LAB — PREGNANCY, URINE: Preg Test, Ur: NEGATIVE

## 2012-12-14 LAB — URINALYSIS, ROUTINE W REFLEX MICROSCOPIC
Leukocytes, UA: NEGATIVE
Nitrite: NEGATIVE
Specific Gravity, Urine: 1.03 (ref 1.005–1.030)
Urobilinogen, UA: 1 mg/dL (ref 0.0–1.0)

## 2012-12-14 LAB — LIPASE, BLOOD: Lipase: 15 U/L (ref 11–59)

## 2012-12-14 MED ORDER — ONDANSETRON 4 MG PO TBDP: ORAL_TABLET | ORAL | Status: DC

## 2012-12-14 MED ORDER — SODIUM CHLORIDE 0.9 % IV BOLUS (SEPSIS)
1000.0000 mL | Freq: Once | INTRAVENOUS | Status: AC
  Administered 2012-12-14: 1000 mL via INTRAVENOUS

## 2012-12-14 MED ORDER — ONDANSETRON HCL 4 MG/2ML IJ SOLN
4.0000 mg | Freq: Once | INTRAMUSCULAR | Status: AC
  Administered 2012-12-14: 4 mg via INTRAVENOUS
  Filled 2012-12-14: qty 2

## 2012-12-14 NOTE — ED Notes (Signed)
Pt given water as an oral trial. Able to tolerate without vomiting. Requesting to be discharged. MD Ranae Palms made aware.

## 2012-12-14 NOTE — ED Provider Notes (Signed)
History     CSN: [id number]  Arrival date & time 12/14/12  0710   First MD Initiated Contact with Patient 12/14/12 [phone]      Chief Complaint  Patient presents with  . Emesis  . Diarrhea  . Fever    (Consider location/radiation/quality/duration/timing/severity/associated sxs/prior treatment) HPI Pt present with 2 days of vomiting and diarrhea. + subjective fever and generalized abd cramping. No sick contacts, recent travel or dietary changes. No blood in vomit or diarrhea. Pt states she is on her period for the past weak. No known vaginal d/c.  Past Medical History  Diagnosis Date  . Asthma     Past Surgical History  Procedure Laterality Date  . Cesarean section  12/06/2011    Procedure: CESAREAN SECTION;  Surgeon: Oliver Pila, MD;  Location: [facility];  Service: Gynecology;  Laterality: N/A;    Family History  Problem Relation Age of Onset  . Asthma Father     History  Substance Use Topics  . Smoking status: Never Smoker   . Smokeless tobacco: Never Used  . Alcohol Use: No    OB History   Grav Para Term Preterm Abortions TAB SAB Ect Mult Living   1 1 1  0 0 0 0 0 0 1      Review of Systems  Constitutional: Positive for fever and fatigue. Negative for chills.  Respiratory: Negative for cough and shortness of breath.   Cardiovascular: Negative for chest pain.  Gastrointestinal: Positive for nausea, vomiting, abdominal pain and diarrhea. Negative for constipation, blood in stool and abdominal distention.  Genitourinary: Positive for vaginal bleeding. Negative for dysuria, frequency, flank pain, vaginal discharge and pelvic pain.  Musculoskeletal: Negative for myalgias and back pain.  Skin: Negative for rash and wound.  Neurological: Negative for dizziness, weakness, light-headedness, numbness and headaches.  All other systems reviewed and are negative.    Allergies  Review of patient's allergies indicates no known allergies.  Home Medications   Current  Outpatient Rx  Name  Route  Sig  Dispense  Refill  . acetaminophen (TYLENOL) 500 MG tablet   Oral   Take 1,000 mg by mouth every 6 (six) hours as needed for pain.         Marland Kitchen albuterol (PROVENTIL HFA;VENTOLIN HFA) 108 (90 BASE) MCG/ACT inhaler   Inhalation   Inhale 2 puffs into the lungs every 6 (six) hours as needed. For shortness of breath.         . loratadine (CLARITIN) 10 MG tablet   Oral   Take 10 mg by mouth daily as needed for allergies.         Marland Kitchen ondansetron (ZOFRAN ODT) 4 MG disintegrating tablet      4mg  ODT q4 hours prn nausea/vomit   8 tablet   0     BP 128/79  Pulse 94  Temp(Src) 99 F (37.2 C) (Oral)  Resp 18  Ht 5\' 3"  (1.6 m)  Wt 280 lb (127.007 kg)  BMI 49.61 kg/m2  SpO2 98%  LMP 12/06/2012  Physical Exam  Nursing note and vitals reviewed. Constitutional: She is oriented to person, place, and time. She appears well-developed and well-nourished. No distress.  HENT:  Head: Normocephalic and atraumatic.  Mouth/Throat: Oropharynx is clear and moist.  Eyes: EOM are normal. Pupils are equal, round, and reactive to light.  Neck: Normal range of motion. Neck supple.  Cardiovascular: Normal rate and regular rhythm.   Pulmonary/Chest: Effort normal and breath sounds normal. No respiratory distress.  She has no wheezes. She has no rales.  Abdominal: Soft. Bowel sounds are normal. She exhibits no distension and no mass. There is no tenderness. There is no rebound and no guarding.  Musculoskeletal: Normal range of motion. She exhibits no edema and no tenderness.  Neurological: She is alert and oriented to person, place, and time.  Moves all ext, sensation intact  Skin: Skin is warm and dry. No rash noted. No erythema.  Psychiatric: She has a normal mood and affect. Her behavior is normal.    ED Course  Procedures (including critical care time)  Labs Reviewed  CBC WITH DIFFERENTIAL - Abnormal; Notable for the following:    Hemoglobin 11.0 (*)    HCT 34.4  (*)    MCV 77.3 (*)    MCH 24.7 (*)    RDW 16.3 (*)    Platelets 422 (*)    All other components within normal limits  COMPREHENSIVE METABOLIC PANEL - Abnormal; Notable for the following:    Sodium 134 (*)    Albumin 3.3 (*)    All other components within normal limits  URINALYSIS, ROUTINE W REFLEX MICROSCOPIC - Abnormal; Notable for the following:    Color, Urine AMBER (*)    Hgb urine dipstick LARGE (*)    All other components within normal limits  LIPASE, BLOOD  PREGNANCY, URINE  URINE MICROSCOPIC-ADD ON   No results found.   1. Gastroenteritis       MDM  Pt states she is feeling better after meds and IVF's. Tolerating PO's. Return precautions given.         Loren Racer, MD 12/14/12 [phone]

## 2012-12-14 NOTE — ED Notes (Signed)
Pt reports vomiting, diarrhea, fever x 2 days. Vomited x 15 in last 24 hours, Diarrhea x 8 in last 24 hours. Reports generalized abdominal pain. Felt like had a fever yesterday.

## 2012-12-14 NOTE — ED Notes (Signed)
Ambulated to restroom  

## 2013-04-29 ENCOUNTER — Emergency Department (HOSPITAL_COMMUNITY)
Admission: EM | Admit: 2013-04-29 | Discharge: 2013-04-30 | Disposition: A | Discharge status: Home / Self Care | Payer: No Typology Code available for payment source | Attending: Emergency Medicine | Admitting: Emergency Medicine

## 2013-04-29 ENCOUNTER — Encounter (HOSPITAL_COMMUNITY): Payer: Self-pay | Admitting: Emergency Medicine

## 2013-04-29 DIAGNOSIS — R5381 Other malaise: Secondary | ICD-10-CM | POA: Insufficient documentation

## 2013-04-29 DIAGNOSIS — R110 Nausea: Secondary | ICD-10-CM | POA: Insufficient documentation

## 2013-04-29 DIAGNOSIS — J351 Hypertrophy of tonsils: Secondary | ICD-10-CM | POA: Insufficient documentation

## 2013-04-29 DIAGNOSIS — J3489 Other specified disorders of nose and nasal sinuses: Secondary | ICD-10-CM | POA: Insufficient documentation

## 2013-04-29 DIAGNOSIS — J45909 Unspecified asthma, uncomplicated: Secondary | ICD-10-CM | POA: Insufficient documentation

## 2013-04-29 DIAGNOSIS — R05 Cough: Secondary | ICD-10-CM | POA: Insufficient documentation

## 2013-04-29 DIAGNOSIS — R059 Cough, unspecified: Secondary | ICD-10-CM | POA: Insufficient documentation

## 2013-04-29 DIAGNOSIS — J029 Acute pharyngitis, unspecified: Secondary | ICD-10-CM | POA: Insufficient documentation

## 2013-04-29 HISTORY — DX: Gastro-esophageal reflux disease without esophagitis: K21.9

## 2013-04-29 LAB — RAPID STREP SCREEN (MED CTR MEBANE ONLY): Streptococcus, Group A Screen (Direct): NEGATIVE

## 2013-04-29 NOTE — ED Notes (Signed)
Pt c/o sore throat onset yesterday. Difficulty eating/drinking. +nausea. Denies v/d

## 2013-04-30 MED ORDER — MAGIC MOUTHWASH W/LIDOCAINE: 5.0000 mL | Freq: Three times a day (TID) | ORAL | Status: DC | PRN

## 2013-04-30 MED ORDER — NAPROXEN 500 MG PO TABS: 500.0000 mg | ORAL_TABLET | Freq: Two times a day (BID) | ORAL | Status: DC

## 2013-04-30 MED ORDER — IBUPROFEN 800 MG PO TABS
800.0000 mg | ORAL_TABLET | Freq: Once | ORAL | Status: AC
  Administered 2013-04-30: 800 mg via ORAL
  Filled 2013-04-30: qty 1

## 2013-04-30 NOTE — ED Provider Notes (Signed)
CSN: [id number]     Arrival date & time 04/29/13  2302 History   First MD Initiated Contact with Patient 04/30/13 0006     Chief Complaint  Patient presents with  . Sore Throat   HPI  History provided by the patient. The patient is a 27 year old female who presents with complaints of sore throat, congestion, rhinorrhea and slight cough. Symptoms first began yesterday with scratchy and sore throat. Patient began having some increased fatigue and body aches with occasional coughing. Patient has not used any medications or treatment for symptoms. Propane is worse with eating and drinking. She has occasional nausea but denies any episodes of vomiting. No diarrhea or constipation. No chest pain or shortness of breath. No other aggravating or alleviating factors. No other associated symptoms.    Past Medical History  Diagnosis Date  . Asthma   . GERD (gastroesophageal reflux disease)    Past Surgical History  Procedure Laterality Date  . Cesarean section  12/06/2011    Procedure: CESAREAN SECTION;  Surgeon: Oliver Pila, MD;  Location: [facility];  Service: Gynecology;  Laterality: N/A;   Family History  Problem Relation Age of Onset  . Asthma Father    History  Substance Use Topics  . Smoking status: Never Smoker   . Smokeless tobacco: Never Used  . Alcohol Use: No   OB History   Grav Para Term Preterm Abortions TAB SAB Ect Mult Living   1 1 1  0 0 0 0 0 0 1     Review of Systems  Constitutional: Positive for chills and fatigue. Negative for fever and appetite change.  HENT: Positive for congestion, rhinorrhea and sore throat.   Respiratory: Positive for cough.   Gastrointestinal: Positive for nausea. Negative for vomiting, abdominal pain, diarrhea and constipation.  Skin: Negative for rash.  All other systems reviewed and are negative.    Allergies  Review of patient's allergies indicates no known allergies.  Home Medications   Current Outpatient Rx  Name  Route  Sig   Dispense  Refill  . Chlorpheniramine-DM (COUGH & COLD PO)   Oral   Take 1 tablet by mouth every 6 (six) hours as needed (cough).         Marland Kitchen ibuprofen (ADVIL,MOTRIN) 200 MG tablet   Oral   Take 400 mg by mouth every 6 (six) hours as needed for pain.         Marland Kitchen albuterol (PROVENTIL HFA;VENTOLIN HFA) 108 (90 BASE) MCG/ACT inhaler   Inhalation   Inhale 2 puffs into the lungs every 6 (six) hours as needed for wheezing or shortness of breath. For shortness of breath.         . Alum & Mag Hydroxide-Simeth (MAGIC MOUTHWASH W/LIDOCAINE) SOLN   Oral   Take 5 mLs by mouth 3 (three) times daily as needed.   50 mL   0   . naproxen (NAPROSYN) 500 MG tablet   Oral   Take 1 tablet (500 mg total) by mouth 2 (two) times daily.   30 tablet   0    BP 105/40  Pulse 109  Temp(Src) 99.2 F (37.3 C) (Oral)  Resp 18  Ht 5\' 4"  (1.626 m)  Wt 315 lb (142.883 kg)  BMI 54.04 kg/m2  SpO2 100%  LMP 12/28/2012  Breastfeeding? No Physical Exam  Nursing note and vitals reviewed. Constitutional: She is oriented to person, place, and time. She appears well-developed and well-nourished. No distress.  HENT:  Head: Normocephalic.  Right  Ear: Tympanic membrane normal.  Left Ear: Tympanic membrane normal.  Nose: Rhinorrhea present.  Mild edema of bilateral tonsils. No significant erythema. Slight exudate around left tonsil. Uvula midline. No signs for PTA.  Eyes: Conjunctivae are normal.  Neck: Normal range of motion. Neck supple.  No meningeal signs  Cardiovascular: Normal rate and regular rhythm.   Pulmonary/Chest: Effort normal and breath sounds normal. No respiratory distress. She has no wheezes. She has no rales.  Abdominal: Soft.  Musculoskeletal: Normal range of motion.  Lymphadenopathy:    She has no cervical adenopathy.  Neurological: She is alert and oriented to person, place, and time.  Skin: Skin is warm and dry. No rash noted.  Psychiatric: She has a normal mood and affect. Her  behavior is normal.    ED Course  Procedures    Patient seen and evaluated. Patient well appearing in no acute distress. Does not appear severely ill or toxic. Patient with multiple symptoms. Strep test negative. At this time suspect viral process. I did discuss with patient pending strep culture. At this time we'll treat symptomatically.   Results for orders placed during the hospital encounter of 04/29/13  RAPID STREP SCREEN      Result Value Range   Streptococcus, Group A Screen (Direct) NEGATIVE  NEGATIVE       MDM   1. Sore throat   2. Viral pharyngitis        Angus Seller, PA-C 04/30/13 0041

## 2013-04-30 NOTE — ED Provider Notes (Signed)
Medical screening examination/treatment/procedure(s) were performed by non-physician practitioner and as supervising physician I was immediately available for consultation/collaboration.  EKG Interpretation   None         Hanley Seamen, MD 04/30/13 [phone]

## 2013-05-01 ENCOUNTER — Encounter (HOSPITAL_COMMUNITY): Payer: Self-pay | Admitting: Emergency Medicine

## 2013-05-01 ENCOUNTER — Emergency Department (HOSPITAL_COMMUNITY)
Admission: EM | Admit: 2013-05-01 | Discharge: 2013-05-01 | Disposition: A | Discharge status: Home / Self Care | Payer: Medicaid Other | Attending: Emergency Medicine | Admitting: Emergency Medicine

## 2013-05-01 DIAGNOSIS — Z8719 Personal history of other diseases of the digestive system: Secondary | ICD-10-CM | POA: Insufficient documentation

## 2013-05-01 DIAGNOSIS — J029 Acute pharyngitis, unspecified: Secondary | ICD-10-CM

## 2013-05-01 DIAGNOSIS — J45901 Unspecified asthma with (acute) exacerbation: Secondary | ICD-10-CM | POA: Insufficient documentation

## 2013-05-01 DIAGNOSIS — Z79899 Other long term (current) drug therapy: Secondary | ICD-10-CM | POA: Insufficient documentation

## 2013-05-01 DIAGNOSIS — R112 Nausea with vomiting, unspecified: Secondary | ICD-10-CM | POA: Insufficient documentation

## 2013-05-01 LAB — CULTURE, GROUP A STREP

## 2013-05-01 MED ORDER — PENICILLIN G BENZATHINE 1200000 UNIT/2ML IM SUSP
1.2000 10*6.[IU] | Freq: Once | INTRAMUSCULAR | Status: AC
  Administered 2013-05-01: 1.2 10*6.[IU] via INTRAMUSCULAR
  Filled 2013-05-01: qty 2

## 2013-05-01 MED ORDER — MAGIC MOUTHWASH
15.0000 mL | Freq: Once | ORAL | Status: AC
  Administered 2013-05-01: 15 mL via ORAL
  Filled 2013-05-01: qty 15

## 2013-05-01 MED ORDER — METRONIDAZOLE 500 MG PO TABS: 500.0000 mg | ORAL_TABLET | Freq: Two times a day (BID) | ORAL | Status: DC

## 2013-05-01 MED ORDER — PENICILLIN G POTASSIUM 5000000 UNITS IJ SOLR: 4.0000 10*6.[IU] | Freq: Once | INTRAVENOUS | Status: DC

## 2013-05-01 NOTE — ED Provider Notes (Signed)
CSN: [id number]     Arrival date & time 05/01/13  4098 History   First MD Initiated Contact with Patient 05/01/13 0730     Chief Complaint  Patient presents with  . Sore Throat   (Consider location/radiation/quality/duration/timing/severity/associated sxs/prior Treatment) Patient is a 27 y.o. female presenting with pharyngitis.  Sore Throat Associated symptoms include chills, nausea, a sore throat and vomiting. Pertinent negatives include no abdominal pain, chest pain or congestion.   Patient is a 27 yo female with history of asthma and GERD who presents with continued sore throat. Started on Friday. Notes sore throat since that time. Was seen in the ED on Sunday and diagnosed with viral pharyngitis as rapid strep screen was negative. She was advised to use naproxen and miracle mouthwash for pain control. She has continued to have sore throat. Developed subjective fevers on Sunday. Has small amount of cough. Endorses some feeling of shortness of breath on laying down. Improves on sitting up. Denies chest pain. Notes nausea and vomiting when swallowing the miracle mouthwash. No congestion, rhinorrhea, ear pain. Notably she has not been sexually active in about a month. Notes last oral sex was in April. Notes sick contacts in nieces, nephews, and son.  Past Medical History  Diagnosis Date  . Asthma   . GERD (gastroesophageal reflux disease)    Past Surgical History  Procedure Laterality Date  . Cesarean section  12/06/2011    Procedure: CESAREAN SECTION;  Surgeon: Oliver Pila, MD;  Location: [facility];  Service: Gynecology;  Laterality: N/A;   Family History  Problem Relation Age of Onset  . Asthma Father    History  Substance Use Topics  . Smoking status: Never Smoker   . Smokeless tobacco: Never Used  . Alcohol Use: No   OB History   Grav Para Term Preterm Abortions TAB SAB Ect Mult Living   1 1 1  0 0 0 0 0 0 1     Review of Systems  Constitutional: Positive for chills.        Positive for subjective fever  HENT: Positive for sore throat. Negative for congestion, drooling, ear pain, rhinorrhea and trouble swallowing.   Respiratory: Negative for chest tightness and shortness of breath.   Cardiovascular: Negative for chest pain.  Gastrointestinal: Positive for nausea and vomiting. Negative for abdominal pain.    Allergies  Review of patient's allergies indicates no known allergies.  Home Medications   Current Outpatient Rx  Name  Route  Sig  Dispense  Refill  . acetaminophen (TYLENOL) 325 MG tablet   Oral   Take 650 mg by mouth every 6 (six) hours as needed for pain.         Marland Kitchen albuterol (PROVENTIL HFA;VENTOLIN HFA) 108 (90 BASE) MCG/ACT inhaler   Inhalation   Inhale 2 puffs into the lungs every 6 (six) hours as needed for wheezing or shortness of breath. For shortness of breath.         . Alum & Mag Hydroxide-Simeth (MAGIC MOUTHWASH W/LIDOCAINE) SOLN   Oral   Take 5 mLs by mouth 3 (three) times daily as needed.   50 mL   0   . Chlorpheniramine-DM (COUGH & COLD PO)   Oral   Take 1 tablet by mouth every 6 (six) hours as needed (cough).         Marland Kitchen ibuprofen (ADVIL,MOTRIN) 200 MG tablet   Oral   Take 400 mg by mouth every 6 (six) hours as needed for pain.         Marland Kitchen  naproxen (NAPROSYN) 500 MG tablet   Oral   Take 1 tablet (500 mg total) by mouth 2 (two) times daily.   30 tablet   0   . metroNIDAZOLE (FLAGYL) 500 MG tablet   Oral   Take 1 tablet (500 mg total) by mouth 2 (two) times daily.   14 tablet   0    BP 107/58  Pulse 92  Temp(Src) 99.2 F (37.3 C) (Oral)  Resp 19  Ht 5\' 4"  (1.626 m)  Wt 315 lb (142.883 kg)  BMI 54.04 kg/m2  SpO2 100%  LMP 12/28/2012 Physical Exam  Constitutional: She appears well-developed and well-nourished.  HENT:  Head: Normocephalic and atraumatic.  Mouth/Throat: Oropharyngeal exudate present.  Enlarged tonsils bilaterally, bilateral TMs normal, no uvula deviation  Eyes: Conjunctivae are  normal. Pupils are equal, round, and reactive to light. Right eye exhibits no discharge. Left eye exhibits no discharge.  Cardiovascular: Normal rate, regular rhythm and normal heart sounds.   Pulmonary/Chest: Effort normal and breath sounds normal.  Abdominal: Soft. Bowel sounds are normal. She exhibits no distension and no mass. There is no tenderness. There is no rebound and no guarding.  Musculoskeletal: She exhibits no edema.  Lymphadenopathy:    She has cervical adenopathy (bilateral enlarged cervical lymph nodes).  Neurological: She is alert.  Skin: Skin is warm and dry.    ED Course  Procedures (including critical care time) Labs Review Labs Reviewed  MONONUCLEOSIS SCREEN   Imaging Review No results found.  EKG Interpretation   None       MDM   1. Pharyngitis    Patient seen and examined. Patient with enlarged tonsils with exudate and cervical lymphadenopathy and pain with swallowing. She has no difficulty handling secretions at this time.This in combination with fatigue and negative rapid strep makes mononucleosis a possible cause of these symptoms. Must also consider other causes of pharyngitis including fusobacterium and gonorrhea, as well as other viral causes. Patient endorses no oral sex since April, so gonorrhea is unlikely. Monospot negative. Will treat for potential fusobacterium infection with bicillin IM and flagyl 500 mg BID for 7 days. Will treat symptomatically here with magic mouthwash. Discussed this with patient and she voiced understanding. Advised symptomatic treatment and antibiotics at home and to continue naproxen for discomfort and miracle mouthwash with swish and spit at home for sore throat. Discussed return precautions with the patient and she voiced understanding.  This patient was discussed and seen with my attending Dr Hyacinth Meeker.  Marikay Alar, MD [facility] PGY-2 05/01/13 4:16 pm  Glori Luis, MD 05/01/13 1620

## 2013-05-01 NOTE — ED Notes (Signed)
C/o sore throat since Friday, seen in ED for same Sunday. States not improving even with meds prescribed

## 2013-05-01 NOTE — ED Provider Notes (Signed)
27 year old female, history of 4 days of sore throat, seen yesterday for same and had a negative strep test. Presents with ongoing pain in her throat, pain with swallowing but no difficulty handling her secretions. She has been subjectively febrile at here her temperature is 100.0 by oral route. On exam she has significant bilateral anterior cervical chain lymphadenopathy, this is tender, her oropharynx shows bilateral creamy whitish exudate on her tonsillar pillars, no peritonsillar abscess is seen, no deviation of the uvula, moist mucous membranes, no tenderness underneath the tongue. She has no trismus or torticollis.  There is no hepatosplenomegaly or abdominal tenderness.  Further evaluation with Monospot would be indicated, symptomatic treatment, but also consider other sources of the patient's exudative pharyngitis including possible Fusobacterium, gonorrhea.  I saw and evaluated the patient, reviewed the resident's note and I agree with the findings and plan.     Vida Roller, MD 05/01/13 [phone]

## 2013-05-01 NOTE — ED Provider Notes (Addendum)
I saw and evaluated the patient, reviewed the resident's note and I agree with the findings and plan.  Please see my separate note regarding my evaluation of the patient.     Vida Roller, MD 05/01/13 1610  Vida Roller, MD 05/01/13 [phone]

## 2013-05-01 NOTE — ED Notes (Addendum)
Was at [facility] ED Sunday, had a negative strep. Reports was prescribed naproxen & miracle mouthwash which are not helping at all & having trouble keeping mouthwash down. Has had fever & chills since Sunday. Denies cold, cough, nasal congestion, ear ache. Took naproxen at [employer]

## 2013-07-07 ENCOUNTER — Emergency Department (INDEPENDENT_AMBULATORY_CARE_PROVIDER_SITE_OTHER)
Admission: EM | Admit: 2013-07-07 | Discharge: 2013-07-07 | Disposition: A | Discharge status: Home / Self Care | Payer: Self-pay | Source: Home / Self Care

## 2013-07-07 ENCOUNTER — Encounter (HOSPITAL_COMMUNITY): Payer: Self-pay | Admitting: Emergency Medicine

## 2013-07-07 DIAGNOSIS — J069 Acute upper respiratory infection, unspecified: Secondary | ICD-10-CM

## 2013-07-07 DIAGNOSIS — J020 Streptococcal pharyngitis: Secondary | ICD-10-CM

## 2013-07-07 LAB — POCT RAPID STREP A: STREPTOCOCCUS, GROUP A SCREEN (DIRECT): POSITIVE — AB

## 2013-07-07 MED ORDER — CEFDINIR 300 MG PO CAPS: 300.0000 mg | ORAL_CAPSULE | Freq: Two times a day (BID) | ORAL | Status: DC

## 2013-07-07 NOTE — Discharge Instructions (Signed)
Upper Respiratory Infection, Adult Alka-Seltzer cold plus night time relief OTC med An upper respiratory infection (URI) is also sometimes known as the common cold. The upper respiratory tract includes the nose, sinuses, throat, trachea, and bronchi. Bronchi are the airways leading to the lungs. Most people improve within 1 week, but symptoms can last up to 2 weeks. A residual cough may last even longer.  CAUSES Many different viruses can infect the tissues lining the upper respiratory tract. The tissues become irritated and inflamed and often become very moist. Mucus production is also common. A cold is contagious. You can easily spread the virus to others by oral contact. This includes kissing, sharing a glass, coughing, or sneezing. Touching your mouth or nose and then touching a surface, which is then touched by another person, can also spread the virus. SYMPTOMS  Symptoms typically develop 1 to 3 days after you come in contact with a cold virus. Symptoms vary from person to person. They may include:  Runny nose.  Sneezing.  Nasal congestion.  Sinus irritation.  Sore throat.  Loss of voice (laryngitis).  Cough.  Fatigue.  Muscle aches.  Loss of appetite.  Headache.  Low-grade fever. DIAGNOSIS  You might diagnose your own cold based on familiar symptoms, since most people get a cold 2 to 3 times a year. Your caregiver can confirm this based on your exam. Most importantly, your caregiver can check that your symptoms are not due to another disease such as strep throat, sinusitis, pneumonia, asthma, or epiglottitis. Blood tests, throat tests, and X-rays are not necessary to diagnose a common cold, but they may sometimes be helpful in excluding other more serious diseases. Your caregiver will decide if any further tests are required. RISKS AND COMPLICATIONS  You may be at risk for a more severe case of the common cold if you smoke cigarettes, have chronic heart disease (such as heart  failure) or lung disease (such as asthma), or if you have a weakened immune system. The very young and very old are also at risk for more serious infections. Bacterial sinusitis, middle ear infections, and bacterial pneumonia can complicate the common cold. The common cold can worsen asthma and chronic obstructive pulmonary disease (COPD). Sometimes, these complications can require emergency medical care and may be life-threatening. PREVENTION  The best way to protect against getting a cold is to practice good hygiene. Avoid oral or hand contact with people with cold symptoms. Wash your hands often if contact occurs. There is no clear evidence that vitamin C, vitamin E, echinacea, or exercise reduces the chance of developing a cold. However, it is always recommended to get plenty of rest and practice good nutrition. TREATMENT  Treatment is directed at relieving symptoms. There is no cure. Antibiotics are not effective, because the infection is caused by a virus, not by bacteria. Treatment may include:  Increased fluid intake. Sports drinks offer valuable electrolytes, sugars, and fluids.  Breathing heated mist or steam (vaporizer or shower).  Eating chicken soup or other clear broths, and maintaining good nutrition.  Getting plenty of rest.  Using gargles or lozenges for comfort.  Controlling fevers with ibuprofen or acetaminophen as directed by your caregiver.  Increasing usage of your inhaler if you have asthma. Zinc gel and zinc lozenges, taken in the first 24 hours of the common cold, can shorten the duration and lessen the severity of symptoms. Pain medicines may help with fever, muscle aches, and throat pain. A variety of non-prescription medicines are available  to treat congestion and runny nose. Your caregiver can make recommendations and may suggest nasal or lung inhalers for other symptoms.  HOME CARE INSTRUCTIONS   Only take over-the-counter or prescription medicines for pain,  discomfort, or fever as directed by your caregiver.  Use a warm mist humidifier or inhale steam from a shower to increase air moisture. This may keep secretions moist and make it easier to breathe.  Drink enough water and fluids to keep your urine clear or pale yellow.  Rest as needed.  Return to work when your temperature has returned to normal or as your caregiver advises. You may need to stay home longer to avoid infecting others. You can also use a face mask and careful hand washing to prevent spread of the virus. SEEK MEDICAL CARE IF:   After the first few days, you feel you are getting worse rather than better.  You need your caregiver's advice about medicines to control symptoms.  You develop chills, worsening shortness of breath, or brown or red sputum. These may be signs of pneumonia.  You develop yellow or brown nasal discharge or pain in the face, especially when you bend forward. These may be signs of sinusitis.  You develop a fever, swollen neck glands, pain with swallowing, or white areas in the back of your throat. These may be signs of strep throat. SEEK IMMEDIATE MEDICAL CARE IF:   You have a fever.  You develop severe or persistent headache, ear pain, sinus pain, or chest pain.  You develop wheezing, a prolonged cough, cough up blood, or have a change in your usual mucus (if you have chronic lung disease).  You develop sore muscles or a stiff neck. Document Released: 12/15/2000 Document Revised: 09/13/2011 Document Reviewed: 10/23/2010 [facility] Patient Information 2014 [employer], [place].Alka-Seltzer cold plus nighttime medicines

## 2013-07-07 NOTE — ED Provider Notes (Signed)
Medical screening examination/treatment/procedure(s) were performed by resident physician or non-physician practitioner and as supervising physician I was immediately available for consultation/collaboration.   Barkley BrunsKINDL,Trixie Maclaren DOUGLAS MD.   Linna HoffJames D Kerolos Nehme, MD 07/07/13 2025

## 2013-07-07 NOTE — ED Provider Notes (Signed)
CSN: [id number]     Arrival date & time 07/07/13  1021 History   First MD Initiated Contact with Patient 07/07/13 1224     Chief Complaint  Patient presents with  . Sore Throat   (Consider location/radiation/quality/duration/timing/severity/associated sxs/prior Treatment) HPI Comments: 28 year old severely obese female complaining of nasal congestion, PND, cough, sore throat 3 days. She states she feels like she may have a fever but did not measure it. She also has discomfort in her left jaw all over the eustachian tube.  Patient is a 28 y.o. female presenting with pharyngitis.  Sore Throat    Past Medical History  Diagnosis Date  . Asthma   . GERD (gastroesophageal reflux disease)    Past Surgical History  Procedure Laterality Date  . Cesarean section  12/06/2011    Procedure: CESAREAN SECTION;  Surgeon: Oliver Pila, MD;  Location: [facility];  Service: Gynecology;  Laterality: N/A;   Family History  Problem Relation Age of Onset  . Asthma Father    History  Substance Use Topics  . Smoking status: Never Smoker   . Smokeless tobacco: Never Used  . Alcohol Use: Yes   OB History   Grav Para Term Preterm Abortions TAB SAB Ect Mult Living   1 1 1  0 0 0 0 0 0 1     Review of Systems  Constitutional: Positive for fever. Negative for chills, activity change, appetite change and fatigue.  HENT: Positive for congestion, postnasal drip, rhinorrhea and sore throat. Negative for ear discharge and facial swelling.   Eyes: Negative.   Respiratory: Negative.   Cardiovascular: Negative.   Gastrointestinal: Negative.   Musculoskeletal: Negative for neck pain and neck stiffness.  Skin: Negative for pallor and rash.  Neurological: Negative.     Allergies  Review of patient's allergies indicates no known allergies.  Home Medications   Current Outpatient Rx  Name  Route  Sig  Dispense  Refill  . acetaminophen (TYLENOL) 325 MG tablet   Oral   Take 650 mg by mouth every 6 (six)  hours as needed for pain.         . Chlorpheniramine-DM (COUGH & COLD PO)   Oral   Take 1 tablet by mouth every 6 (six) hours as needed (cough).         Marland Kitchen ibuprofen (ADVIL,MOTRIN) 200 MG tablet   Oral   Take 400 mg by mouth every 6 (six) hours as needed for pain.         Marland Kitchen albuterol (PROVENTIL HFA;VENTOLIN HFA) 108 (90 BASE) MCG/ACT inhaler   Inhalation   Inhale 2 puffs into the lungs every 6 (six) hours as needed for wheezing or shortness of breath. For shortness of breath.         . naproxen (NAPROSYN) 500 MG tablet   Oral   Take 1 tablet (500 mg total) by mouth 2 (two) times daily.   30 tablet   0    BP 131/82  Pulse 80  Temp(Src) 98 F (36.7 C) (Oral)  Resp 17  SpO2 100%  LMP 06/20/2013  Breastfeeding? No Physical Exam  Nursing note and vitals reviewed. Constitutional: She is oriented to person, place, and time. She appears well-developed and well-nourished. No distress.  Severely obese  HENT:  Mouth/Throat: No oropharyngeal exudate.  Bilateral TMs are normal Oropharynx with minor erythema, large tonsils without inflammation or exudates. Clear PND  Eyes: Conjunctivae and EOM are normal.  Neck: Normal range of motion. Neck supple.  Cardiovascular:  Normal rate, regular rhythm and normal heart sounds.   Pulmonary/Chest: Effort normal and breath sounds normal. No respiratory distress.  Musculoskeletal: Normal range of motion. She exhibits no edema.  Lymphadenopathy:    She has no cervical adenopathy.  Neurological: She is alert and oriented to person, place, and time.  Skin: Skin is warm and dry. No rash noted.  Psychiatric: She has a normal mood and affect.    ED Course  Procedures (including critical care time) Labs Review Labs Reviewed  POCT RAPID STREP A ([facility] ONLY) - Abnormal; Notable for the following:    Streptococcus, Group A Screen (Direct) POSITIVE (*)    All other components within normal limits   Imaging Review No results  found. Results for orders placed during the hospital encounter of 07/07/13  POCT RAPID STREP A ([facility] ONLY)      Result Value Range   Streptococcus, Group A Screen (Direct) POSITIVE (*) NEGATIVE       MDM   1. URI (upper respiratory infection)      Alka-Seltzer cold plus nighttime relief medication OTC Drink plenty of fluids Read your URI instructions Omnicef 300 bid   Hayden Rasmussenavid Heman Que, NP 07/07/13 1241

## 2013-07-07 NOTE — ED Notes (Signed)
3 days of sore throat with fever--she did not check it, but felt warm

## 2013-07-19 IMAGING — US US OB FOLLOW-UP
1 series · 12 of 28 positions shown · non-contrast
Comparison: none

[Series 1: us ob follow up · 12 of 31 slices shown]
[im 2/31]
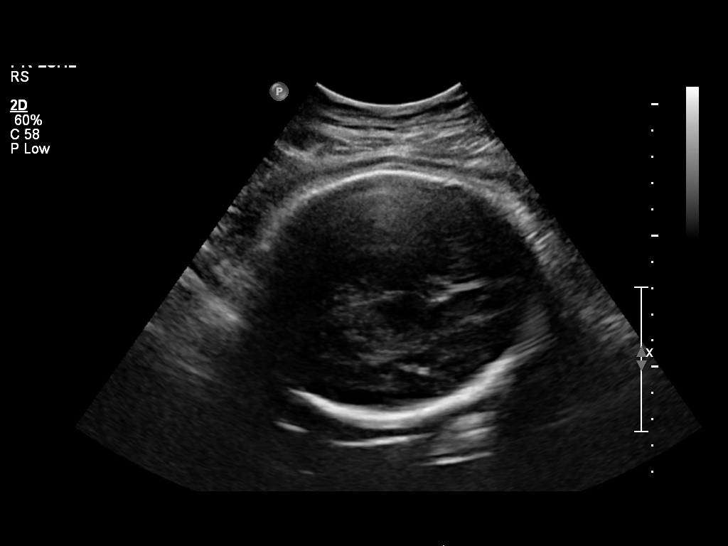
[im 4/31]
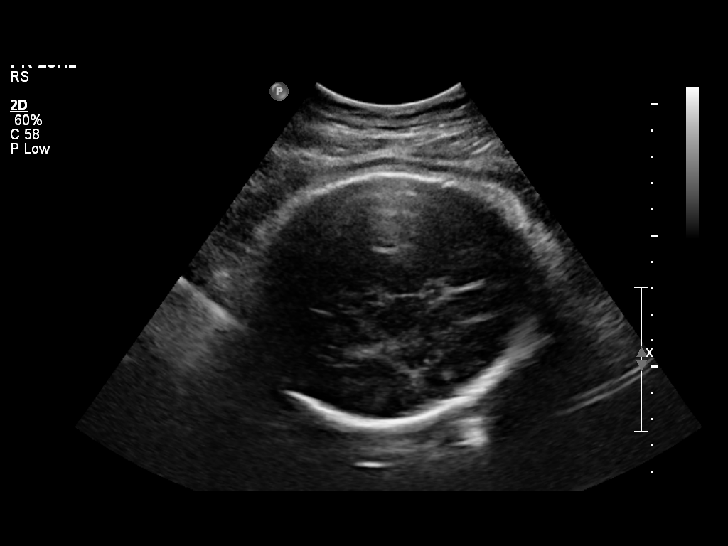
[im 6/31]
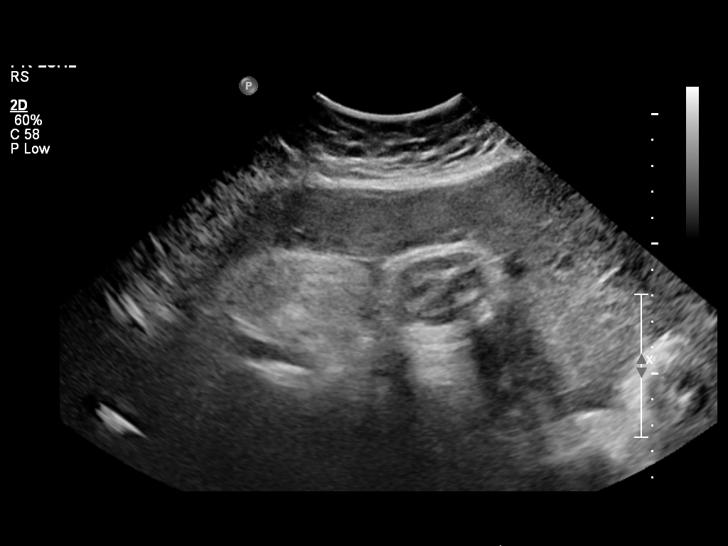
[im 9/31]
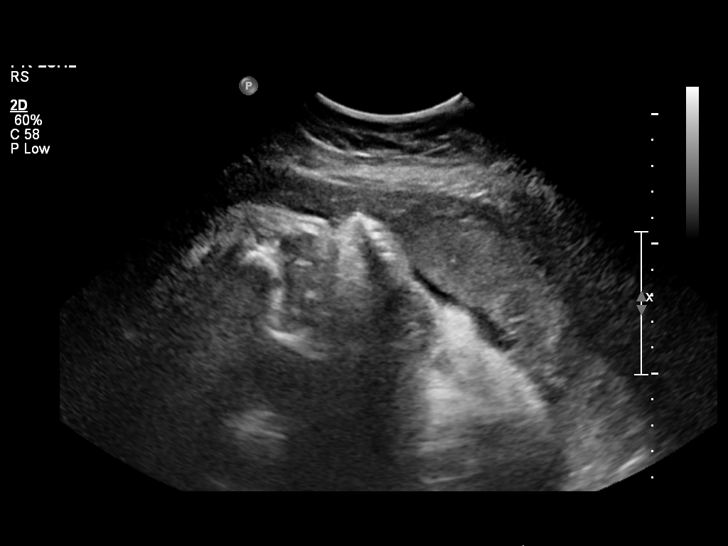
[im 12/31]
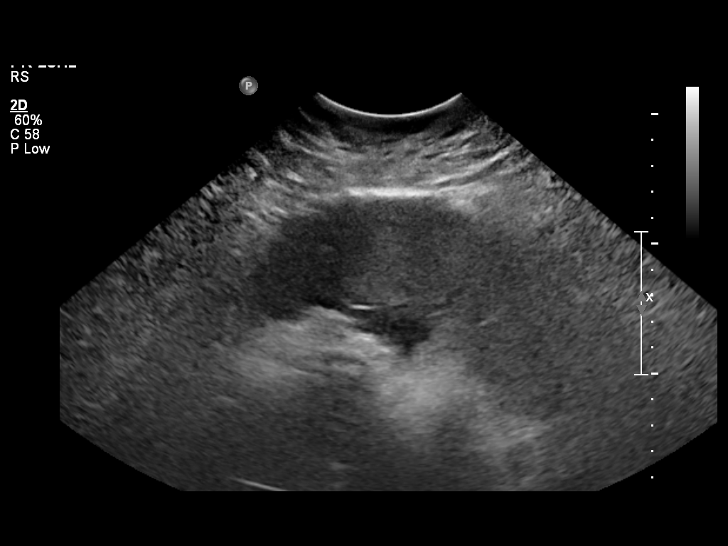
[im 14/31]
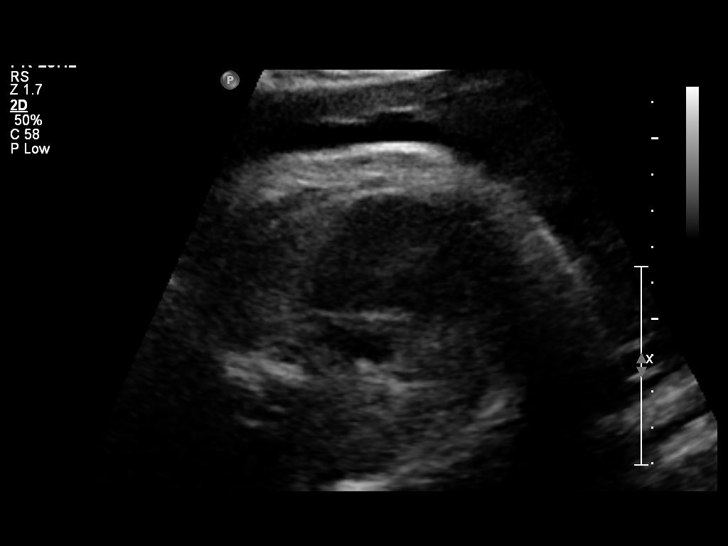
[im 17/31]
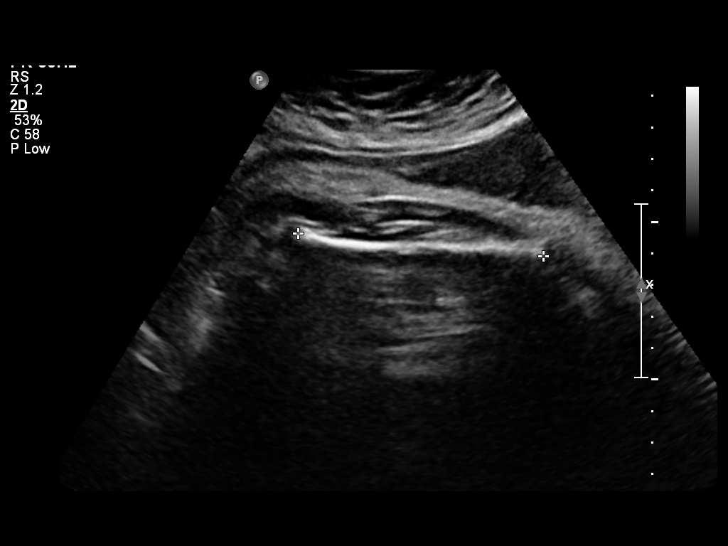
[im 19/31]
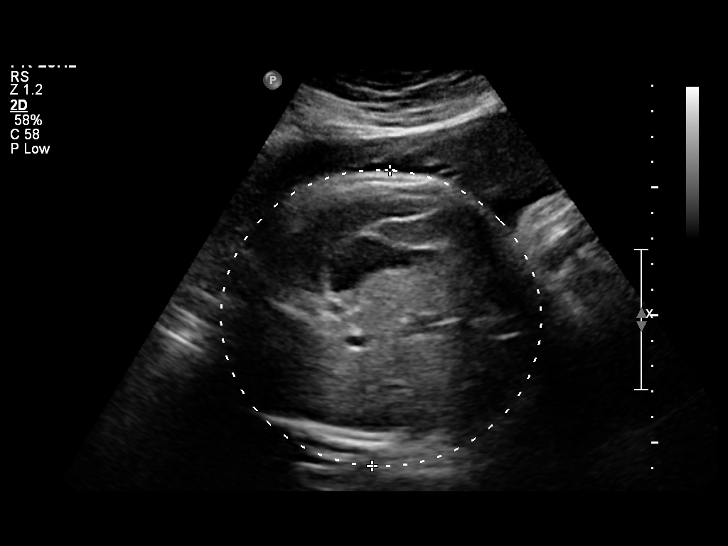
[im 22/31]
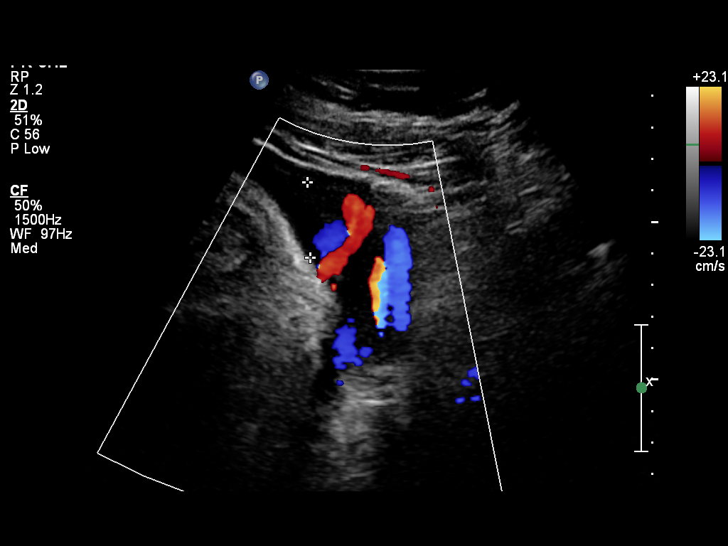
[im 25/31]
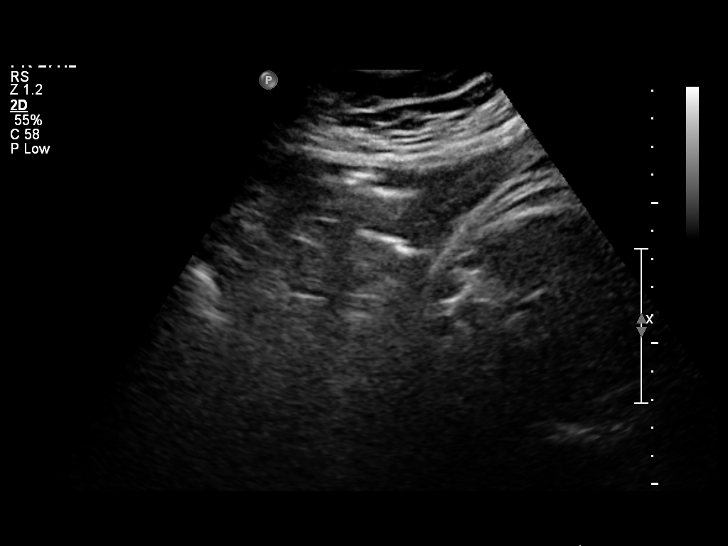
[im 27/31]
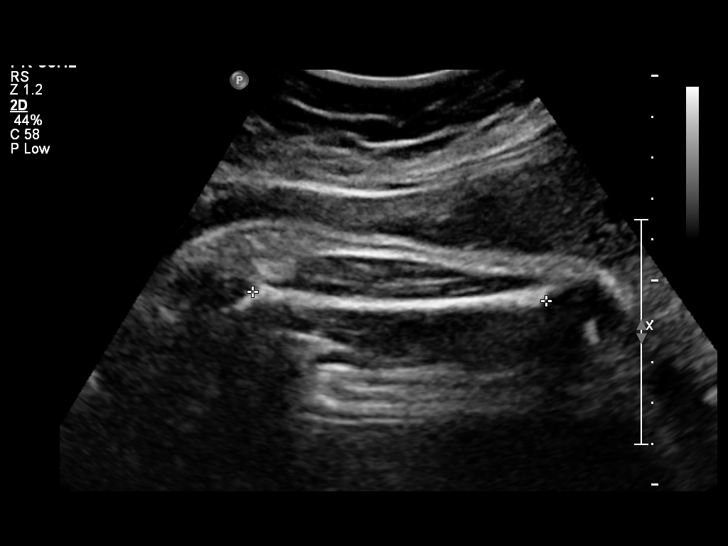
[im 29/31]
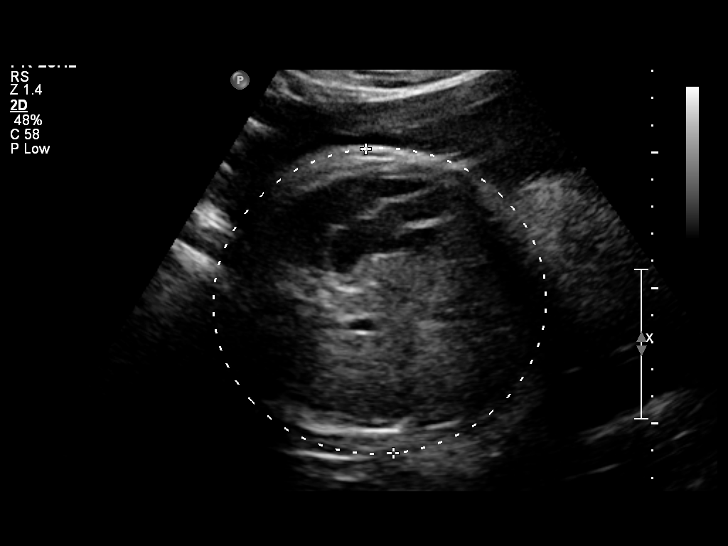

[12 of 28 positions shown; findings below may reference images not displayed]

OBSTETRICS REPORT
                      (Signed Final 12/06/2011 [DATE])

Procedures

 US OB FOLLOW UP                                       76816.1
Indications

 Obesity (327 lbs)
 Assess Fetal Growth / Estimated Fetal Weight
 Assess amniotic fluid volume
Fetal Evaluation

 Fetal Heart Rate:  163                         bpm
 Cardiac Activity:  Observed
 Presentation:      Cephalic
 Placenta:          Anterior, above cervical os
 P. Cord            Previously Visualized
 Insertion:

 Amniotic Fluid
 AFI FV:      Oligohydramnios
 AFI Sum:     6.02    cm      < 3  %Tile     Larg Pckt:   3.63   cm
 RLQ:   3.63   cm    LLQ:    2.39   cm
Biometry

 BPD:     92.8  mm    G. Age:   37w 5d                CI:        75.14   70 - 86
                                                      FL/HC:      21.5   20.6 -

 HC:     339.6  mm    G. Age:   39w 0d       42  %    HC/AC:      0.91   0.87 -

 AC:     373.2  mm    G. Age:   41w 2d     > 97  %    FL/BPD:     78.8   71 - 87
 FL:      73.1  mm    G. Age:   37w 3d       22  %    FL/AC:      19.6   20 - 24

 Est. FW:    9997  gm      8 lb 9 oz   > 90  %
Gestational Age

 LMP:           39w 6d       Date:   03/02/11                 EDD:   12/07/11
 U/S Today:     38w 6d                                        EDD:   12/14/11
 Best:          38w 6d    Det. By:   Early Ultrasound         EDD:   12/14/11
Anatomy
 Cranium:           Appears normal      Aortic Arch:       Not well
                                                           visualized
 Fetal Cavum:       Appears normal      Ductal Arch:       Not well
                                                           visualized
 Ventricles:        Appears normal      Diaphragm:         Previously seen
 Choroid Plexus:    Previously seen     Stomach:           Appears
                                                           normal, left
                                                           sided
 Cerebellum:        Previously seen     Abdomen:           Previously seen
 Posterior Fossa:   Previously seen     Abdominal Wall:    Not well
                                                           visualized
 Nuchal Fold:       Not applicable      Cord Vessels:      Previously seen
                    (>20 wks GA)
 Face:              Previously seen     Kidneys:           Appear normal
 Heart:             Previously seen     Bladder:           Appears normal
 RVOT:              Previously seen     Spine:             Not well
                                                           visualized
 LVOT:              Previously seen     Limbs:             Previously seen

 Other:     Male gender. Technically difficult due to  maternal
            habitus and advanced GA.
Cervix Uterus Adnexa

 Cervix:       Not visualized (advanced GA >34 wks)

 Adnexa:     No abnormality visualized.
Impression

 Single living intrauterine pregnancy in cephalic presentation.
 The estimated gestational age is 38w 6d based on Early
 Ultrasound. Estimated fetal weight is 3886g,  > 90th
 percentile for gestational age of   38w 6d. (AC is greater than
 other parameters.)
 Oligohydramnios, c/w ruptured membranes.

 questions or concerns.

## 2013-07-25 IMAGING — CT CT ANGIO CHEST
1 of 2 series · 19 of 32 positions shown · IV contrast (APPLIED)
Comparison: 12/12/2011 radiograph

CLINICAL DATA: Cough, shortness of breath

CT ANGIOGRAPHY CHEST
TECHNIQUE: Multidetector CT imaging of the chest using the
standard protocol during bolus administration of intravenous
contrast. Multiplanar reconstructed images including MIPs were
obtained and reviewed to evaluate the vascular anatomy.
Contrast: 100mL OMNIPAQUE IOHEXOL 300 MG/ML  SOLN

[Series 5: thins for pacs · axial · 0.79mm/px · z∈[-290,-77]mm · 19 of 235 slices shown]
[im 11/235  lung]
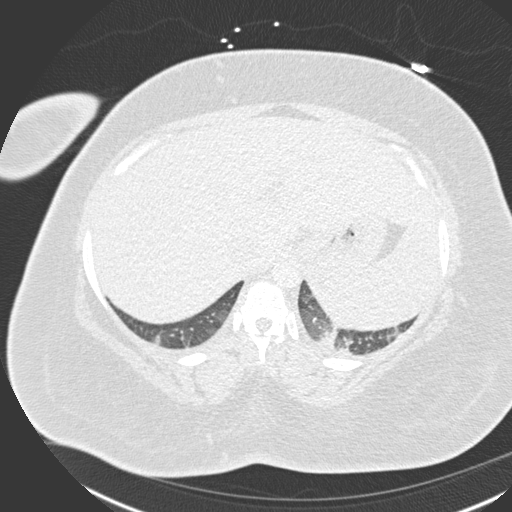
[im 21/235  soft-tissue]
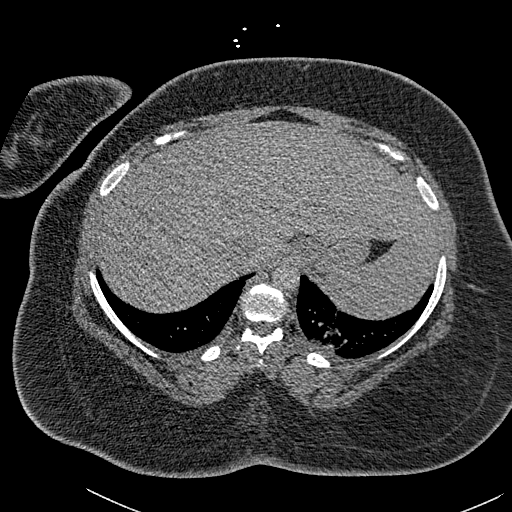
[im 31/235  lung]
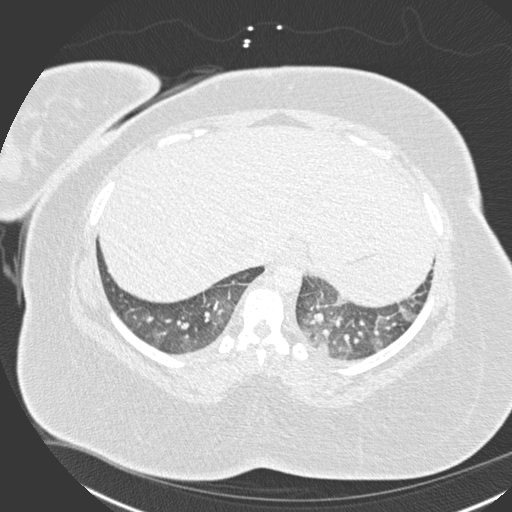
[im 51/235  soft-tissue]
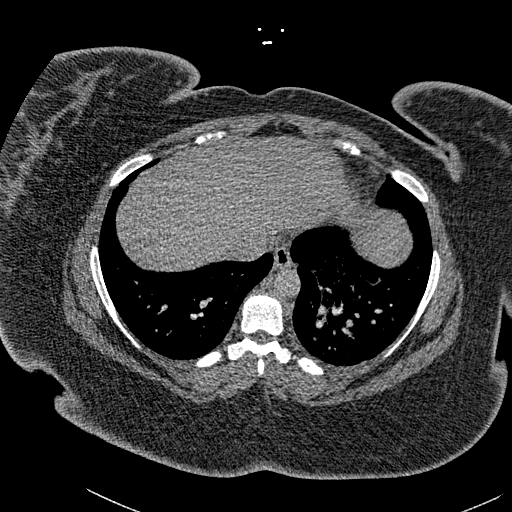
[im 62/235  lung]
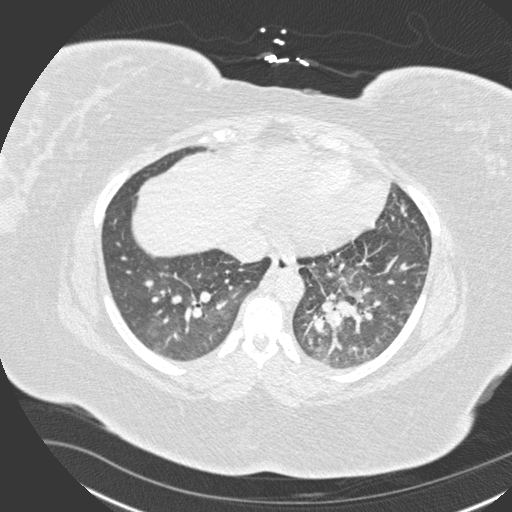
[im 72/235  soft-tissue]
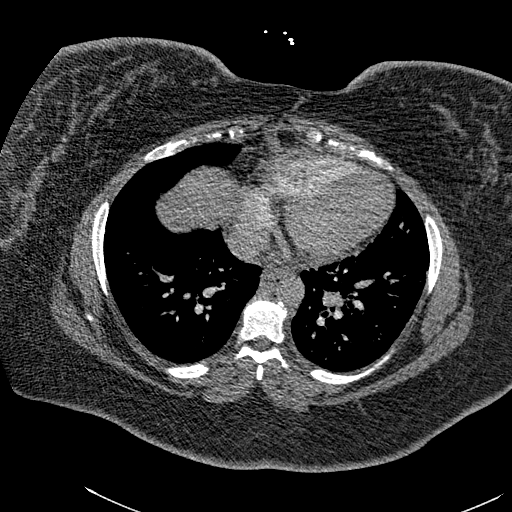
[im 82/235  lung]
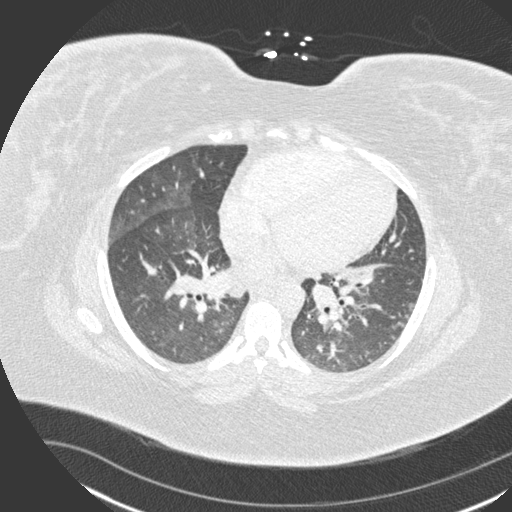
[im 92/235  soft-tissue]
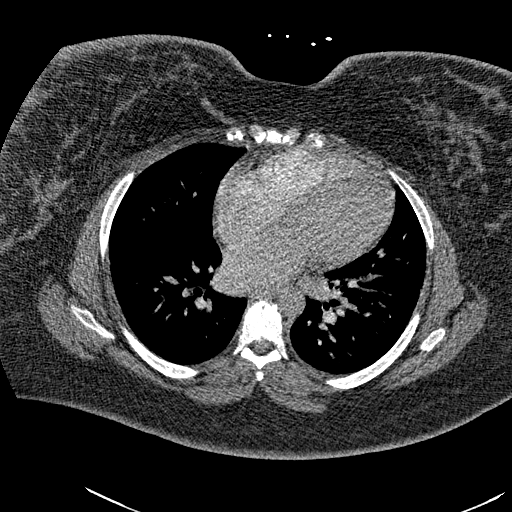
[im 102/235  lung]
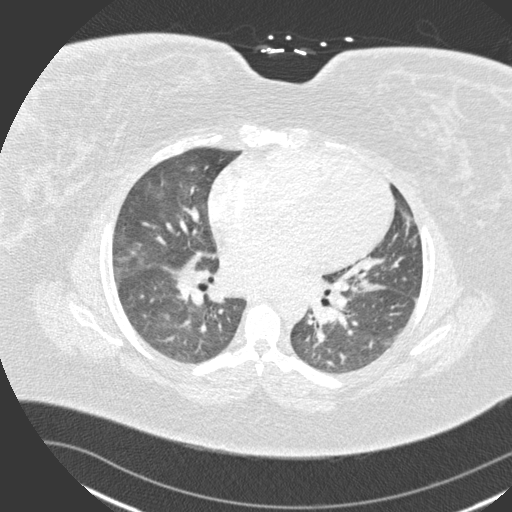
[im 123/235  soft-tissue]
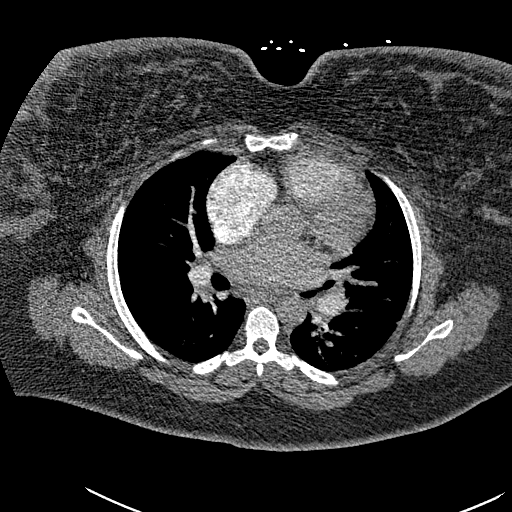
[im 133/235  lung]
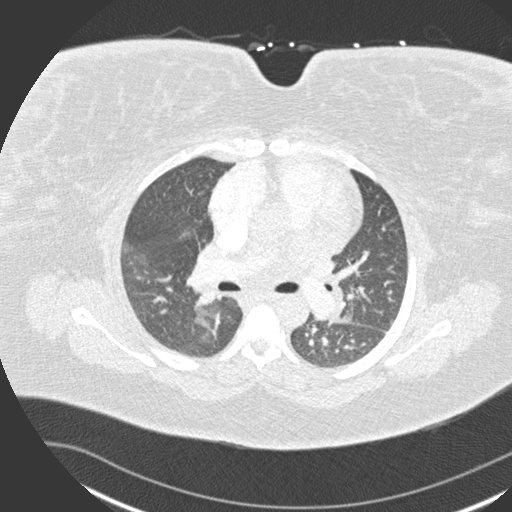
[im 143/235  soft-tissue]
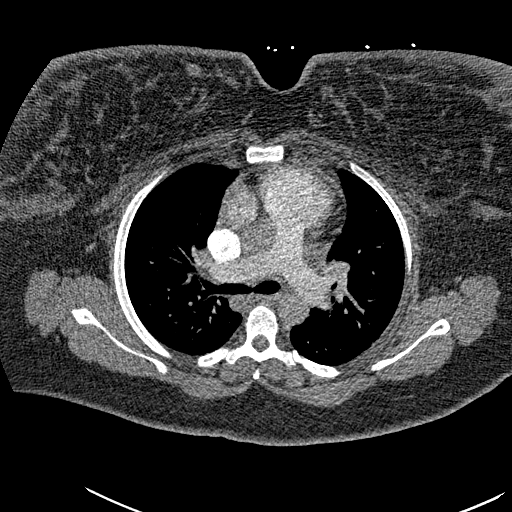
[im 153/235  lung]
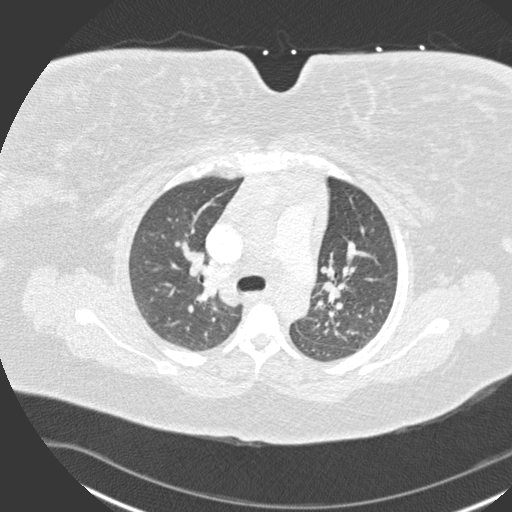
[im 163/235  soft-tissue]
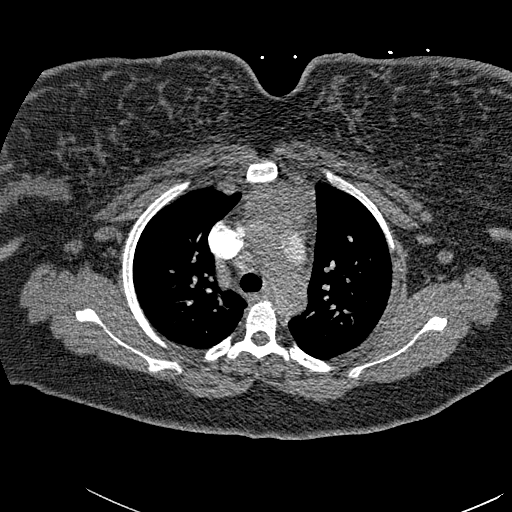
[im 173/235  lung]
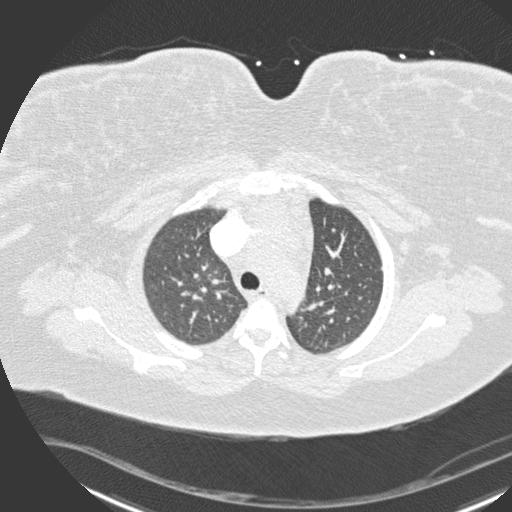
[im 184/235  soft-tissue]
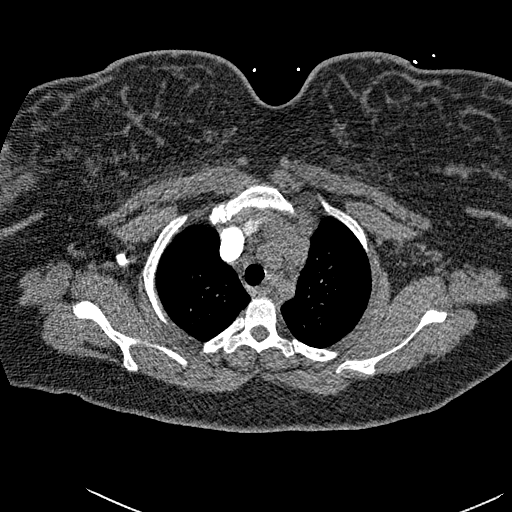
[im 204/235  lung]
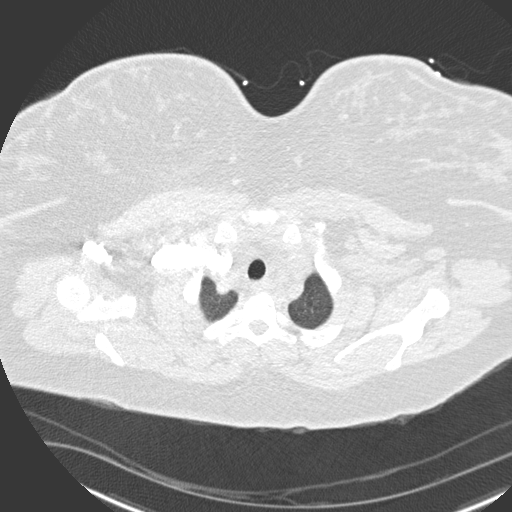
[im 214/235  soft-tissue]
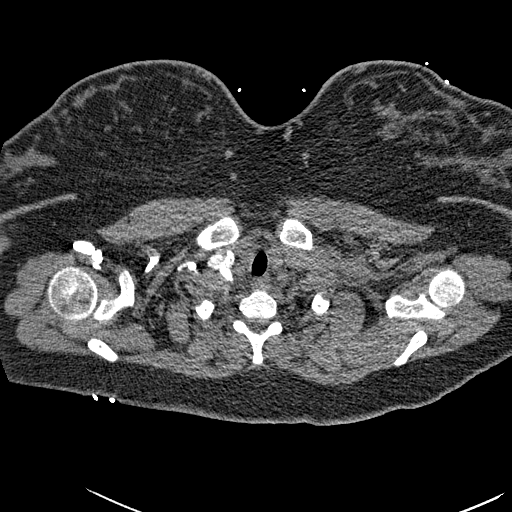
[im 224/235  lung]
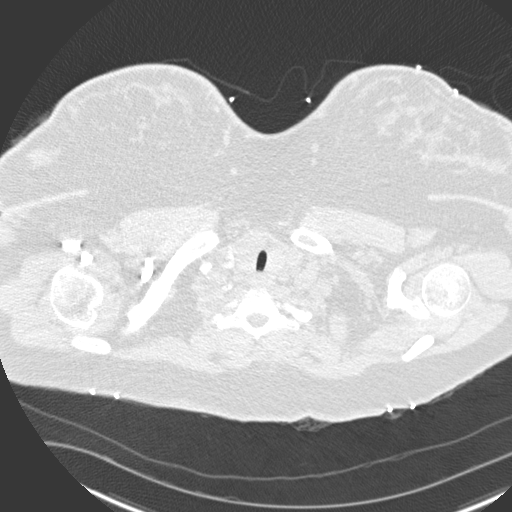

[19 of 32 positions shown; findings below may reference images not displayed]

FINDINGS: Suboptimal contrast bolus timing.  No main branch
pulmonary arterial filling defect.  Lobar and more peripheral
branches are not adequately opacified.  Cardiomegaly.  No pleural
or pericardial effusion.  Prominent anterior mediastinal soft
tissue.

Limited images through the upper abdomen demonstrate no acute
abnormality.

Degraded by respiratory motion.  Central airways are patent.  Mild
patchy opacity within the lower lobes, left greater than right, and
mild interlobular septal prominence particularly in the left lower
lobe. No pneumothorax.

No acute osseous finding.
IMPRESSION: No main branch pulmonary embolism.  Lobar and more peripheral
branches are poorly opacified, limiting evaluation.

Cardiomegaly. Patchy bibasilar opacities and interlobular septal
prominence; favored to reflect edema or atypical infection.

Prominent anterior mediastinal soft tissue may reflect residual
thymus.  Consider 6-month follow-up.

## 2014-05-06 ENCOUNTER — Encounter (HOSPITAL_COMMUNITY): Payer: Self-pay | Admitting: Emergency Medicine

## 2014-08-25 ENCOUNTER — Emergency Department (HOSPITAL_COMMUNITY): Payer: BLUE CROSS/BLUE SHIELD

## 2014-08-25 ENCOUNTER — Encounter (HOSPITAL_COMMUNITY): Payer: Self-pay | Admitting: Emergency Medicine

## 2014-08-25 ENCOUNTER — Emergency Department (HOSPITAL_COMMUNITY)
Admission: EM | Admit: 2014-08-25 | Discharge: 2014-08-25 | Disposition: A | Discharge status: Home / Self Care | Payer: BLUE CROSS/BLUE SHIELD | Attending: Emergency Medicine | Admitting: Emergency Medicine

## 2014-08-25 DIAGNOSIS — R197 Diarrhea, unspecified: Secondary | ICD-10-CM | POA: Insufficient documentation

## 2014-08-25 DIAGNOSIS — Z8719 Personal history of other diseases of the digestive system: Secondary | ICD-10-CM | POA: Insufficient documentation

## 2014-08-25 DIAGNOSIS — J45909 Unspecified asthma, uncomplicated: Secondary | ICD-10-CM | POA: Diagnosis not present

## 2014-08-25 DIAGNOSIS — R05 Cough: Secondary | ICD-10-CM | POA: Diagnosis not present

## 2014-08-25 DIAGNOSIS — R059 Cough, unspecified: Secondary | ICD-10-CM

## 2014-08-25 DIAGNOSIS — R1110 Vomiting, unspecified: Secondary | ICD-10-CM | POA: Insufficient documentation

## 2014-08-25 DIAGNOSIS — Z79899 Other long term (current) drug therapy: Secondary | ICD-10-CM | POA: Insufficient documentation

## 2014-08-25 MED ORDER — HYDROCOD POLST-CHLORPHEN POLST 10-8 MG/5ML PO LQCR: 5.0000 mL | Freq: Once | ORAL | Status: DC

## 2014-08-25 MED ORDER — DEXAMETHASONE 4 MG PO TABS
4.0000 mg | ORAL_TABLET | Freq: Once | ORAL | Status: AC
  Administered 2014-08-25: 4 mg via ORAL
  Filled 2014-08-25: qty 1

## 2014-08-25 MED ORDER — HYDROCOD POLST-CHLORPHEN POLST 10-8 MG/5ML PO LQCR
5.0000 mL | Freq: Once | ORAL | Status: AC
  Administered 2014-08-25: 5 mL via ORAL
  Filled 2014-08-25: qty 5

## 2014-08-25 MED ORDER — DEXAMETHASONE 4 MG PO TABS
12.0000 mg | ORAL_TABLET | Freq: Once | ORAL | Status: AC
  Administered 2014-08-25: 8 mg via ORAL
  Filled 2014-08-25: qty 3

## 2014-08-25 NOTE — ED Notes (Signed)
Patient verbalizes understanding of not driving for 4 hours after [place]. D/c with friend to transport home. In NAD at time of d/c.

## 2014-08-25 NOTE — Discharge Instructions (Signed)
Cough, Adult  A cough is a reflex that helps clear your throat and airways. It can help heal the body or may be a reaction to an irritated airway. A cough may only last 2 or 3 weeks (acute) or may last more than 8 weeks (chronic).  CAUSES Acute cough:  Viral or bacterial infections. Chronic cough:  Infections.  Allergies.  Asthma.  Post-nasal drip.  Smoking.  Heartburn or acid reflux.  Some medicines.  Chronic lung problems (COPD).  Cancer. SYMPTOMS   Cough.  Fever.  Chest pain.  Increased breathing rate.  High-pitched whistling sound when breathing (wheezing).  Colored mucus that you cough up (sputum). TREATMENT   A bacterial cough may be treated with antibiotic medicine.  A viral cough must run its course and will not respond to antibiotics.  Your caregiver may recommend other treatments if you have a chronic cough. HOME CARE INSTRUCTIONS   Only take over-the-counter or prescription medicines for pain, discomfort, or fever as directed by your caregiver. Use cough suppressants only as directed by your caregiver.  Use a cold steam vaporizer or humidifier in your bedroom or home to help loosen secretions.  Sleep in a semi-upright position if your cough is worse at night.  Rest as needed.  Stop smoking if you smoke. SEEK IMMEDIATE MEDICAL CARE IF:   You have pus in your sputum.  Your cough starts to worsen.  You cannot control your cough with suppressants and are losing sleep.  You begin coughing up blood.  You have difficulty breathing.  You develop pain which is getting worse or is uncontrolled with medicine.  You have a fever. MAKE SURE YOU:   Understand these instructions.  Will watch your condition.  Will get help right away if you are not doing well or get worse. Document Released: 12/18/2010 Document Revised: 09/13/2011 Document Reviewed: 12/18/2010 Exit[employer] Patient Information 2015 [employer]. This information is not intended  to replace advice given to you by your health care provider. Make sure you discuss any questions you have with your health care provider.  

## 2014-08-25 NOTE — ED Provider Notes (Signed)
CSN: [id number]     Arrival date & time 08/25/14  1952 History   First MD Initiated Contact with Patient 08/25/14 2008     Chief Complaint  Patient presents with  . Cough  . Diarrhea  . Emesis     (Consider location/radiation/quality/duration/timing/severity/associated sxs/prior Treatment) Patient is a 29 y.o. female presenting with cough, diarrhea, and vomiting. The history is provided by the patient.  Cough Cough characteristics:  Non-productive Severity:  Mild Onset quality:  Gradual Duration:  4 weeks Timing:  Sporadic Progression:  Unchanged Chronicity:  New Smoker: no   Context: upper respiratory infection   Context: not weather changes   Relieved by:  Nothing Worsened by:  Nothing tried Associated symptoms: no fever, no rash and no rhinorrhea   Diarrhea Associated symptoms: vomiting   Associated symptoms: no fever   Emesis Associated symptoms: diarrhea     Past Medical History  Diagnosis Date  . Asthma   . GERD (gastroesophageal reflux disease)    Past Surgical History  Procedure Laterality Date  . Cesarean section  12/06/2011    Procedure: CESAREAN SECTION;  Surgeon: Oliver Pila, MD;  Location: [facility];  Service: Gynecology;  Laterality: N/A;   Family History  Problem Relation Age of Onset  . Asthma Father    History  Substance Use Topics  . Smoking status: Never Smoker   . Smokeless tobacco: Never Used  . Alcohol Use: Yes   OB History    Gravida Para Term Preterm AB TAB SAB Ectopic Multiple Living   0 0 0 0 0 0 1     Review of Systems  Constitutional: Negative for fever.  HENT: Negative for rhinorrhea.   Respiratory: Positive for cough.   Gastrointestinal: Positive for vomiting and diarrhea.  Skin: Negative for rash.  All other systems reviewed and are negative.     Allergies  Review of patient's allergies indicates no known allergies.  Home Medications   Prior to Admission medications   Medication Sig Start Date End Date  Taking? Authorizing Provider  albuterol (PROVENTIL HFA;VENTOLIN HFA) 108 (90 BASE) MCG/ACT inhaler Inhale 2 puffs into the lungs every 6 (six) hours as needed for wheezing or shortness of breath (shortness of breath). For shortness of breath.   Yes Historical Provider, MD  Chlorpheniramine-DM (COUGH & COLD PO) Take 1 tablet by mouth every 6 (six) hours as needed (cough).   Yes Historical Provider, MD  diphenhydrAMINE (BENADRYL) 25 MG tablet Take 50-75 mg by mouth every 6 (six) hours as needed for allergies (allergies).   Yes Historical Provider, MD  ibuprofen (ADVIL,MOTRIN) 200 MG tablet Take 400 mg by mouth every 6 (six) hours as needed for moderate pain (pain).    Yes Historical Provider, MD  acetaminophen (TYLENOL) 325 MG tablet Take 650 mg by mouth every 6 (six) hours as needed for pain.    Historical Provider, MD  cefdinir (OMNICEF) 300 MG capsule Take 1 capsule (300 mg total) by mouth 2 (two) times daily. Patient not taking: Reported on 08/25/2014 07/07/13   Hayden Rasmussen, NP  naproxen (NAPROSYN) 500 MG tablet Take 1 tablet (500 mg total) by mouth 2 (two) times daily. Patient not taking: Reported on 08/25/2014 04/30/13   Phill Mutter Dammen, PA-C   BP 143/80 mmHg  Pulse 120  Temp(Src) 98.4 F (36.9 C) (Oral)  Resp 18  Ht  (1.6 m)  Wt 340 lb (154.223 kg)  BMI 60.24 kg/m2  SpO2 100% Physical Exam  Constitutional:  She is oriented to person, place, and time. She appears well-developed and well-nourished. No distress.  HENT:  Head: Normocephalic and atraumatic.  Right Ear: Tympanic membrane normal.  Left Ear: Tympanic membrane normal.  Mouth/Throat: Posterior oropharyngeal erythema (mild, L superior tonsillar bed) present. No oropharyngeal exudate or posterior oropharyngeal edema.  Eyes: EOM are normal. Pupils are equal, round, and reactive to light.  Neck: Normal range of motion. Neck supple.  Cardiovascular: Normal rate and regular rhythm.  Exam reveals no friction rub.   No murmur  heard. Pulmonary/Chest: Effort normal and breath sounds normal. No respiratory distress. She has no wheezes. She has no rales.  Abdominal: Soft. She exhibits no distension. There is no tenderness. There is no rebound.  Musculoskeletal: Normal range of motion. She exhibits no edema.  Neurological: She is alert and oriented to person, place, and time.  Skin: No rash noted. She is not diaphoretic.  Nursing note and vitals reviewed.   ED Course  Procedures (including critical care time) Labs Review Labs Reviewed - No data to display  Imaging Review Dg Chest 2 View  08/25/2014   CLINICAL DATA:  Cough, shortness of breath, right-sided chest pain for 1 month. History of asthma and GERD.  EXAM: CHEST  2 VIEW  COMPARISON:  12/17/2011  FINDINGS: The heart size and mediastinal contours are within normal limits. Both lungs are clear. The visualized skeletal structures are unremarkable.  IMPRESSION: No active cardiopulmonary disease.   Electronically Signed   By: Burman NievesWilliam  Stevens M.D.   On: 08/25/2014 21:56     EKG Interpretation None      MDM   Final diagnoses:  Cough    29 year old female here with cough for the past month, mildly productive of clear sputum. No fever. Occasional posttussive emesis but no outright frank emesis. Mild diarrhea today. Mild aching in her back. Here relaxing comfortably, no S4 distress. Lungs are clear. Belly is benign. Mild posterior pharyngeal erythema without swelling. Likely URI. We'll check chest x-ray. Decadron and albuterol given as she has a hx of asthma. CXR ok. Stable for discharge.  Elwin MochaBlair Oral Hallgren, MD 08/25/14 [phone]

## 2014-08-25 NOTE — ED Notes (Signed)
Pt reports cough for the past month, coughing up clear phlegm. Over the past week has been having emesis and diarrhea, last episode last night. Generalized body aches.

## 2014-08-28 NOTE — Progress Notes (Signed)
ED CM received call from [facility] for clarification of prescription, ED CM provided clarifications. No further ED CM needs identified.

## 2014-09-23 ENCOUNTER — Ambulatory Visit (INDEPENDENT_AMBULATORY_CARE_PROVIDER_SITE_OTHER): Payer: BLUE CROSS/BLUE SHIELD | Admitting: Physician Assistant

## 2014-09-23 VITALS — BP 126/86 | HR 103 | Temp 98.9°F | Resp 20 | Ht 64.0 in | Wt 351.0 lb

## 2014-09-23 DIAGNOSIS — J029 Acute pharyngitis, unspecified: Secondary | ICD-10-CM | POA: Diagnosis not present

## 2014-09-23 DIAGNOSIS — J028 Acute pharyngitis due to other specified organisms: Principal | ICD-10-CM

## 2014-09-23 DIAGNOSIS — J039 Acute tonsillitis, unspecified: Secondary | ICD-10-CM

## 2014-09-23 DIAGNOSIS — B9789 Other viral agents as the cause of diseases classified elsewhere: Secondary | ICD-10-CM

## 2014-09-23 LAB — POCT RAPID STREP A (OFFICE): RAPID STREP A SCREEN: NEGATIVE

## 2014-09-23 MED ORDER — AMOXICILLIN 875 MG PO TABS: 875.0000 mg | ORAL_TABLET | Freq: Two times a day (BID) | ORAL | Status: DC

## 2014-09-23 NOTE — Progress Notes (Signed)
Subjective:    Patient ID: Jill Odom, female    DOB: 04/26/1986, 29 y.o.   MRN: [id number]  HPI Patient presents for sore throat that has been present for past 3 days. Pain is keeping patient up at night. Additionally endorses headache, nausea, ear pain, post nasal drip, congestion, and some cough. Denies fever, sinus pressure, rhinorrhea, trouble swallowing, loss of voice, SOB/CP, or wheezing. H/o asthma and allergies. Has tried salt water gargle, tussionex, ibuprofen, and drinking pineapple juice for relief. No known sick contacts. NKDA.  Review of Systems  Constitutional: Negative for fever, chills and fatigue.  HENT: Positive for congestion, ear pain, postnasal drip and sore throat. Negative for ear discharge, rhinorrhea, sinus pressure, sneezing, trouble swallowing and voice change.   Respiratory: Positive for cough. Negative for shortness of breath and wheezing.   Cardiovascular: Negative for chest pain.  Gastrointestinal: Positive for nausea. Negative for vomiting.  Musculoskeletal: Positive for neck pain. Negative for neck stiffness.  Neurological: Positive for headaches. Negative for dizziness.  Hematological: Positive for adenopathy.       Objective:   Physical Exam  Constitutional: She is oriented to person, place, and time. She appears well-developed and well-nourished. No distress.  Blood pressure 126/86, pulse 103, temperature 98.9 F (37.2 C), resp. rate 20, height 5\' 4"  (1.626 m), weight 351 lb (159.213 kg), last menstrual period 09/16/2014, SpO2 98 %.  HENT:  Head: Normocephalic and atraumatic.  Right Ear: Tympanic membrane, external ear and ear canal normal.  Left Ear: Tympanic membrane, external ear and ear canal normal.  Nose: Rhinorrhea present. No mucosal edema. Right sinus exhibits no maxillary sinus tenderness and no frontal sinus tenderness. Left sinus exhibits no maxillary sinus tenderness and no frontal sinus tenderness.  Mouth/Throat: Uvula is midline  and mucous membranes are normal. Oropharyngeal exudate and posterior oropharyngeal erythema present. No posterior oropharyngeal edema or tonsillar abscesses.  Left tonsil 4+ hypertrophic with erythema and exudate.  Right tonsil 3+ hypertrophic with erythema and no exudate.  Eyes: Conjunctivae are normal. Pupils are equal, round, and reactive to light. Right eye exhibits no discharge. Left eye exhibits no discharge. No scleral icterus.  Neck: Trachea normal and normal range of motion. Neck supple. No thyromegaly present.  Cardiovascular: Normal rate, regular rhythm and normal heart sounds.  Exam reveals no gallop and no friction rub.   No murmur heard. Pulmonary/Chest: Effort normal and breath sounds normal. No respiratory distress. She has no decreased breath sounds. She has no wheezes. She has no rhonchi. She has no rales.  Abdominal: Soft. Bowel sounds are normal. There is no tenderness. There is no rebound and no guarding.  Lymphadenopathy:    She has cervical adenopathy.  Neurological: She is alert and oriented to person, place, and time.  Skin: Skin is warm and dry. No rash noted. She is not diaphoretic. No erythema. No pallor.   Results for orders placed or performed in visit on 09/23/14  POCT rapid strep A  Result Value Ref Range   Rapid Strep A Screen Negative Negative      Assessment & Plan:  1. Sore throat (viral) - POCT rapid strep A - Culture, Group A Strep  2. Acute tonsillitis Continue ibuprofen for any pain and fever. Salt water gargles.  - amoxicillin (AMOXIL) 875 MG tablet; Take 1 tablet (875 mg total) by mouth 2 (two) times daily.  Dispense: 20 tablet; Refill: 0   Ora Mcnatt PA-C  [facility] 09/23/2014  5:49 PM

## 2014-09-25 ENCOUNTER — Encounter: Payer: Self-pay | Admitting: Physician Assistant

## 2014-09-25 LAB — CULTURE, GROUP A STREP: Organism ID, Bacteria: NORMAL

## 2014-10-24 ENCOUNTER — Encounter (HOSPITAL_COMMUNITY): Payer: Self-pay | Admitting: Family Medicine

## 2014-10-24 ENCOUNTER — Emergency Department (INDEPENDENT_AMBULATORY_CARE_PROVIDER_SITE_OTHER)
Admission: EM | Admit: 2014-10-24 | Discharge: 2014-10-24 | Disposition: A | Discharge status: Home / Self Care | Payer: BLUE CROSS/BLUE SHIELD | Source: Home / Self Care | Attending: Family Medicine | Admitting: Family Medicine

## 2014-10-24 DIAGNOSIS — J069 Acute upper respiratory infection, unspecified: Secondary | ICD-10-CM | POA: Diagnosis not present

## 2014-10-24 DIAGNOSIS — B9789 Other viral agents as the cause of diseases classified elsewhere: Principal | ICD-10-CM

## 2014-10-24 MED ORDER — IPRATROPIUM BROMIDE 0.06 % NA SOLN: 2.0000 | Freq: Four times a day (QID) | NASAL | Status: DC

## 2014-10-24 MED ORDER — BENZONATATE 100 MG PO CAPS: 100.0000 mg | ORAL_CAPSULE | Freq: Three times a day (TID) | ORAL | Status: DC | PRN

## 2014-10-24 MED ORDER — FLUTICASONE PROPIONATE 50 MCG/ACT NA SUSP
2.0000 | Freq: Every day | NASAL | Status: DC
Start: 2014-10-24 — End: 2016-11-03

## 2014-10-24 MED ORDER — PREDNISONE 20 MG PO TABS
ORAL_TABLET | ORAL | Status: AC
  Filled 2014-10-24: qty 3

## 2014-10-24 MED ORDER — PREDNISONE 20 MG PO TABS
60.0000 mg | ORAL_TABLET | Freq: Once | ORAL | Status: AC
  Administered 2014-10-24: 60 mg via ORAL

## 2014-10-24 NOTE — ED Provider Notes (Signed)
CSN: [id number]     Arrival date & time 10/24/14  1516 History   First MD Initiated Contact with Patient 10/24/14 1549     Chief Complaint  Patient presents with  . URI   (Consider location/radiation/quality/duration/timing/severity/associated sxs/prior Treatment) HPI  3 days ago developed cough. Cough medicine w/ codeine for sleep. Associated w/ posttussive emesis, nasal congestion, runny nose. Nothing makes pts symptoms worse. Overall getting worse. Chills. Subjective fevers. Symptoms are intermittent.  Denies Abd pain, HA, CP, palpitations, dysuria, frequency, back pain, syncope, SOB  Past Medical History  Diagnosis Date  . Asthma   . GERD (gastroesophageal reflux disease)   . Allergy    Past Surgical History  Procedure Laterality Date  . Cesarean section  12/06/2011    Procedure: CESAREAN SECTION;  Surgeon: Oliver PilaKathy W Richardson, MD;  Location: [facility];  Service: Gynecology;  Laterality: N/A;   Family History  Problem Relation Age of Onset  . Asthma Father    History  Substance Use Topics  . Smoking status: Never Smoker   . Smokeless tobacco: Never Used  . Alcohol Use: Yes   OB History    Gravida Para Term Preterm AB TAB SAB Ectopic Multiple Living   1 1 1  0 0 0 0 0 0 1     Review of Systems Per HPI with all other pertinent systems negative.   Allergies  Review of patient's allergies indicates no known allergies.  Home Medications   Prior to Admission medications   Medication Sig Start Date End Date Taking? Authorizing Provider  albuterol (PROVENTIL HFA;VENTOLIN HFA) 108 (90 BASE) MCG/ACT inhaler Inhale 2 puffs into the lungs every 6 (six) hours as needed for wheezing or shortness of breath (shortness of breath). For shortness of breath.    Historical Provider, MD  benzonatate (TESSALON PERLES) 100 MG capsule Take 1-2 capsules (100-200 mg total) by mouth 3 (three) times daily as needed for cough. 10/24/14   Ozella Rocksavid J Merrell, MD  fluticasone (FLONASE) 50 MCG/ACT nasal  spray Place 2 sprays into both nostrils at bedtime. 10/24/14   Ozella Rocksavid J Merrell, MD  ipratropium (ATROVENT) 0.06 % nasal spray Place 2 sprays into both nostrils 4 (four) times daily. 10/24/14   Ozella Rocksavid J Merrell, MD   BP 115/69 mmHg  Pulse 96  Temp(Src) 98.9 F (37.2 C) (Oral)  Resp 20  SpO2 100% Physical Exam Physical Exam  Constitutional: oriented to person, place, and time. appears well-developed and well-nourished. No distress.  HENT:  Tonsils 2+ bilaterally with minimal exudate. Copious nasal discharge Head: Normocephalic and atraumatic.  Eyes: EOMI. PERRL.  Neck: Normal range of motion.  Cardiovascular: RRR, no m/r/g, 2+ distal pulses,  Pulmonary/Chest: Effort normal and breath sounds normal. No respiratory distress.  Abdominal: Soft. Bowel sounds are normal. NonTTP, no distension.  Musculoskeletal: Normal range of motion. Non ttp, no effusion.  Neurological: alert and oriented to person, place, and time.  Skin: Skin is warm. No rash noted. non diaphoretic.  Psychiatric: normal mood and affect. behavior is normal. Judgment and thought content normal.    ED Course  Procedures (including critical care time) Labs Review Labs Reviewed - No data to display  Imaging Review No results found.   MDM   1. Viral URI with cough    Start nasal Atrovent, Flonase, ibuprofen, allergy pill such as Zyrtec or Allegra,  Prednisone 60 mg given in office for immediate relief as tonsils are very enlarged.   Ozella Rocksavid J Merrell, MD 10/24/14 [phone]

## 2014-10-24 NOTE — Discharge Instructions (Signed)
You likely are suffering from a viral syndrome that is compounded by some allergies. Please start the nasal Atrovent for runny nose and nasal congestion. Please use the Flonase at night to help with nasal inflammation. Please start ibuprofen 600 mg every 6 hours. Please consider also starting a daily allergy pill such as Zyrtec or Allegra. Please use your albuterol inhaler as needed every 4 hours as this may help with your cough and symptoms as well.

## 2014-10-24 NOTE — ED Notes (Signed)
Patient c/o cold sx including dry cough, hot and cold chills, and sob x 2 days. Patient is in NAD.

## 2015-05-30 ENCOUNTER — Emergency Department (HOSPITAL_COMMUNITY): Payer: Medicaid Other

## 2015-05-30 ENCOUNTER — Emergency Department (HOSPITAL_COMMUNITY)
Admission: EM | Admit: 2015-05-30 | Discharge: 2015-05-30 | Disposition: A | Discharge status: Home / Self Care | Payer: Medicaid Other | Attending: Emergency Medicine | Admitting: Emergency Medicine

## 2015-05-30 ENCOUNTER — Encounter (HOSPITAL_COMMUNITY): Payer: Self-pay | Admitting: *Deleted

## 2015-05-30 DIAGNOSIS — Z8719 Personal history of other diseases of the digestive system: Secondary | ICD-10-CM | POA: Insufficient documentation

## 2015-05-30 DIAGNOSIS — Y9289 Other specified places as the place of occurrence of the external cause: Secondary | ICD-10-CM | POA: Diagnosis not present

## 2015-05-30 DIAGNOSIS — S8992XA Unspecified injury of left lower leg, initial encounter: Secondary | ICD-10-CM

## 2015-05-30 DIAGNOSIS — S80811A Abrasion, right lower leg, initial encounter: Secondary | ICD-10-CM | POA: Diagnosis not present

## 2015-05-30 DIAGNOSIS — Y998 Other external cause status: Secondary | ICD-10-CM | POA: Diagnosis not present

## 2015-05-30 DIAGNOSIS — Z23 Encounter for immunization: Secondary | ICD-10-CM | POA: Insufficient documentation

## 2015-05-30 DIAGNOSIS — J45909 Unspecified asthma, uncomplicated: Secondary | ICD-10-CM | POA: Insufficient documentation

## 2015-05-30 DIAGNOSIS — S80212A Abrasion, left knee, initial encounter: Secondary | ICD-10-CM | POA: Insufficient documentation

## 2015-05-30 DIAGNOSIS — S8991XA Unspecified injury of right lower leg, initial encounter: Secondary | ICD-10-CM

## 2015-05-30 DIAGNOSIS — T07XXXA Unspecified multiple injuries, initial encounter: Secondary | ICD-10-CM

## 2015-05-30 DIAGNOSIS — R52 Pain, unspecified: Secondary | ICD-10-CM

## 2015-05-30 DIAGNOSIS — Y9339 Activity, other involving climbing, rappelling and jumping off: Secondary | ICD-10-CM | POA: Diagnosis not present

## 2015-05-30 DIAGNOSIS — Z79899 Other long term (current) drug therapy: Secondary | ICD-10-CM | POA: Insufficient documentation

## 2015-05-30 MED ORDER — IBUPROFEN 400 MG PO TABS
800.0000 mg | ORAL_TABLET | Freq: Once | ORAL | Status: AC
  Administered 2015-05-30: 800 mg via ORAL
  Filled 2015-05-30: qty 2

## 2015-05-30 MED ORDER — METHOCARBAMOL 500 MG PO TABS: 500.0000 mg | ORAL_TABLET | Freq: Two times a day (BID) | ORAL | Status: DC

## 2015-05-30 MED ORDER — IBUPROFEN 800 MG PO TABS: 800.0000 mg | ORAL_TABLET | Freq: Three times a day (TID) | ORAL | Status: DC

## 2015-05-30 MED ORDER — TETANUS-DIPHTH-ACELL PERTUSSIS 5-2.5-18.5 LF-MCG/0.5 IM SUSP
0.5000 mL | Freq: Once | INTRAMUSCULAR | Status: AC
  Administered 2015-05-30: 0.5 mL via INTRAMUSCULAR
  Filled 2015-05-30: qty 0.5
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
## 2015-05-30 NOTE — Discharge Instructions (Signed)
Elastic Bandage and RICE °WHAT DOES AN ELASTIC BANDAGE DO? °Elastic bandages come in different shapes and sizes. They generally provide support to your injury and reduce swelling while you are healing, but they can perform different functions. Your health care provider will help you to decide what is best for your protection, recovery, or rehabilitation following an injury. °WHAT ARE SOME GENERAL TIPS FOR USING AN ELASTIC BANDAGE? °· Use the bandage as directed by the maker of the bandage that you are using. °· Do not wrap the bandage too tightly. This may cut off the circulation in the arm or leg in the area below the bandage. °¨ If part of your body beyond the bandage becomes blue, numb, cold, swollen, or is more painful, your bandage is most likely too tight. If this occurs, remove your bandage and reapply it more loosely. °· See your health care provider if the bandage seems to be making your problems worse rather than better. °· An elastic bandage should be removed and reapplied every 3-4 hours or as directed by your health care provider. °WHAT IS RICE? °The routine care of many injuries includes rest, ice, compression, and elevation (RICE therapy).  °Rest °Rest is required to allow your body to heal. Generally, you can resume your routine activities when you are comfortable and have been given permission by your health care provider. °Ice °Icing your injury helps to keep the swelling down and it reduces pain. Do not apply ice directly to your skin. °· Put ice in a plastic bag. °· Place a towel between your skin and the bag. °· Leave the ice on for 20 minutes, 2-3 times per day. °Do this for as long as you are directed by your health care provider. °Compression °Compression helps to keep swelling down, gives support, and helps with discomfort. Compression may be done with an elastic bandage. °Elevation °Elevation helps to reduce swelling and it decreases pain. If possible, your injured area should be placed at  or above the level of your heart or the center of your chest. °WHEN SHOULD I SEEK MEDICAL CARE? °You should seek medical care if: °· You have persistent pain and swelling. °· Your symptoms are getting worse rather than improving. °These symptoms may indicate that further evaluation or further X-rays are needed. Sometimes, X-rays may not show a small broken bone (fracture) until a number of days later. Make a follow-up appointment with your health care provider. Ask when your X-ray results will be ready. Make sure that you get your X-ray results. °WHEN SHOULD I SEEK IMMEDIATE MEDICAL CARE? °You should seek immediate medical care if: °· You have a sudden onset of severe pain at or below the area of your injury. °· You develop redness or increased swelling around your injury. °· You have tingling or numbness at or below the area of your injury that does not improve after you remove the elastic bandage. °  °This information is not intended to replace advice given to you by your health care provider. Make sure you discuss any questions you have with your health care provider. °  °Document Released: 12/11/2001 Document Revised: 03/12/2015 Document Reviewed: 02/04/2014 °Elsevier Interactive Patient Education ©2016 [employer] ° °

## 2016-03-29 ENCOUNTER — Emergency Department (HOSPITAL_COMMUNITY)
Admission: EM | Admit: 2016-03-29 | Discharge: 2016-03-29 | Disposition: A | Discharge status: Home / Self Care | Payer: BLUE CROSS/BLUE SHIELD | Attending: Emergency Medicine | Admitting: Emergency Medicine

## 2016-03-29 ENCOUNTER — Encounter (HOSPITAL_COMMUNITY): Payer: Self-pay

## 2016-03-29 ENCOUNTER — Emergency Department (HOSPITAL_COMMUNITY): Payer: BLUE CROSS/BLUE SHIELD

## 2016-03-29 DIAGNOSIS — Z7951 Long term (current) use of inhaled steroids: Secondary | ICD-10-CM | POA: Insufficient documentation

## 2016-03-29 DIAGNOSIS — Z79899 Other long term (current) drug therapy: Secondary | ICD-10-CM | POA: Insufficient documentation

## 2016-03-29 DIAGNOSIS — J45909 Unspecified asthma, uncomplicated: Secondary | ICD-10-CM | POA: Insufficient documentation

## 2016-03-29 DIAGNOSIS — R079 Chest pain, unspecified: Secondary | ICD-10-CM | POA: Insufficient documentation

## 2016-03-29 LAB — BASIC METABOLIC PANEL
Anion gap: 6 (ref 5–15)
BUN: 8 mg/dL (ref 6–20)
CALCIUM: 10.3 mg/dL (ref 8.9–10.3)
CO2: 24 mmol/L (ref 22–32)
CREATININE: 0.75 mg/dL (ref 0.44–1.00)
Chloride: 108 mmol/L (ref 101–111)
GFR calc non Af Amer: >60 mL/min (ref >60)
Glucose, Bld: 86 mg/dL (ref 65–99)
Potassium: 4.5 mmol/L (ref 3.5–5.1)
SODIUM: 138 mmol/L (ref 135–145)

## 2016-03-29 LAB — CBC
HCT: 35.8 % — ABNORMAL LOW (ref 36.0–46.0)
Hemoglobin: 11 g/dL — ABNORMAL LOW (ref 12.0–15.0)
MCH: 26.6 pg (ref 26.0–34.0)
MCHC: 30.7 g/dL (ref 30.0–36.0)
MCV: 86.7 fL (ref 78.0–100.0)
PLATELETS: 378 10*3/uL (ref 150–400)
RBC: 4.13 MIL/uL (ref 3.87–5.11)
RDW: 15.4 % (ref 11.5–15.5)
WBC: 8.1 10*3/uL (ref 4.0–10.5)

## 2016-03-29 LAB — D-DIMER, QUANTITATIVE: D-Dimer, Quant: 0.44 ug/mL-FEU (ref 0.00–0.50)

## 2016-03-29 LAB — I-STAT TROPONIN, ED: TROPONIN I, POC: 0.02 ng/mL (ref 0.00–0.08)

## 2016-03-29 MED ORDER — PREDNISONE 50 MG PO TABS: ORAL_TABLET | ORAL | 0 refills | Status: DC

## 2016-03-29 MED ORDER — PREDNISONE 20 MG PO TABS
60.0000 mg | ORAL_TABLET | Freq: Once | ORAL | Status: AC
  Administered 2016-03-29: 60 mg via ORAL
  Filled 2016-03-29: qty 3

## 2016-03-29 MED ORDER — IPRATROPIUM BROMIDE 0.02 % IN SOLN
0.5000 mg | Freq: Once | RESPIRATORY_TRACT | Status: AC
  Administered 2016-03-29: 0.5 mg via RESPIRATORY_TRACT
  Filled 2016-03-29: qty 2.5

## 2016-03-29 MED ORDER — ALBUTEROL SULFATE (2.5 MG/3ML) 0.083% IN NEBU
5.0000 mg | INHALATION_SOLUTION | Freq: Once | RESPIRATORY_TRACT | Status: AC
  Administered 2016-03-29: 5 mg via RESPIRATORY_TRACT
  Filled 2016-03-29: qty 6

## 2016-03-29 MED ORDER — ALBUTEROL SULFATE HFA 108 (90 BASE) MCG/ACT IN AERS
2.0000 | INHALATION_SPRAY | RESPIRATORY_TRACT | Status: DC
  Administered 2016-03-29: 2 via RESPIRATORY_TRACT
  Filled 2016-03-29: qty 6.7

## 2016-03-29 NOTE — ED Notes (Signed)
Patient verbalizes understanding of discharge instructions, prescriptions, home care and follow up care. Patient out of department at this time with family. 

## 2016-03-29 NOTE — ED Triage Notes (Signed)
Pt with cough/chest pain and shortness of breath since Friday.  Unknown for fever.  Denies reflux.  Denies injury from heavy lifting.  Coughing in triage.

## 2016-03-29 NOTE — ED Provider Notes (Signed)
[facility]-EMERGENCY DEPT Provider Note   CSN: [id number] Arrival date & time: 03/29/16  1617     History   Chief Complaint Chief Complaint  Patient presents with  . Chest Pain  . Cough    HPI Jill Odom is a 30 y.o. female.  30 year old morbidly obese female presents with increased shortness of breath times several days along with right-sided sharp chest pain lasting for seconds.  No leg pain or swelling. Does note dyspnea on exertion but denies any orthopnea. No prior history of same. No recent travel history. No recent history of blood loss. Does have a history of asthma as well as seasonal allergies. Patient has had a cough times several days. Denies any sore throat or ear pain. No anginal quality to her current symptoms. No prior history of CHF      Past Medical History:  Diagnosis Date  . Allergy   . Asthma   . GERD (gastroesophageal reflux disease)     Patient Active Problem List   Diagnosis Date Noted  . Asthma 12/25/2011  . Shortness of breath 12/12/2011  . Anemia 12/12/2011    Past Surgical History:  Procedure Laterality Date  . CESAREAN SECTION  12/06/2011   Procedure: CESAREAN SECTION;  Surgeon: Oliver PilaKathy W Richardson, MD;  Location: [facility];  Service: Gynecology;  Laterality: N/A;    OB History    Gravida Para Term Preterm AB Living   1 1 1  0 0 1   SAB TAB Ectopic Multiple Live Births   0 0 0 0 1       Home Medications    Prior to Admission medications   Medication Sig Start Date End Date Taking? Authorizing Provider  sodium-potassium bicarbonate (ALKA-SELTZER GOLD) TBEF dissolvable tablet Take 1 tablet by mouth daily as needed (cold symptoms).   Yes Historical Provider, MD  albuterol (PROVENTIL HFA;VENTOLIN HFA) 108 (90 BASE) MCG/ACT inhaler Inhale 2 puffs into the lungs every 6 (six) hours as needed for wheezing or shortness of breath (shortness of breath). For shortness of breath.    Historical Provider, MD  benzonatate (TESSALON PERLES) 100 MG  capsule Take 1-2 capsules (100-200 mg total) by mouth 3 (three) times daily as needed for cough. Patient not taking: Reported on 03/29/2016 10/24/14   Ozella Rocksavid J Merrell, MD  fluticasone [facility](FLONASE) 50 MCG/ACT nasal spray Place 2 sprays into both nostrils at bedtime. Patient not taking: Reported on 03/29/2016 10/24/14   Ozella Rocksavid J Merrell, MD  ibuprofen (ADVIL,MOTRIN) 800 MG tablet Take 1 tablet (800 mg total) by mouth 3 (three) times daily. Patient not taking: Reported on 03/29/2016 05/30/15   Fayrene HelperBowie Tran, PA-C  ipratropium (ATROVENT) 0.06 % nasal spray Place 2 sprays into both nostrils 4 (four) times daily. Patient not taking: Reported on 03/29/2016 10/24/14   Ozella Rocksavid J Merrell, MD  methocarbamol (ROBAXIN) 500 MG tablet Take 1 tablet (500 mg total) by mouth 2 (two) times daily. Patient not taking: Reported on 03/29/2016 05/30/15   Fayrene HelperBowie Tran, PA-C    Family History Family History  Problem Relation Age of Onset  . Asthma Father     Social History Social History  Substance Use Topics  . Smoking status: Never Smoker  . Smokeless tobacco: Never Used  . Alcohol use Yes     Allergies   Caffeine   Review of Systems Review of Systems  All other systems reviewed and are negative.    Physical Exam Updated Vital Signs BP 124/67 (BP Location: Left Arm)   Pulse 83  Temp 98.5 F (36.9 C) (Oral)   Resp 20   LMP 03/21/2016   SpO2 100%   Physical Exam  Constitutional: She is oriented to person, place, and time. She appears well-developed and well-nourished.  Non-toxic appearance. No distress.  HENT:  Head: Normocephalic and atraumatic.  Eyes: Conjunctivae, EOM and lids are normal. Pupils are equal, round, and reactive to light.  Neck: Normal range of motion. Neck supple. No tracheal deviation present. No thyroid mass present.  Cardiovascular: Normal rate, regular rhythm and normal heart sounds.  Exam reveals no gallop.   No murmur heard. Pulmonary/Chest: Effort normal. No stridor. No  respiratory distress. She has decreased breath sounds in the right upper field and the left upper field. She has no wheezes. She has no rhonchi. She has no rales.    Abdominal: Soft. Normal appearance and bowel sounds are normal. She exhibits no distension. There is no tenderness. There is no rebound and no CVA tenderness.  Musculoskeletal: Normal range of motion. She exhibits no edema or tenderness.  Neurological: She is alert and oriented to person, place, and time. She has normal strength. No cranial nerve deficit or sensory deficit. GCS eye subscore is 4. GCS verbal subscore is 5. GCS motor subscore is 6.  Skin: Skin is warm and dry. No abrasion and no rash noted.  Psychiatric: She has a normal mood and affect. Her speech is normal and behavior is normal.  Nursing note and vitals reviewed.    ED Treatments / Results  Labs (all labs ordered are listed, but only abnormal results are displayed) Labs Reviewed  CBC - Abnormal; Notable for the following:       Result Value   Hemoglobin 11.0 (*)    HCT 35.8 (*)    All other components within normal limits  BASIC METABOLIC PANEL  D-DIMER, QUANTITATIVE (NOT AT [facility])  I-STAT TROPOININ, ED    EKG  EKG Interpretation  Date/Time:  Monday March 29 2016 16:33:16 EDT Ventricular Rate:  83 PR Interval:    QRS Duration: 85 QT Interval:  342 QTC Calculation: 402 R Axis:   56 Text Interpretation:  Sinus rhythm No significant change since last tracing Confirmed by Freida Busman  MD, Kyanne Rials ([id number]) on 03/29/2016 9:04:28 PM       Radiology Dg Chest 2 View  Result Date: 03/29/2016 CLINICAL DATA:  Productive cough EXAM: CHEST  2 VIEW COMPARISON:  08/25/2014 FINDINGS: The heart size and mediastinal contours are within normal limits. Diffuse bronchial wall thickening identified. Both lungs are clear. The visualized skeletal structures are unremarkable. IMPRESSION: 1. Diffuse bronchial wall thickening. 2. No airspace consolidation Electronically  Signed   By: Signa Kell M.D.   On: 03/29/2016 17:28    Procedures Procedures (including critical care time)  Medications Ordered in ED Medications  albuterol (PROVENTIL) (2.5 MG/3ML) 0.083% nebulizer solution 5 mg (not administered)  ipratropium (ATROVENT) nebulizer solution 0.5 mg (not administered)     Initial Impression / Assessment and Plan / ED Course  I have reviewed the triage vital signs and the nursing notes.  Pertinent labs & imaging results that were available during my care of the patient were reviewed by me and considered in my medical decision making (see chart for details).  Clinical Course    Patient given albuterol and prednisone here and feels better. D-dimer negative. Do not think that the services ACS. 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  Final Clinical Impressions(s) / ED Diagnoses   Final diagnoses:  None    New Prescriptions New Prescriptions   No medications on file     Lorre Nick, MD 03/29/16 2315

## 2016-03-29 NOTE — ED Notes (Signed)
MD at bedside. 

## 2016-07-28 ENCOUNTER — Ambulatory Visit (HOSPITAL_COMMUNITY)
Admission: EM | Admit: 2016-07-28 | Discharge: 2016-07-28 | Disposition: A | Discharge status: Home / Self Care | Payer: BLUE CROSS/BLUE SHIELD | Attending: Emergency Medicine | Admitting: Emergency Medicine

## 2016-07-28 ENCOUNTER — Encounter (HOSPITAL_COMMUNITY): Payer: Self-pay | Admitting: Emergency Medicine

## 2016-07-28 DIAGNOSIS — J00 Acute nasopharyngitis [common cold]: Secondary | ICD-10-CM | POA: Diagnosis not present

## 2016-07-28 MED ORDER — IPRATROPIUM BROMIDE 0.06 % NA SOLN: 2.0000 | Freq: Four times a day (QID) | NASAL | 0 refills | Status: DC

## 2016-07-28 MED ORDER — AZITHROMYCIN 250 MG PO TABS: 250.0000 mg | ORAL_TABLET | Freq: Every day | ORAL | 0 refills | Status: DC

## 2016-07-28 NOTE — ED Triage Notes (Signed)
Sore throat, coughing, congestion in head, drainage in throat, head feels like someone is squeezing it.  Onset Saturday of symptoms

## 2016-07-28 NOTE — ED Provider Notes (Signed)
CSN: [id number]     Arrival date & time 07/28/16  1323 History   First MD Initiated Contact with Patient 07/28/16 1412     Chief Complaint  Patient presents with  . URI   (Consider location/radiation/quality/duration/timing/severity/associated sxs/prior Treatment) Patient c/o sore throat and uri sx's for 4 days.   The history is provided by the patient.  URI  Presenting symptoms: congestion and fatigue   Severity:  Moderate Onset quality:  Sudden Duration:  4 hours Timing:  Constant Progression:  Waxing and waning Chronicity:  New Relieved by:  Nothing Worsened by:  Nothing Associated symptoms: arthralgias     Past Medical History:  Diagnosis Date  . Allergy   . Asthma   . GERD (gastroesophageal reflux disease)    Past Surgical History:  Procedure Laterality Date  . CESAREAN SECTION  12/06/2011   Procedure: CESAREAN SECTION;  Surgeon: Oliver PilaKathy W Richardson, MD;  Location: [facility];  Service: Gynecology;  Laterality: N/A;   Family History  Problem Relation Age of Onset  . Asthma Father    Social History  Substance Use Topics  . Smoking status: Never Smoker  . Smokeless tobacco: Never Used  . Alcohol use Yes   OB History    Gravida Para Term Preterm AB Living   1 1 1  0 0 1   SAB TAB Ectopic Multiple Live Births   0 0 0 0 1     Review of Systems  Constitutional: Positive for fatigue.  HENT: Positive for congestion.   Eyes: Negative.   Respiratory: Negative.   Cardiovascular: Negative.   Gastrointestinal: Negative.   Endocrine: Negative.   Genitourinary: Negative.   Musculoskeletal: Positive for arthralgias.  Allergic/Immunologic: Negative.   Neurological: Negative.   Hematological: Negative.   Psychiatric/Behavioral: Negative.     Allergies  Caffeine  Home Medications   Prior to Admission medications   Medication Sig Start Date End Date Taking? Authorizing Provider  albuterol (PROVENTIL HFA;VENTOLIN HFA) 108 (90 BASE) MCG/ACT inhaler Inhale 2 puffs  into the lungs every 6 (six) hours as needed for wheezing or shortness of breath (shortness of breath). For shortness of breath.    Historical Provider, MD  azithromycin (ZITHROMAX) 250 MG tablet Take 1 tablet (250 mg total) by mouth daily. Take first 2 tablets together, then 1 every day until finished. 07/28/16   Deatra CanterWilliam J Alyxis Grippi, FNP  benzonatate (TESSALON PERLES) 100 MG capsule Take 1-2 capsules (100-200 mg total) by mouth 3 (three) times daily as needed for cough. Patient not taking: Reported on 03/29/2016 10/24/14   Ozella Rocksavid J Merrell, MD  fluticasone [facility](FLONASE) 50 MCG/ACT nasal spray Place 2 sprays into both nostrils at bedtime. Patient not taking: Reported on 03/29/2016 10/24/14   Ozella Rocksavid J Merrell, MD  ibuprofen (ADVIL,MOTRIN) 800 MG tablet Take 1 tablet (800 mg total) by mouth 3 (three) times daily. Patient not taking: Reported on 03/29/2016 05/30/15   Fayrene HelperBowie Tran, PA-C  ipratropium (ATROVENT) 0.06 % nasal spray Place 2 sprays into both nostrils 4 (four) times daily. Patient not taking: Reported on 03/29/2016 10/24/14   Ozella Rocksavid J Merrell, MD  ipratropium (ATROVENT) 0.06 % nasal spray Place 2 sprays into both nostrils 4 (four) times daily. 07/28/16   Deatra CanterWilliam J Gradie Butrick, FNP  methocarbamol (ROBAXIN) 500 MG tablet Take 1 tablet (500 mg total) by mouth 2 (two) times daily. Patient not taking: Reported on 03/29/2016 05/30/15   Fayrene HelperBowie Tran, PA-C  predniSONE (DELTASONE) 50 MG tablet 1 by mouth daily 5 Patient not taking: Reported on  07/28/2016 03/29/16   Lorre Nick, MD  sodium-potassium bicarbonate (ALKA-SELTZER GOLD) TBEF dissolvable tablet Take 1 tablet by mouth daily as needed (cold symptoms).    Historical Provider, MD   Meds Ordered and Administered this Visit  Medications - No data to display  BP 104/72 (BP Location: Right Arm) Comment (BP Location): large cuff  Pulse 87   Temp 98.3 F (36.8 C) (Oral)   Resp 22   SpO2 99%  No data found.   Physical Exam  Constitutional: She appears well-developed  and well-nourished.  HENT:  Head: Normocephalic and atraumatic.  Right Ear: External ear normal.  Left Ear: External ear normal.  Mouth/Throat: Oropharynx is clear and moist.  Eyes: Conjunctivae and EOM are normal. Pupils are equal, round, and reactive to light.  Neck: Normal range of motion. Neck supple.  Cardiovascular: Normal rate, regular rhythm and normal heart sounds.   Pulmonary/Chest: Effort normal and breath sounds normal.  Abdominal: Soft. Bowel sounds are normal. She exhibits no distension.  Nursing note and vitals reviewed.   Urgent Care Course     Procedures (including critical care time)  Labs Review Labs Reviewed - No data to display  Imaging Review No results found.   Visual Acuity Review  Right Eye Distance:   Left Eye Distance:   Bilateral Distance:    Right Eye Near:   Left Eye Near:    Bilateral Near:         MDM   1. Acute nasopharyngitis    Zpak as directed Atrovent Nasal Spray  Push po fluids, rest, tylenol and motrin otc prn as directed for fever, arthralgias, and myalgias.  Follow up prn if sx's continue or persist.    Deatra Canter, FNP 07/28/16 1426

## 2016-07-28 NOTE — ED Notes (Signed)
Provided gingerale/ice

## 2016-07-30 ENCOUNTER — Encounter (HOSPITAL_COMMUNITY): Payer: Self-pay | Admitting: Emergency Medicine

## 2016-07-30 ENCOUNTER — Ambulatory Visit (HOSPITAL_COMMUNITY)
Admission: EM | Admit: 2016-07-30 | Discharge: 2016-07-30 | Disposition: A | Discharge status: Home / Self Care | Payer: BLUE CROSS/BLUE SHIELD | Attending: Family Medicine | Admitting: Family Medicine

## 2016-07-30 DIAGNOSIS — R05 Cough: Secondary | ICD-10-CM

## 2016-07-30 DIAGNOSIS — R059 Cough, unspecified: Secondary | ICD-10-CM

## 2016-07-30 DIAGNOSIS — J069 Acute upper respiratory infection, unspecified: Secondary | ICD-10-CM | POA: Diagnosis not present

## 2016-07-30 MED ORDER — BENZONATATE 100 MG PO CAPS: 100.0000 mg | ORAL_CAPSULE | Freq: Three times a day (TID) | ORAL | 0 refills | Status: DC

## 2016-07-30 MED ORDER — PREDNISONE 10 MG (21) PO TBPK: ORAL_TABLET | ORAL | 0 refills | Status: DC

## 2016-07-30 NOTE — ED Provider Notes (Signed)
CSN: [id number]     Arrival date & time 07/30/16  1007 History   First MD Initiated Contact with Patient 07/30/16 1055     Chief Complaint  Patient presents with  . URI   (Consider location/radiation/quality/duration/timing/severity/associated sxs/prior Treatment) Patient evaluated in clinic on 07/28/16 and started on Azithromycin and a nasal spray for congestion. She reports her sore throat has improved but cough continues. Denies fever or shortness of breath. Cough is dry, hacking, no sputum production.   The history is provided by the patient.  URI    Past Medical History:  Diagnosis Date  . Allergy   . Asthma   . GERD (gastroesophageal reflux disease)    Past Surgical History:  Procedure Laterality Date  . CESAREAN SECTION  12/06/2011   Procedure: CESAREAN SECTION;  Surgeon: Oliver Pila, MD;  Location: [facility];  Service: Gynecology;  Laterality: N/A;   Family History  Problem Relation Age of Onset  . Asthma Father    Social History  Substance Use Topics  . Smoking status: Never Smoker  . Smokeless tobacco: Never Used  . Alcohol use Yes   OB History    Gravida Para Term Preterm AB Living   1 1 1  0 0 1   SAB TAB Ectopic Multiple Live Births   0 0 0 0 1     Review of Systems  Reason unable to perform ROS: as covered in HPI.  All other systems reviewed and are negative.   Allergies  Caffeine  Home Medications   Prior to Admission medications   Medication Sig Start Date End Date Taking? Authorizing Provider  albuterol (PROVENTIL HFA;VENTOLIN HFA) 108 (90 BASE) MCG/ACT inhaler Inhale 2 puffs into the lungs every 6 (six) hours as needed for wheezing or shortness of breath (shortness of breath). For shortness of breath.   Yes Historical Provider, MD  azithromycin (ZITHROMAX) 250 MG tablet Take 1 tablet (250 mg total) by mouth daily. Take first 2 tablets together, then 1 every day until finished. 07/28/16  Yes Deatra Canter, FNP  ibuprofen (ADVIL,MOTRIN) 800  MG tablet Take 1 tablet (800 mg total) by mouth 3 (three) times daily. 05/30/15  Yes Fayrene Helper, PA-C  benzonatate (TESSALON PERLES) 100 MG capsule Take 1-2 capsules (100-200 mg total) by mouth 3 (three) times daily as needed for cough. Patient not taking: Reported on 03/29/2016 10/24/14   Ozella Rocks, MD  benzonatate (TESSALON) 100 MG capsule Take 1 capsule (100 mg total) by mouth every 8 (eight) hours. 07/30/16   Dorena Bodo, NP  fluticasone (FLONASE) 50 MCG/ACT nasal spray Place 2 sprays into both nostrils at bedtime. Patient not taking: Reported on 03/29/2016 10/24/14   Ozella Rocks, MD  ipratropium (ATROVENT) 0.06 % nasal spray Place 2 sprays into both nostrils 4 (four) times daily. Patient not taking: Reported on 03/29/2016 10/24/14   Ozella Rocks, MD  ipratropium (ATROVENT) 0.06 % nasal spray Place 2 sprays into both nostrils 4 (four) times daily. 07/28/16   Deatra Canter, FNP  methocarbamol (ROBAXIN) 500 MG tablet Take 1 tablet (500 mg total) by mouth 2 (two) times daily. Patient not taking: Reported on 03/29/2016 05/30/15   Fayrene Helper, PA-C  predniSONE (DELTASONE) 50 MG tablet 1 by mouth daily 5 Patient not taking: Reported on 07/28/2016 03/29/16   Lorre Nick, MD  predniSONE (STERAPRED UNI-PAK 21 TAB) 10 MG (21) TBPK tablet Take 6 tablets today, decrease by 1 each day till finished. 07/30/16   Dorena Bodo,  NP  sodium-potassium bicarbonate (ALKA-SELTZER GOLD) TBEF dissolvable tablet Take 1 tablet by mouth daily as needed (cold symptoms).    Historical Provider, MD   Meds Ordered and Administered this Visit  Medications - No data to display  BP 138/85 (BP Location: Left Wrist)   Pulse 81   Temp 98.3 F (36.8 C) (Oral)   Resp 20   LMP 07/18/2016   SpO2 99%  No data found.   Physical Exam  Constitutional: She is oriented to person, place, and time. She appears well-developed and well-nourished. No distress.  HENT:  Head: Normocephalic.  Right Ear: External ear  normal.  Left Ear: External ear normal.  Nose: Nose normal.  Mouth/Throat: Oropharynx is clear and moist. No oropharyngeal exudate.  Cardiovascular: Normal rate and regular rhythm.   Pulmonary/Chest: Effort normal and breath sounds normal. No respiratory distress. She has no wheezes.  Abdominal: Soft. Bowel sounds are normal.  Lymphadenopathy:       Head (right side): No submandibular and no tonsillar adenopathy present.       Head (left side): No submandibular and no tonsillar adenopathy present.    She has no cervical adenopathy.  Neurological: She is alert and oriented to person, place, and time.  Skin: Skin is warm and dry. Capillary refill takes less than 2 seconds. She is not diaphoretic.  Nursing note reviewed.   Urgent Care Course     Procedures (including critical care time)  Labs Review Labs Reviewed - No data to display  Imaging Review No results found.   Visual Acuity Review  Right Eye Distance:   Left Eye Distance:   Bilateral Distance:    Right Eye Near:   Left Eye Near:    Bilateral Near:         MDM   1. Acute upper respiratory infection   2. Cough   I would advise you finish your antibiotics you were prescribed on 07/28/16. I have prescribed a cough medicine called tessalon, take 1 tablet every 8 hours as needed. I have also prescribed a prednisone taper, take 6 tablets today then decrease by 1 each day till finished (6,5,4,3,2,1). I would also recommend taking mucinex twice a day with a full glass of water.  Should your symptoms persist more than a week I would advise you follow up with primary care or return to clinic as needed.      Dorena BodoLawrence Shiheem Corporan, NP 07/30/16 1105

## 2016-07-30 NOTE — Discharge Instructions (Signed)
I would advise you finish your antibiotics you were prescribed on 07/28/16. I have prescribed a cough medicine called tessalon, take 1 tablet every 8 hours as needed. I have also prescribed a prednisone taper, take 6 tablets today then decrease by 1 each day till finished (6,5,4,3,2,1). I would also recommend taking mucinex twice a day with a full glass of water.  Should your symptoms persist more than a week I would advise you follow up with primary care or return to clinic as needed.

## 2016-07-30 NOTE — ED Triage Notes (Signed)
Pt c/o cold sx onset: 1 week... Seen here on 1/24 for similar sx.  Sx include: prod cough, SOB, vomiting due to cough, chest pain due cough   Denies: fevers  Taking: OTC cold meds w/no relief... Still taking her z-pack  A&O x4... NAD

## 2016-09-17 ENCOUNTER — Encounter (HOSPITAL_COMMUNITY): Payer: Self-pay

## 2016-09-17 ENCOUNTER — Emergency Department (HOSPITAL_COMMUNITY): Payer: BLUE CROSS/BLUE SHIELD

## 2016-09-17 DIAGNOSIS — Z79899 Other long term (current) drug therapy: Secondary | ICD-10-CM | POA: Insufficient documentation

## 2016-09-17 DIAGNOSIS — J209 Acute bronchitis, unspecified: Secondary | ICD-10-CM | POA: Insufficient documentation

## 2016-09-17 DIAGNOSIS — J45909 Unspecified asthma, uncomplicated: Secondary | ICD-10-CM | POA: Insufficient documentation

## 2016-09-17 MED ORDER — ALBUTEROL SULFATE (2.5 MG/3ML) 0.083% IN NEBU
5.0000 mg | INHALATION_SOLUTION | Freq: Once | RESPIRATORY_TRACT | Status: AC
  Administered 2016-09-17: 5 mg via RESPIRATORY_TRACT
  Filled 2016-09-17: qty 6

## 2016-09-17 NOTE — ED Triage Notes (Signed)
Pt states that she has been experiencing N/V/SOB, and cough since Thursday. Denies fevers. Slight wheezing noted in triage. A&Ox4.

## 2016-09-18 ENCOUNTER — Emergency Department (HOSPITAL_COMMUNITY)
Admission: EM | Admit: 2016-09-18 | Discharge: 2016-09-18 | Disposition: A | Discharge status: Home / Self Care | Payer: BLUE CROSS/BLUE SHIELD | Attending: Emergency Medicine | Admitting: Emergency Medicine

## 2016-09-18 DIAGNOSIS — J209 Acute bronchitis, unspecified: Secondary | ICD-10-CM

## 2016-09-18 LAB — BASIC METABOLIC PANEL
Anion gap: 6 (ref 5–15)
BUN: 14 mg/dL (ref 6–20)
CALCIUM: 10.2 mg/dL (ref 8.9–10.3)
CO2: 22 mmol/L (ref 22–32)
CREATININE: 0.7 mg/dL (ref 0.44–1.00)
Chloride: 108 mmol/L (ref 101–111)
Glucose, Bld: 125 mg/dL — ABNORMAL HIGH (ref 65–99)
Potassium: 4.2 mmol/L (ref 3.5–5.1)
Sodium: 136 mmol/L (ref 135–145)

## 2016-09-18 LAB — I-STAT TROPONIN, ED: Troponin i, poc: 0 ng/mL (ref 0.00–0.08)

## 2016-09-18 LAB — I-STAT BETA HCG BLOOD, ED (MC, WL, AP ONLY): I-stat hCG, quantitative: <5 m[IU]/mL (ref <5)

## 2016-09-18 MED ORDER — IPRATROPIUM-ALBUTEROL 0.5-2.5 (3) MG/3ML IN SOLN
3.0000 mL | Freq: Once | RESPIRATORY_TRACT | Status: AC
  Administered 2016-09-18: 3 mL via RESPIRATORY_TRACT
  Filled 2016-09-18: qty 3

## 2016-09-18 MED ORDER — PREDNISONE 10 MG PO TABS: 60.0000 mg | ORAL_TABLET | Freq: Every day | ORAL | 0 refills | Status: DC

## 2016-09-18 NOTE — ED Provider Notes (Signed)
[facility]-EMERGENCY DEPT Provider Note   CSN: [id number] Arrival date & time: 09/17/16  2108   By signing my name below, I, Clovis PuAvnee Patel, attest that this documentation has been prepared under the direction and in the presence of Derwood KaplanAnkit Randall Rampersad, MD  Electronically Signed: Clovis PuAvnee Patel, ED Scribe. 09/18/16. 4:03 AM.   History   Chief Complaint Chief Complaint  Patient presents with  . Cough  . Emesis    The history is provided by the patient. No language interpreter was used.   HPI Comments:  Jill Odom is a 31 y.o. female, with a PMHx of asthma, who presents to the Emergency Department complaining of acute onset, intermittent chest discomfort x 4 days. Her discomfort is only present when coughing. She also reports SOB which is worse with exertion, a productive cough with clear sputum, post-tussive vomiting and back pain. She notes her symptoms initially began with vomiting 4 days ago. 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 No alleviating factors noted. Pt denies dysuria, hematuria a hx of abdominal surgeries, a hx of pelvic disorders, a hx of blood clots, a family hx of clotting disorders or any other associated symptoms. She notes she does have an Implanon implant in place.   Past Medical History:  Diagnosis Date  . Allergy   . Asthma   . GERD (gastroesophageal reflux disease)     Patient Active Problem List   Diagnosis Date Noted  . Asthma 12/25/2011  . Shortness of breath 12/12/2011  . Anemia 12/12/2011    Past Surgical History:  Procedure Laterality Date  . CESAREAN SECTION  12/06/2011   Procedure: CESAREAN SECTION;  Surgeon: Oliver PilaKathy W Richardson, MD;  Location: [facility];  Service: Gynecology;  Laterality: N/A;    OB History    Gravida Para Term Preterm AB Living   1 1 1  0 0 1   SAB TAB Ectopic Multiple Live Births   0 0 0 0 1       Home Medications    Prior to Admission medications   Medication Sig Start  Date End Date Taking? Authorizing Provider  albuterol (PROVENTIL HFA;VENTOLIN HFA) 108 (90 BASE) MCG/ACT inhaler Inhale 2 puffs into the lungs every 6 (six) hours as needed for wheezing or shortness of breath (shortness of breath). For shortness of breath.   Yes Historical Provider, MD  ibuprofen (ADVIL,MOTRIN) 200 MG tablet Take 800 mg by mouth every 6 (six) hours as needed for moderate pain.   Yes Historical Provider, MD  ipratropium (ATROVENT) 0.06 % nasal spray Place 2 sprays into both nostrils 4 (four) times daily. Patient taking differently: Place 2 sprays into both nostrils 4 (four) times daily as needed for rhinitis.  07/28/16  Yes Deatra CanterWilliam J Oxford, FNP  azithromycin (ZITHROMAX) 250 MG tablet Take 1 tablet (250 mg total) by mouth daily. Take first 2 tablets together, then 1 every day until finished. Patient not taking: Reported on 09/18/2016 07/28/16   Deatra CanterWilliam J Oxford, FNP  benzonatate (TESSALON PERLES) 100 MG capsule Take 1-2 capsules (100-200 mg total) by mouth 3 (three) times daily as needed for cough. Patient not taking: Reported on 03/29/2016 10/24/14   Ozella Rocksavid J Merrell, MD  benzonatate (TESSALON) 100 MG capsule Take 1 capsule (100 mg total) by mouth every 8 (eight) hours. Patient not taking: Reported on 09/18/2016 07/30/16   Dorena BodoLawrence Kennard, NP  fluticasone [facility](FLONASE) 50 MCG/ACT nasal spray Place 2 sprays into both nostrils at bedtime. Patient  not taking: Reported on 03/29/2016 10/24/14   Ozella Rocks, MD  ibuprofen (ADVIL,MOTRIN) 800 MG tablet Take 1 tablet (800 mg total) by mouth 3 (three) times daily. Patient not taking: Reported on 09/18/2016 05/30/15   Fayrene Helper, PA-C  ipratropium (ATROVENT) 0.06 % nasal spray Place 2 sprays into both nostrils 4 (four) times daily. Patient not taking: Reported on 03/29/2016 10/24/14   Ozella Rocks, MD  methocarbamol (ROBAXIN) 500 MG tablet Take 1 tablet (500 mg total) by mouth 2 (two) times daily. Patient not taking: Reported on 03/29/2016 05/30/15    Fayrene Helper, PA-C  predniSONE (DELTASONE) 10 MG tablet Take 6 tablets (60 mg total) by mouth daily. 09/18/16   Derwood Kaplan, MD    Family History Family History  Problem Relation Age of Onset  . Asthma Father     Social History Social History  Substance Use Topics  . Smoking status: Never Smoker  . Smokeless tobacco: Never Used  . Alcohol use Yes     Allergies   Caffeine   Review of Systems Review of Systems  10 systems reviewed and all are negative for acute change except as noted in the HPI.  Physical Exam Updated Vital Signs BP 128/66   Pulse 96   Temp 98.8 F (37.1 C) (Oral)   Resp 20   Ht 5\' 3"  (1.6 m)   Wt (!) 338 lb (153.3 kg)   SpO2 100%   BMI 59.87 kg/m   Physical Exam  Constitutional: She is oriented to person, place, and time. She appears well-developed and well-nourished. No distress.  HENT:  Head: Normocephalic and atraumatic.  Eyes: EOM are normal.  Neck: Normal range of motion.  Cardiovascular: Normal rate, regular rhythm and normal heart sounds.   Pulmonary/Chest: Effort normal and breath sounds normal. She has no wheezes.  Lungs are clear slightly tight however no wheezing appreciated.  Abdominal: Soft. She exhibits no distension. There is no tenderness.  Musculoskeletal: Normal range of motion. She exhibits tenderness.  Sacral spine tenderness   Neurological: She is alert and oriented to person, place, and time.  Skin: Skin is warm and dry.  Psychiatric: She has a normal mood and affect. Judgment normal.  Nursing note and vitals reviewed.   ED Treatments / Results  DIAGNOSTIC STUDIES:  Oxygen Saturation is 100% on RA, normal by my interpretation.    COORDINATION OF CARE:  3:45 AM Discussed treatment plan with pt at bedside and pt agreed to plan.  Labs (all labs ordered are listed, but only abnormal results are displayed) Labs Reviewed  BASIC METABOLIC PANEL - Abnormal; Notable for the following:       Result Value   Glucose,  Bld 125 (*)    All other components within normal limits  CBC WITH DIFFERENTIAL/PLATELET  CBC WITH DIFFERENTIAL/PLATELET  I-STAT BETA HCG BLOOD, ED ([facility], [facility], [facility] ONLY)  I-STAT TROPOININ, ED    EKG  EKG Interpretation None       Radiology Dg Chest 2 View  Result Date: 09/17/2016 CLINICAL DATA:  Chest pain productive cough EXAM: CHEST  2 VIEW COMPARISON:  03/29/2016 FINDINGS: No focal consolidation or pleural effusion. Small interstitial perihilar opacities. Stable cardiomediastinal silhouette. No pneumothorax. IMPRESSION: No focal pulmonary infiltrate Electronically Signed   By: Jasmine Pang M.D.   On: 09/17/2016 22:39    Procedures Procedures (including critical care time)  Medications Ordered in ED Medications  albuterol (PROVENTIL) (2.5 MG/3ML) 0.083% nebulizer solution 5 mg (5 mg Nebulization Given 09/17/16 2201)  ipratropium-albuterol (DUONEB) 0.5-2.5 (3) MG/3ML nebulizer solution 3 mL (3 mLs Nebulization Given 09/18/16 0519)     Initial Impression / Assessment and Plan / ED Course  I have reviewed the triage vital signs and the nursing notes.  Pertinent labs & imaging results that were available during my care of the patient were reviewed by me and considered in my medical decision making (see chart for details).  Clinical Course as of Sep 19 638  Sat Sep 18, 2016  1610 Repeat exam reveals clearing of wheezing in all lung fields. Patient is not in any respiratory distress nor is there hypoxia.   [AN]  [id number] Results from the ER workup discussed with the patient face to face and all questions answered to the best of my ability.  Strict ER return precautions have been discussed, and patient is agreeing with the plan and is comfortable with the workup done and the recommendations from the ER.   [AN]    Clinical Course User Index [AN] Derwood Kaplan, MD    Pt comes in with cc of dib. Pt has cough - with that some chest discomfort. She also reports exertional dib.    Lungs are tight on exam - suspect asthma exacerbation. Bronchitis, uri and PE also possible.  Pt has no hx of PE, DVT and denies any exogenous hormone (testosterone / estrogen) use, long distance travels or surgery in the past 6 weeks, active cancer, recent immobilization.  Pt will be reassessed post lung exam - if she is better, we will d/c as asthma/bronchiits. Otherwise, we will get dimer and do a PE rule out.  Final Clinical Impressions(s) / ED Diagnoses   Final diagnoses:  Acute bronchitis, unspecified organism    New Prescriptions New Prescriptions   PREDNISONE (DELTASONE) 10 MG TABLET    Take 6 tablets (60 mg total) by mouth daily.  I personally performed the services described in this documentation, which was scribed in my presence. The recorded information has been reviewed and is accurate.     Derwood Kaplan, MD 09/18/16 [phone]

## 2016-09-18 NOTE — ED Notes (Signed)
Pt ambulated well in the hall without assistance---- pt's O2 saturation remained 98% on room air while ambulating; pt did report some shortness of breath while ambulating.

## 2016-09-18 NOTE — Discharge Instructions (Signed)
We saw you in the ER for the chest pain/shortness of breath. All of our cardiac workup is normal, including labs, EKG and chest X-RAY are normal. We suspect you have bronchitis. Take the meds prescribed.  Please return to the ER if you have worsening chest pain, shortness of breath, pain radiating to your jaw, shoulder, or back, sweats or fainting or bloody phlegm.

## 2016-09-18 NOTE — ED Notes (Signed)
PATIENT REFUSED TO HAVE VITALS RECHECK

## 2016-11-03 ENCOUNTER — Ambulatory Visit (HOSPITAL_COMMUNITY)
Admission: EM | Admit: 2016-11-03 | Discharge: 2016-11-03 | Disposition: A | Discharge status: Home / Self Care | Payer: BLUE CROSS/BLUE SHIELD | Attending: Emergency Medicine | Admitting: Emergency Medicine

## 2016-11-03 ENCOUNTER — Encounter (HOSPITAL_COMMUNITY): Payer: Self-pay | Admitting: Emergency Medicine

## 2016-11-03 DIAGNOSIS — H5789 Other specified disorders of eye and adnexa: Secondary | ICD-10-CM

## 2016-11-03 DIAGNOSIS — H578 Other specified disorders of eye and adnexa: Secondary | ICD-10-CM

## 2016-11-03 MED ORDER — FLUORESCEIN SODIUM 0.6 MG OP STRP
ORAL_STRIP | OPHTHALMIC | Status: AC
  Filled 2016-11-03: qty 5

## 2016-11-03 MED ORDER — TETRACAINE HCL 0.5 % OP SOLN
OPHTHALMIC | Status: AC
  Filled 2016-11-03: qty 2

## 2016-11-03 MED ORDER — POLYETHYL GLYCOL-PROPYL GLYCOL 0.4-0.3 % OP SOLN: 2.0000 [drp] | Freq: Four times a day (QID) | OPHTHALMIC | 0 refills | Status: DC | PRN

## 2016-11-03 NOTE — Discharge Instructions (Signed)
I have discussed your case with Dr. Sherryll Burger, the on call Ophthalmologist. We have prescribed Systane eye drops, use 1-2 drops as needed 4 or more times throughout the day. Also, take an over the counter antihistamine daily. Contact Dr. Margaretmary Eddy office to schedule an appointment for follow up care. Also, you may do cold compresses 4 or more times throughout the day.

## 2016-11-03 NOTE — ED Triage Notes (Signed)
The patient presented to the [facility] with a complaint of right eye swelling and pain x 10 days. The patient denied any known injury.

## 2016-11-03 NOTE — ED Provider Notes (Signed)
CSN: [id number]     Arrival date & time 11/03/16  1049 History   None    Chief Complaint  Patient presents with  . Eye Pain   (Consider location/radiation/quality/duration/timing/severity/associated sxs/prior Treatment) 31 year old female presents to clinic for evaluation of right knee pain and discomfort ongoing for 10 days. States the pain is 5 out of 10, has no discharge, or crusting, denies sensation of any foreign body within the eye, no visual disturbance, no sensation of halos around lights, no light sensitivity, no sensation of a shade dropping down over the eye, no history of diabetes, glaucoma, hypertension. 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 Following treatment, she states she's had no issues since. She does not work in any environment where there are foreign particulates such as around lumbar, or metal.   The history is provided by the patient.    Past Medical History:  Diagnosis Date  . Allergy   . Asthma   . GERD (gastroesophageal reflux disease)    Past Surgical History:  Procedure Laterality Date  . CESAREAN SECTION  12/06/2011   Procedure: CESAREAN SECTION;  Surgeon: Oliver Pila, MD;  Location: [facility];  Service: Gynecology;  Laterality: N/A;   Family History  Problem Relation Age of Onset  . Asthma Father    Social History  Substance Use Topics  . Smoking status: Never Smoker  . Smokeless tobacco: Never Used  . Alcohol use Yes   OB History    Gravida Para Term Preterm AB Living   0 0 1   SAB TAB Ectopic Multiple Live Births   0 0 0 0 1     Review of Systems  Constitutional: Negative.   HENT: Negative.   Eyes: Positive for redness and itching. Negative for photophobia, discharge and visual disturbance.  Respiratory: Negative.   Cardiovascular: Negative.   Gastrointestinal: Negative.   Musculoskeletal: Negative.   Neurological: Negative.       Allergies  Caffeine  Home Medications   Prior to Admission medications   Medication Sig Start Date End Date Taking? Authorizing Provider  Polyethyl Glycol-Propyl Glycol 0.4-0.3 % SOLN Apply 2 drops to eye 4 (four) times daily as needed. 11/03/16   Dorena Bodo, NP   Meds Ordered and Administered this Visit  Medications - No data to display  BP (!) 147/80 (BP Location: Right Wrist)   Pulse 77   Temp 98.5 F (36.9 C) (Oral)   Resp 20   SpO2 100%  No data found.   Physical Exam  Constitutional: She is oriented to person, place, and time. She appears well-developed and well-nourished.  HENT:  Head: Normocephalic and atraumatic.  Right Ear: External ear normal.  Left Ear: External ear normal.  Eyes:  Left eye injected, no visible, or noted foreign body, is a small growth at the 7:00 position, and lesion with fluorescein uptake. No apparent abnormalities on funduscopic exam.  Neck: Normal range of motion.  Cardiovascular: Normal rate and regular rhythm.   Pulmonary/Chest: Effort normal and breath sounds normal.  Abdominal: Soft. Bowel sounds are normal.  Neurological: She is alert and oriented to person, place, and time.  Skin: Skin is warm and dry. Capillary refill takes less than 2 seconds. She is not diaphoretic.  Psychiatric: She has a normal mood and affect. Her behavior is normal.  Nursing note and vitals reviewed.  Urgent Care Course     Procedures (including critical care time)  Labs Review Labs Reviewed - No data to display  Imaging Review No results found.   Visual Acuity Review  Right Eye Distance: 20/30 Left Eye Distance: 20/30 Bilateral Distance: 20/30  Right Eye Near:   Left Eye Near:    Bilateral Near:         MDM   1. Eye inflammation     Discussed case with attending, and with on-call ophthalmologist, starting on Systane eyedrops, oral antihistamines, and making a referral within the week to Dr. Sherryll Burger for  follow-up care.     Dorena Bodo, NP 11/03/16 1354

## 2017-05-03 ENCOUNTER — Other Ambulatory Visit (HOSPITAL_COMMUNITY)
Admission: RE | Admit: 2017-05-03 | Discharge: 2017-05-03 | Disposition: A | Discharge status: Home / Self Care | Payer: Medicaid Other | Source: Ambulatory Visit | Attending: Obstetrics | Admitting: Obstetrics

## 2017-05-03 ENCOUNTER — Encounter: Payer: Self-pay | Admitting: Obstetrics

## 2017-05-03 ENCOUNTER — Ambulatory Visit (INDEPENDENT_AMBULATORY_CARE_PROVIDER_SITE_OTHER): Payer: Medicaid Other | Admitting: Obstetrics

## 2017-05-03 VITALS — BP 122/81 | HR 90 | Ht 63.0 in | Wt 373.4 lb

## 2017-05-03 DIAGNOSIS — Z0000 Encounter for general adult medical examination without abnormal findings: Secondary | ICD-10-CM | POA: Diagnosis not present

## 2017-05-03 DIAGNOSIS — Z01419 Encounter for gynecological examination (general) (routine) without abnormal findings: Secondary | ICD-10-CM | POA: Diagnosis not present

## 2017-05-03 DIAGNOSIS — N898 Other specified noninflammatory disorders of vagina: Secondary | ICD-10-CM | POA: Diagnosis not present

## 2017-05-03 DIAGNOSIS — R102 Pelvic and perineal pain: Secondary | ICD-10-CM

## 2017-05-03 DIAGNOSIS — Z3202 Encounter for pregnancy test, result negative: Secondary | ICD-10-CM | POA: Diagnosis not present

## 2017-05-03 DIAGNOSIS — Z3046 Encounter for surveillance of implantable subdermal contraceptive: Secondary | ICD-10-CM

## 2017-05-03 DIAGNOSIS — L0292 Furuncle, unspecified: Secondary | ICD-10-CM

## 2017-05-03 LAB — POCT URINE PREGNANCY: Preg Test, Ur: NEGATIVE

## 2017-05-03 LAB — POCT URINALYSIS DIPSTICK
BILIRUBIN UA: NEGATIVE
GLUCOSE UA: NEGATIVE
Leukocytes, UA: NEGATIVE
Nitrite, UA: NEGATIVE
Urobilinogen, UA: 0.2 E.U./dL
pH, UA: 6 (ref 5.0–8.0)

## 2017-05-03 MED ORDER — CLINDAMYCIN PHOSPHATE 1 % EX SOLN: Freq: Two times a day (BID) | CUTANEOUS | 0 refills | Status: DC

## 2017-05-03 NOTE — Progress Notes (Signed)
Patient is in the office for GYN visit, currently has nexplanon that was placed 02-2012. Patient complains that she was previously having chest pains and right pelvic pain, and is concerned because she keeps having recurrent boils around the time of her menstrual cycle. Patient desires to have nexplanon taken out and another re-inserted.

## 2017-05-03 NOTE — Progress Notes (Signed)
Subjective:        Jill Odom is a 31 y.o. female here for a routine exam.  Current complaints: Pelvic pain.  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  Personal health questionnaire:  Is patient Ashkenazi Jewish, have a family history of breast and/or ovarian cancer: no Is there a family history of uterine cancer diagnosed at age < 61, gastrointestinal cancer, urinary tract cancer, family member who is a Personnel officer syndrome-associated carrier: no Is the patient overweight and hypertensive, family history of diabetes, personal history of gestational diabetes, preeclampsia or PCOS: no Is patient over 7, have PCOS,  family history of premature CHD under age 62, diabetes, smoke, have hypertension or peripheral artery disease:  no At any time, has a partner hit, kicked or otherwise hurt or frightened you?: no Over the past 2 weeks, have you felt down, depressed or hopeless?: no Over the past 2 weeks, have you felt little interest or pleasure in doing things?:no   Gynecologic History Patient's last menstrual period was 04/18/2017. Contraception: Nexplanon Last Pap: 2013. Results were: normal Last mammogram: n/a. Results were: normal  Obstetric History OB History  Gravida Para Term Preterm AB Living  1 1 1  0 0 1  SAB TAB Ectopic Multiple Live Births  0 0 0 0 1    # Outcome Date GA Lbr Len/2nd Weight Sex Delivery Anes PTL Lv  1 Term 12/06/11 [redacted]w[redacted]d  8 lb 1.8 oz (3.68 kg) M CS-LTranv EPI  LIV      Past Medical History:  Diagnosis Date  . Allergy   . Asthma   . GERD (gastroesophageal reflux disease)     Past Surgical History:  Procedure Laterality Date  . CESAREAN SECTION  12/06/2011   Procedure: CESAREAN SECTION;  Surgeon: Oliver Pila, MD;  Location: [facility];  Service: Gynecology;  Laterality: N/A;     Current Outpatient Prescriptions:  .  clindamycin (CLEOCIN-T) 1 % external solution, Apply  topically 2 (two) times daily., Disp: 30 mL, Rfl: 0 .  Polyethyl Glycol-Propyl Glycol 0.4-0.3 % SOLN, Apply 2 drops to eye 4 (four) times daily as needed. (Patient not taking: Reported on 05/03/2017), Disp: 5 mL, Rfl: 0 Allergies  Allergen Reactions  . Caffeine Other (See Comments)    Itching and throat swelling.    Social History  Substance Use Topics  . Smoking status: Never Smoker  . Smokeless tobacco: Never Used  . Alcohol use Yes    Family History  Problem Relation Age of Onset  . Asthma Father   . Hypertension Mother   . Hypertension Sister   . Hypertension Maternal Grandmother   . Hypertension Maternal Grandfather   . Hypertension Paternal Grandmother   . Hypertension Paternal Grandfather       Review of Systems  Constitutional: negative for fatigue and weight loss Respiratory: negative for cough and wheezing Cardiovascular: negative for chest pain, fatigue and palpitations Gastrointestinal: negative for abdominal pain and change in bowel habits Musculoskeletal:negative for myalgias Neurological: negative for gait problems and tremors Behavioral/Psych: negative for abusive relationship, depression Endocrine: negative for temperature intolerance    Genitourinary:negative for abnormal menstrual periods, genital lesions, hot flashes, sexual problems and vaginal discharge Integument/breast: negative for breast lump, breast tenderness, nipple discharge and skin lesion(s)    Objective:       BP 122/81   Pulse 90   Ht 5\' 3"  (1.6 m)   Wt (!) 373 lb  6.4 oz (169.4 kg)   LMP 04/18/2017   BMI 66.14 kg/m  General:   alert  Skin:   no rash or abnormalities  Lungs:   clear to auscultation bilaterally  Heart:   regular rate and rhythm, S1, S2 normal, no murmur, click, rub or gallop  Breasts:   normal without suspicious masses, skin or nipple changes or axillary nodes  Abdomen:  normal findings: no organomegaly, soft, non-tender and no hernia  Pelvis:  External genitalia:  normal general appearance Urinary system: urethral meatus normal and bladder without fullness, nontender Vaginal: normal without tenderness, induration or masses Cervix: normal appearance Adnexa: normal bimanual exam Uterus: anteverted and non-tender, normal size   Lab Review Urine pregnancy test Labs reviewed yes Radiologic studies reviewed no  50% of 20 min visit spent on counseling and coordination of care.    Assessment:     1. Encounter for routine gynecological examination with Papanicolaou smear of cervix Rx: - Cytology - PAP - POCT urinalysis dipstick - Pregnancy, urine  2. Pelvic pain Rx: - US PELVIC COMPLETE WITH TRANSVAGINAL; Future  3. Boil Rx: - clindamycin (CLEOCIN-T) 1 % external solution; Apply topically 2 (two) times daily.  Dispense: 30 mL; Refill: 0  4. Encounter for surveillance of implantable subdermal contraceptive - expired - wants Nexplanon removed and replaced with new Nexplanon  5. Vaginal discharge Rx: - Cervicovaginal ancillary only   Plan:    Education reviewed: calcium supplements, depression evaluation, low fat, low cholesterol diet, safe sex/STD prevention, self breast exams and weight bearing exercise. Contraception: Nexplanon. Follow up in: 2 weeks.  Nexplanom Out - In.  Discuss results of ultrasound.  Meds ordered this encounter  Medications  . clindamycin (CLEOCIN-T) 1 % external solution    Sig: Apply topically 2 (two) times daily.    Dispense:  30 mL    Refill:  0   Orders Placed This Encounter  Procedures  . US PELVIC COMPLETE WITH TRANSVAGINAL    Standing Status:   Future    Standing Expiration Date:   07/03/2018    Order Specific Question:   Reason for Exam (SYMPTOM  OR DIAGNOSIS REQUIRED)    Answer:   Pelvic pain    Order Specific Question:   Preferred imaging location?    Answer:   [facility]  . Pregnancy, urine  . POCT urinalysis dipstick

## 2017-05-05 LAB — CERVICOVAGINAL ANCILLARY ONLY
Bacterial vaginitis: NEGATIVE
CANDIDA VAGINITIS: NEGATIVE
CHLAMYDIA, DNA PROBE: NEGATIVE
NEISSERIA GONORRHEA: NEGATIVE
TRICH (WINDOWPATH): NEGATIVE

## 2017-05-05 LAB — CYTOLOGY - PAP
Diagnosis: NEGATIVE
HPV: NOT DETECTED

## 2017-05-19 ENCOUNTER — Ambulatory Visit (HOSPITAL_COMMUNITY): Payer: Medicaid Other

## 2017-05-25 ENCOUNTER — Ambulatory Visit (HOSPITAL_COMMUNITY)
Admission: RE | Admit: 2017-05-25 | Discharge: 2017-05-25 | Disposition: A | Discharge status: Home / Self Care | Payer: Medicaid Other | Source: Ambulatory Visit | Attending: Obstetrics | Admitting: Obstetrics

## 2017-05-25 DIAGNOSIS — N83201 Unspecified ovarian cyst, right side: Secondary | ICD-10-CM | POA: Insufficient documentation

## 2017-05-25 DIAGNOSIS — R102 Pelvic and perineal pain: Secondary | ICD-10-CM | POA: Diagnosis present

## 2017-05-28 ENCOUNTER — Other Ambulatory Visit: Payer: Self-pay | Admitting: Obstetrics

## 2017-05-28 DIAGNOSIS — N83201 Unspecified ovarian cyst, right side: Secondary | ICD-10-CM

## 2017-05-30 ENCOUNTER — Ambulatory Visit: Payer: Medicaid Other | Admitting: Obstetrics

## 2017-06-03 ENCOUNTER — Ambulatory Visit (HOSPITAL_COMMUNITY): Payer: Medicaid Other

## 2017-06-16 ENCOUNTER — Ambulatory Visit: Payer: Medicaid Other | Admitting: Obstetrics

## 2017-06-21 ENCOUNTER — Ambulatory Visit: Payer: Medicaid Other | Admitting: Obstetrics

## 2017-07-21 ENCOUNTER — Ambulatory Visit (HOSPITAL_COMMUNITY): Payer: Medicaid Other | Attending: Obstetrics

## 2018-01-26 ENCOUNTER — Ambulatory Visit: Payer: Self-pay | Admitting: Physician Assistant

## 2018-01-26 ENCOUNTER — Ambulatory Visit (HOSPITAL_COMMUNITY)
Admission: EM | Admit: 2018-01-26 | Discharge: 2018-01-26 | Disposition: A | Discharge status: Home / Self Care | Payer: Medicaid Other | Attending: Emergency Medicine | Admitting: Emergency Medicine

## 2018-01-26 ENCOUNTER — Encounter (HOSPITAL_COMMUNITY): Payer: Self-pay | Admitting: Emergency Medicine

## 2018-01-26 ENCOUNTER — Other Ambulatory Visit: Payer: Self-pay

## 2018-01-26 DIAGNOSIS — Z8249 Family history of ischemic heart disease and other diseases of the circulatory system: Secondary | ICD-10-CM | POA: Diagnosis not present

## 2018-01-26 DIAGNOSIS — K219 Gastro-esophageal reflux disease without esophagitis: Secondary | ICD-10-CM | POA: Insufficient documentation

## 2018-01-26 DIAGNOSIS — N390 Urinary tract infection, site not specified: Secondary | ICD-10-CM | POA: Diagnosis not present

## 2018-01-26 DIAGNOSIS — R51 Headache: Secondary | ICD-10-CM | POA: Diagnosis not present

## 2018-01-26 DIAGNOSIS — R3915 Urgency of urination: Secondary | ICD-10-CM | POA: Insufficient documentation

## 2018-01-26 DIAGNOSIS — R319 Hematuria, unspecified: Secondary | ICD-10-CM | POA: Diagnosis not present

## 2018-01-26 DIAGNOSIS — J45909 Unspecified asthma, uncomplicated: Secondary | ICD-10-CM | POA: Insufficient documentation

## 2018-01-26 DIAGNOSIS — R112 Nausea with vomiting, unspecified: Secondary | ICD-10-CM | POA: Diagnosis not present

## 2018-01-26 DIAGNOSIS — R197 Diarrhea, unspecified: Secondary | ICD-10-CM | POA: Diagnosis not present

## 2018-01-26 LAB — POCT URINALYSIS DIP (DEVICE)
BILIRUBIN URINE: NEGATIVE
GLUCOSE, UA: NEGATIVE mg/dL
Ketones, ur: NEGATIVE mg/dL
NITRITE: NEGATIVE
Protein, ur: NEGATIVE mg/dL
UROBILINOGEN UA: 0.2 mg/dL (ref 0.0–1.0)
pH: 6 (ref 5.0–8.0)

## 2018-01-26 LAB — GLUCOSE, CAPILLARY: Glucose-Capillary: 98 mg/dL (ref 70–99)

## 2018-01-26 LAB — POCT PREGNANCY, URINE: Preg Test, Ur: NEGATIVE

## 2018-01-26 MED ORDER — ONDANSETRON 8 MG PO TBDP: 8.0000 mg | ORAL_TABLET | Freq: Three times a day (TID) | ORAL | 0 refills | Status: DC | PRN

## 2018-01-26 MED ORDER — ONDANSETRON 8 MG PO TBDP: ORAL_TABLET | ORAL | 0 refills | Status: DC

## 2018-01-26 MED ORDER — PHENAZOPYRIDINE HCL 200 MG PO TABS: 200.0000 mg | ORAL_TABLET | Freq: Three times a day (TID) | ORAL | 0 refills | Status: DC | PRN

## 2018-01-26 MED ORDER — NITROFURANTOIN MONOHYD MACRO 100 MG PO CAPS: 100.0000 mg | ORAL_CAPSULE | Freq: Two times a day (BID) | ORAL | 0 refills | Status: DC

## 2018-01-26 NOTE — ED Triage Notes (Signed)
Vomiting for 2-3 weeks and has had diarrhea.  Patient reports having headaches.

## 2018-01-26 NOTE — ED Provider Notes (Signed)
HPI  SUBJECTIVE:  Jill Odom is a 32 y.o. female who presents with 3 to 4 weeks of nausea, vomiting, diarrhea.  States that she will have 4-5 episodes of nonbilious, nonbloody emesis per day.  She states that she is tolerating salty and bland foods, liquids.  She reports multiple episodes of watery, nonbloody diarrhea, the number of episodes that she has varies daily.  She states that her stool is mostly watery but occasionally is soft.  She had a normal period 2 weeks ago.  She reports intermittent headaches along her temples as well.  She reports urinary urgency, frequency, low midline abdominal pressure after urinating for the past week.  She states that she is urinating more than she usually does.  No dysuria, cloudy or odorous urine, hematuria.  No vaginal bleeding.  No fevers during this entire time.  No unintentional weight loss.  She does report increased thirst.  No abdominal distention.  No raw or undercooked foods, questionable leftovers, recent travel, camping, sick contacts, recent antibiotics.  She has not tried anything for her symptoms.  No alleviating factors.  Symptoms are worse with eating greasy foods.  LMP: 2 weeks ago.  Past medical history negative for C. difficile, abdominal surgeries, diabetes, IBS, pancreatitis, gallbladder disease.  She has a history of gestational hypertension.  PMD: Nolene Ebbs, MD    Past Medical History:  Diagnosis Date  . Allergy   . Asthma   . GERD (gastroesophageal reflux disease)     Past Surgical History:  Procedure Laterality Date  . CESAREAN SECTION  12/06/2011   Procedure: CESAREAN SECTION;  Surgeon: Logan Bores, MD;  Location: [facility];  Service: Gynecology;  Laterality: N/A;    Family History  Problem Relation Age of Onset  . Asthma Father   . Hypertension Mother   . Hypertension Sister   . Hypertension Maternal Grandmother   . Hypertension Maternal Grandfather   . Hypertension Paternal Grandmother   . Hypertension  Paternal Grandfather     Social History   Tobacco Use  . Smoking status: Never Smoker  . Smokeless tobacco: Never Used  Substance Use Topics  . Alcohol use: Yes  . Drug use: No    No current facility-administered medications for this encounter.   Current Outpatient Medications:  .  nitrofurantoin, macrocrystal-monohydrate, (MACROBID) 100 MG capsule, Take 1 capsule (100 mg total) by mouth 2 (two) times daily. X 5 days, Disp: 10 capsule, Rfl: 0 .  ondansetron (ZOFRAN ODT) 8 MG disintegrating tablet, Take 1 tablet (8 mg total) by mouth every 8 (eight) hours as needed for nausea or vomiting., Disp: 20 tablet, Rfl: 0 .  phenazopyridine (PYRIDIUM) 200 MG tablet, Take 1 tablet (200 mg total) by mouth 3 (three) times daily as needed for pain., Disp: 6 tablet, Rfl: 0  Allergies  Allergen Reactions  . Caffeine Other (See Comments)    Itching and throat swelling.     ROS  As noted in HPI.   Physical Exam  BP (!) 101/55 (BP Location: Right Arm) Comment: large cuff  Pulse 96   Temp 98.7 F (37.1 C) (Oral)   Resp 18   SpO2 100%   Constitutional: Well developed, well nourished, no acute distress Eyes:  EOMI, conjunctiva normal bilaterally HENT: Normocephalic, atraumatic,mucus membranes moist Respiratory: Normal inspiratory effort lungs clear bilaterally.  Good air movement. Cardiovascular: Normal rate regular rhythm, no murmurs, rubs, gallop.  Cap refill less than 2 seconds. GI: Obese, soft, nontender, nondistended.  No guarding, rebound.  Negative Murphy, negative McBurney.  No suprapubic or flank tenderness. Back: No CVAT Skin: No rash, skin intact Musculoskeletal: no deformities Neurologic: Alert & oriented x 3, no focal neuro deficits Psychiatric: Speech and behavior appropriate   ED Course   Medications - No data to display  Orders Placed This Encounter  Procedures  . Urine culture    Standing Status:   Standing    Number of Occurrences:   1    Order Specific  Question:   Patient immune status    Answer:   Normal  . Glucose, capillary    Standing Status:   Standing    Number of Occurrences:   1  . Gastrointestinal Pathogen Panel PCR    Standing Status:   Future    Standing Expiration Date:   02/26/2018  . Pregnancy, urine POC    Standing Status:   Standing    Number of Occurrences:   1  . POCT urinalysis dip (device)    Standing Status:   Standing    Number of Occurrences:   1    Results for orders placed or performed during the hospital encounter of 01/26/18 (from the past 24 hour(s))  Glucose, capillary     Status: None   Collection Time: 01/26/18  6:27 PM  Result Value Ref Range   Glucose-Capillary 98 70 - 99 mg/dL  POCT urinalysis dip (device)     Status: Abnormal   Collection Time: 01/26/18  6:41 PM  Result Value Ref Range   Glucose, UA NEGATIVE NEGATIVE mg/dL   Bilirubin Urine NEGATIVE NEGATIVE   Ketones, ur NEGATIVE NEGATIVE mg/dL   Specific Gravity, Urine >=1.030 1.005 - 1.030   Hgb urine dipstick MODERATE (A) NEGATIVE   pH 6.0 5.0 - 8.0   Protein, ur NEGATIVE NEGATIVE mg/dL   Urobilinogen, UA 0.2 0.0 - 1.0 mg/dL   Nitrite NEGATIVE NEGATIVE   Leukocytes, UA SMALL (A) NEGATIVE  Pregnancy, urine POC     Status: None   Collection Time: 01/26/18  6:47 PM  Result Value Ref Range   Preg Test, Ur NEGATIVE NEGATIVE   No results found.  ED Clinical Impression  Nausea vomiting and diarrhea  Urinary tract infection with hematuria, site unspecified   ED Assessment/Plan  She appears nontoxic, vitals normal, abdomen benign.  Fingerstick 98.  Is not a diabetic.  Will check a UA, U pregnant.  Will check a GI pathogen panel given duration of symptoms.  No C. difficile because she is reporting some soft, not completely liquid stools.  Will send home with Zofran and refer to GI if her pathogen panel comes back negative.  In the  differential is food allergy, IBS.  [facility] gastroenterology on call.  UA with hematuria, leuks.  She has  had urinary symptoms for the past week, so we will treat this as if this is UTI with Macrobid, Pyridium.  Will send urine off for culture to confirm presence or absence of UTI.  She was sent home with a stool sample kit.  States that she will bring back tomorrow.  Placed this as a future order.  Discussed labs, MDM, treatment plan, and plan for follow-up with patient. Discussed sn/sx that should prompt return to the ED. patient agrees with plan.   Meds ordered this encounter  Medications  . phenazopyridine (PYRIDIUM) 200 MG tablet    Sig: Take 1 tablet (200 mg total) by mouth 3 (three) times daily as needed for pain.    Dispense:  6 tablet  Refill:  0  . nitrofurantoin, macrocrystal-monohydrate, (MACROBID) 100 MG capsule    Sig: Take 1 capsule (100 mg total) by mouth 2 (two) times daily. X 5 days    Dispense:  10 capsule    Refill:  0  . DISCONTD: ondansetron (ZOFRAN ODT) 8 MG disintegrating tablet    Sig: 1/2- 1 tablet q 8 hr prn nausea, vomiting    Dispense:  20 tablet    Refill:  0  . ondansetron (ZOFRAN ODT) 8 MG disintegrating tablet    Sig: Take 1 tablet (8 mg total) by mouth every 8 (eight) hours as needed for nausea or vomiting.    Dispense:  20 tablet    Refill:  0    *This clinic note was created using [employer]. Therefore, there may be occasional mistakes despite careful proofreading.   ?   Melynda Ripple, MD 01/26/18 [phone]

## 2018-01-26 NOTE — Discharge Instructions (Signed)
Push electrolyte containing fluids such as Pedialyte and Gatorade.  Continue bland foods.  Try the Zofran.  This will help with nausea, vomiting, this should slow down the diarrhea.  Bring back the stool sample as soon as you can.  Follow the directions that were given to you.  Call here in several days to get your lab results.  If your labs are negative, you will need to follow-up with [facility] as soon as you can to identify the cause of your symptoms.  To the ER for fevers above 100.4, abdominal pain, blood in your stool, if you have not urinated in 12 hours, or for other concerns

## 2018-01-28 LAB — URINE CULTURE

## 2018-03-10 NOTE — Progress Notes (Signed)
Pt never returned with sample.

## 2018-04-15 ENCOUNTER — Ambulatory Visit (HOSPITAL_COMMUNITY)
Admission: EM | Admit: 2018-04-15 | Discharge: 2018-04-15 | Disposition: A | Discharge status: Home / Self Care | Payer: Worker's Compensation | Attending: Family Medicine | Admitting: Family Medicine

## 2018-04-15 ENCOUNTER — Encounter (HOSPITAL_COMMUNITY): Payer: Self-pay

## 2018-04-15 DIAGNOSIS — W010XXA Fall on same level from slipping, tripping and stumbling without subsequent striking against object, initial encounter: Secondary | ICD-10-CM

## 2018-04-15 DIAGNOSIS — M25571 Pain in right ankle and joints of right foot: Secondary | ICD-10-CM

## 2018-04-15 DIAGNOSIS — M25561 Pain in right knee: Secondary | ICD-10-CM | POA: Diagnosis not present

## 2018-04-15 DIAGNOSIS — S39002A Unspecified injury of muscle, fascia and tendon of lower back, initial encounter: Secondary | ICD-10-CM

## 2018-04-15 MED ORDER — CYCLOBENZAPRINE HCL 10 MG PO TABS: 10.0000 mg | ORAL_TABLET | Freq: Two times a day (BID) | ORAL | 0 refills | Status: DC | PRN

## 2018-04-15 MED ORDER — NAPROXEN 500 MG PO TABS: 500.0000 mg | ORAL_TABLET | Freq: Two times a day (BID) | ORAL | 0 refills | Status: DC

## 2018-04-15 NOTE — ED Provider Notes (Signed)
[facility]   [id number] 04/15/18 Arrival Time: 1703  CC: Back pain  SUBJECTIVE: History from: patient. Jill Odom is a 32 y.o. female complains of low back, right knee, and right ankle pain that began today.  States she was working in her normal capacity as a Merchandiser, retail at [employer], when she slipped and fell backwards onto some boxes.  Localizes back pain to left low back that shoots upwards to her mid back.  Knee pain to front of knee.  Right ankle pain diffuse.  States back pain is the worse.  Has not tried OTC medications.  Back pain made worse with bending forward.  Knee and ankle pain made worse with standing, but able to bear weight and ambulate.  Denies previous back injury in the past.  Denies fever, chills, erythema, ecchymosis, effusion, weakness, numbness and tingling, saddle paresthesias, urinary or bowel incontinence.      ROS: As per HPI.  Past Medical History:  Diagnosis Date  . Allergy   . Asthma   . GERD (gastroesophageal reflux disease)    Past Surgical History:  Procedure Laterality Date  . CESAREAN SECTION  12/06/2011   Procedure: CESAREAN SECTION;  Surgeon: Oliver Pila, MD;  Location: [facility];  Service: Gynecology;  Laterality: N/A;   Allergies  Allergen Reactions  . Caffeine Other (See Comments)    Itching and throat swelling.   No current facility-administered medications on file prior to encounter.    No current outpatient medications on file prior to encounter.   Social History   Socioeconomic History  . Marital status: Single    Spouse name: Not on file  . Number of children: 1  . Years of education: Not on file  . Highest education level: Not on file  Occupational History  . Occupation: Control and instrumentation engineer: [employer]  Social Needs  . Financial resource strain: Not on file  . Food insecurity:    Worry: Not on file    Inability: Not on file  . Transportation needs:    Medical: Not on file    Non-medical: Not on file  Tobacco  Use  . Smoking status: Never Smoker  . Smokeless tobacco: Never Used  Substance and Sexual Activity  . Alcohol use: Yes  . Drug use: No  . Sexual activity: Yes    Birth control/protection: Implant  Lifestyle  . Physical activity:    Days per week: Not on file    Minutes per session: Not on file  . Stress: Not on file  Relationships  . Social connections:    Talks on phone: Not on file    Gets together: Not on file    Attends religious service: Not on file    Active member of club or organization: Not on file    Attends meetings of clubs or organizations: Not on file    Relationship status: Not on file  . Intimate partner violence:    Fear of current or ex partner: Not on file    Emotionally abused: Not on file    Physically abused: Not on file    Forced sexual activity: Not on file  Other Topics Concern  . Not on file  Social History Narrative  . Not on file   Family History  Problem Relation Age of Onset  . Asthma Father   . Hypertension Mother   . Hypertension Sister   . Hypertension Maternal Grandmother   . Hypertension Maternal Grandfather   . Hypertension Paternal  Grandmother   . Hypertension Paternal Grandfather     OBJECTIVE:  Vitals:   04/15/18 1751 04/15/18 1754  BP: 107/72   Pulse: 80   Resp: 18   Temp: 98.4 F (36.9 C)   TempSrc: Oral   SpO2: 100%   Weight:  (!) 359 lb (162.8 kg)    General appearance: AOx3; in no acute distress; obese Head: NCAT Lungs: CTA bilaterally Heart: RRR.  Clear S1 and S2 without murmur, gallops, or rubs.  Radial pulses 2+ bilaterally. Musculoskeletal: Back, right knee and right ankle Inspection: Skin warm, dry, clear and intact without obvious erythema, effusion, or ecchymosis.  Palpation: Diffusely tender about the ankle, anterior knee and left paravertebral muscles ROM: FROM active and passive about the spine, ankle, and knee Strength: 5/5 shld abduction, 5/5 shld adduction, 5/5 elbow flexion, 5/5 elbow extension,  5/5 grip strength, 5/5 hip flexion, 5/5 knee abduction, 5/5 knee adduction, 5/5 knee flexion, 5/5 knee extension, 5/5 dorsiflexion Negative SLR Skin: warm and dry Neurologic: Ambulates without difficulty; Sensation intact about the upper/ lower extremities Psychological: alert and cooperative; normal mood and affect  ASSESSMENT & PLAN:  1. Injury of muscle of lower back   2. Acute pain of right knee   3. Acute right ankle pain    Meds ordered this encounter  Medications  . naproxen (NAPROSYN) 500 MG tablet    Sig: Take 1 tablet (500 mg total) by mouth 2 (two) times daily.    Dispense:  30 tablet    Refill:  0    Order Specific Question:   Supervising Provider    Answer:   Isa Rankin [phone]  . cyclobenzaprine (FLEXERIL) 10 MG tablet    Sig: Take 1 tablet (10 mg total) by mouth 2 (two) times daily as needed for muscle spasms.    Dispense:  20 tablet    Refill:  0    Order Specific Question:   Supervising Provider    Answer:   Isa Rankin [[id number]]   All injuries appear musculature in nature We do not perform urine drug screens at this facility  Continue conservative management of rest, ice, and gentle stretches Take naproxen as needed for pain relief (may cause abdominal discomfort, ulcers, and GI bleeds avoid taking with other NSAIDs) Take cyclobenzaprine at nighttime for symptomatic relief. Avoid driving or operating heavy machinery while using medication. Follow up with Occupational health for further evaluation and management of symptoms and for work restrictions Return or go to the ER if you have any new or worsening symptoms (fever, chills, chest pain, abdominal pain, changes in bowel or bladder habits, pain radiating into lower legs, etc...)   Reviewed expectations re: course of current medical issues. Questions answered. Outlined signs and symptoms indicating need for more acute intervention. Patient verbalized understanding. After Visit Summary  given.    Rennis Harding, PA-C 04/15/18 1920

## 2018-04-15 NOTE — Discharge Instructions (Addendum)
All injuries appear musculature in nature Ankle brace given We do not perform urine drug screens at this facility  Continue conservative management of rest, ice, and gentle stretches Take naproxen as needed for pain relief (may cause abdominal discomfort, ulcers, and GI bleeds avoid taking with other NSAIDs) Take cyclobenzaprine at nighttime for symptomatic relief. Avoid driving or operating heavy machinery while using medication. Follow up with Occupational health for further evaluation and management of symptoms and for work restrictions Return or go to the ER if you have any new or worsening symptoms (fever, chills, chest pain, abdominal pain, changes in bowel or bladder habits, pain radiating into lower legs, etc...)

## 2018-04-15 NOTE — ED Triage Notes (Addendum)
Pt slipped and fell on some boxes that were on the floor. Pt states she hurt her back. This happened at 10:30 this morning. Also right knee and ankle pain.

## 2018-06-21 ENCOUNTER — Ambulatory Visit (HOSPITAL_COMMUNITY)
Admission: EM | Admit: 2018-06-21 | Discharge: 2018-06-21 | Disposition: A | Discharge status: Home / Self Care | Payer: Self-pay | Attending: Family Medicine | Admitting: Family Medicine

## 2018-06-21 ENCOUNTER — Encounter (HOSPITAL_COMMUNITY): Payer: Self-pay

## 2018-06-21 DIAGNOSIS — Z3202 Encounter for pregnancy test, result negative: Secondary | ICD-10-CM

## 2018-06-21 DIAGNOSIS — R350 Frequency of micturition: Secondary | ICD-10-CM | POA: Insufficient documentation

## 2018-06-21 DIAGNOSIS — Z8249 Family history of ischemic heart disease and other diseases of the circulatory system: Secondary | ICD-10-CM | POA: Insufficient documentation

## 2018-06-21 DIAGNOSIS — M79641 Pain in right hand: Secondary | ICD-10-CM | POA: Insufficient documentation

## 2018-06-21 DIAGNOSIS — L02214 Cutaneous abscess of groin: Secondary | ICD-10-CM | POA: Insufficient documentation

## 2018-06-21 DIAGNOSIS — R202 Paresthesia of skin: Secondary | ICD-10-CM | POA: Insufficient documentation

## 2018-06-21 DIAGNOSIS — K219 Gastro-esophageal reflux disease without esophagitis: Secondary | ICD-10-CM | POA: Insufficient documentation

## 2018-06-21 DIAGNOSIS — Z791 Long term (current) use of non-steroidal anti-inflammatories (NSAID): Secondary | ICD-10-CM | POA: Insufficient documentation

## 2018-06-21 DIAGNOSIS — H5789 Other specified disorders of eye and adnexa: Secondary | ICD-10-CM | POA: Insufficient documentation

## 2018-06-21 DIAGNOSIS — R531 Weakness: Secondary | ICD-10-CM | POA: Insufficient documentation

## 2018-06-21 LAB — POCT URINALYSIS DIP (DEVICE)
BILIRUBIN URINE: NEGATIVE
Glucose, UA: NEGATIVE mg/dL
KETONES UR: NEGATIVE mg/dL
Nitrite: NEGATIVE
Protein, ur: NEGATIVE mg/dL
SPECIFIC GRAVITY, URINE: 1.025 (ref 1.005–1.030)
Urobilinogen, UA: 0.2 mg/dL (ref 0.0–1.0)
pH: 6.5 (ref 5.0–8.0)

## 2018-06-21 LAB — POCT PREGNANCY, URINE: Preg Test, Ur: NEGATIVE

## 2018-06-21 MED ORDER — TETRACAINE HCL 0.5 % OP SOLN
OPHTHALMIC | Status: AC
  Filled 2018-06-21: qty 4

## 2018-06-21 MED ORDER — ERYTHROMYCIN 5 MG/GM OP OINT: TOPICAL_OINTMENT | OPHTHALMIC | 0 refills | Status: DC

## 2018-06-21 MED ORDER — SULFAMETHOXAZOLE-TRIMETHOPRIM 800-160 MG PO TABS: 1.0000 | ORAL_TABLET | Freq: Two times a day (BID) | ORAL | 0 refills | Status: AC

## 2018-06-21 MED ORDER — PREDNISONE 50 MG PO TABS: 50.0000 mg | ORAL_TABLET | Freq: Every day | ORAL | 0 refills | Status: AC

## 2018-06-21 NOTE — ED Triage Notes (Signed)
Pt presents with right eye pain that is sensitive to light and draining; pt also has right hand pain, urinary tract symptoms and an abscess on outer part of left thigh.

## 2018-06-21 NOTE — Discharge Instructions (Addendum)
Eye Pain Please begin using erythromycin ointment 4-6 times a day Please follow-up with ophthalmology if symptoms persisting Please follow-up in emergency room if having worsening pain, change in vision, eye swelling  UTI Urine showed evidence of infection. We are treating you with Bactrim. Be sure to take full course. Stay hydrated- urine should be pale yellow to clear. My continue azo for relief of burning while infection is being cleared.   Please return or follow up with your primary provider if symptoms not improving with treatment. Please return sooner if you have worsening of symptoms or develop fever, nausea, vomiting, abdominal pain, back pain, lightheadedness, dizziness.  Abscess The medicine we are putting you on for UTI we will also treat the abscess Please apply warm compresses/soaks to further help soften up and express drainage from abscess  Hand Pain If symptoms seem to be related to nerve irritation causing numbness and tingling/weakness Please take prednisone daily in morning with food on stomach for the next 5 days Follow-up if symptoms not resolving, worsening, becoming more frequent, developing increased weakness

## 2018-06-21 NOTE — ED Provider Notes (Signed)
[facility]    CSN: [id number] Arrival date & time: 06/21/18  0806     History   Chief Complaint Chief Complaint  Patient presents with  . Eye Problem    Right  . Hand Pain    Right  . Urinary Tract Infection  . Abscess    Left Thigh    HPI Jill Odom is a 32 y.o. female history of allergies, asthma, presenting today for evaluation multiple complaints.  Patient states that she has had right eye redness, irritation and watering.  This is been going on for the past 2 days.  She denies vision changes.  She denies eye swelling.  She denies contact use.  She has not tried anything over-the-counter.  Denies associated URI symptoms.  Patient also is concerned about her right hand.  States that she fell on this about a month ago.  Since she has had occasional numbness and tingling and weakness.  Is often difficult for her to sleep as she wakes up with tingling in her hands.  Occasionally she will drop items due to sensation of weakness.  Denies difficulty moving fingers or hand.  She has not tried anything for symptoms.  Patient is right-handed.  Symptoms will come randomly, not associated with specific movements.  Patient also concerned about UTI.  Notes that she has had increased frequency and pressure to her lower abdomen.  Denies dysuria.  Denies abnormal vaginal discharge, itching or irritation.  Denies fever, nausea, vomiting or back pain.  States that this feels like the beginning stages of a UTI.  Patient has also had abscess to her left groin/thigh.  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  This is been present for the past week.  Has noted some bloody drainage out of 1 of the abscesses.  She has been applying cream without relief.   HPI  Past Medical History:  Diagnosis Date  . Allergy   . Asthma   . GERD (gastroesophageal reflux disease)     Patient Active Problem List   Diagnosis Date Noted  . Asthma 12/25/2011  .  Shortness of breath 12/12/2011  . Anemia 12/12/2011    Past Surgical History:  Procedure Laterality Date  . CESAREAN SECTION  12/06/2011   Procedure: CESAREAN SECTION;  Surgeon: Oliver Pila, MD;  Location: [facility];  Service: Gynecology;  Laterality: N/A;    OB History    Gravida  1   Para  1   Term  1   Preterm  0   AB  0   Living  1     SAB  0   TAB  0   Ectopic  0   Multiple  0   Live Births  1            Home Medications    Prior to Admission medications   Medication Sig Start Date End Date Taking? Authorizing Provider  cyclobenzaprine (FLEXERIL) 10 MG tablet Take 1 tablet (10 mg total) by mouth 2 (two) times daily as needed for muscle spasms. 04/15/18   Wurst, [place], PA-C  erythromycin ophthalmic ointment Place a 1/2 inch ribbon of ointment into the lower eyelid 4-6 times per day 06/21/18   Wieters, Hallie C, PA-C  naproxen (NAPROSYN) 500 MG tablet Take 1 tablet (500 mg total) by mouth 2 (two) times daily. 04/15/18   Wurst, [place], PA-C  predniSONE (DELTASONE) 50 MG tablet Take 1 tablet (50 mg  total) by mouth daily for 5 days. 06/21/18 06/26/18  Wieters, Hallie C, PA-C  sulfamethoxazole-trimethoprim (BACTRIM DS,SEPTRA DS) 800-160 MG tablet Take 1 tablet by mouth 2 (two) times daily for 7 days. 06/21/18 06/28/18  Wieters, Junius CreamerHallie C, PA-C    Family History Family History  Problem Relation Age of Onset  . Asthma Father   . Hypertension Mother   . Hypertension Sister   . Hypertension Maternal Grandmother   . Hypertension Maternal Grandfather   . Hypertension Paternal Grandmother   . Hypertension Paternal Grandfather     Social History Social History   Tobacco Use  . Smoking status: Never Smoker  . Smokeless tobacco: Never Used  Substance Use Topics  . Alcohol use: Yes  . Drug use: No     Allergies   Caffeine   Review of Systems Review of Systems  Constitutional: Negative for fatigue and fever.  HENT: Negative for congestion,  mouth sores and rhinorrhea.   Eyes: Positive for pain, discharge and redness. Negative for photophobia, itching and visual disturbance.  Respiratory: Negative for shortness of breath.   Cardiovascular: Negative for chest pain.  Gastrointestinal: Positive for abdominal pain. Negative for diarrhea, nausea and vomiting.  Genitourinary: Positive for frequency. Negative for dysuria, flank pain, genital sores, hematuria, menstrual problem, vaginal bleeding, vaginal discharge and vaginal pain.  Musculoskeletal: Negative for arthralgias, back pain and joint swelling.  Skin: Positive for color change and wound. Negative for rash.  Neurological: Negative for dizziness, weakness, light-headedness and headaches.     Physical Exam Triage Vital Signs ED Triage Vitals  Enc Vitals Group     BP 06/21/18 0819 (!) 149/88     Pulse Rate 06/21/18 0819 85     Resp 06/21/18 0819 16     Temp 06/21/18 0819 98 F (36.7 C)     Temp Source 06/21/18 0819 Oral     SpO2 06/21/18 0819 100 %     Weight --      Height --      Head Circumference --      Peak Flow --      Pain Score 06/21/18 0824 7     Pain Loc --      Pain Edu? --      Excl. in GC? --    No data found.  Updated Vital Signs BP (!) 149/88 (BP Location: Right Arm)   Pulse 85   Temp 98 F (36.7 C) (Oral)   Resp 16   SpO2 100%   Visual Acuity Right Eye Distance: 20/50 Left Eye Distance: 20/40 Bilateral Distance: 20/40  Right Eye Near:   Left Eye Near:    Bilateral Near:     Physical Exam Vitals signs and nursing note reviewed.  Constitutional:      General: She is not in acute distress.    Appearance: She is well-developed.  HENT:     Head: Normocephalic and atraumatic.  Eyes:     Extraocular Movements: Extraocular movements intact.     Conjunctiva/sclera: Conjunctivae normal.     Pupils: Pupils are equal, round, and reactive to light.     Comments: No periorbital swelling, no photophobia with exam, red eye with small bump with  surrounding erythema, no fluorescein uptake; conjunctival erythema  Neck:     Musculoskeletal: Neck supple.  Cardiovascular:     Rate and Rhythm: Normal rate and regular rhythm.     Heart sounds: No murmur.  Pulmonary:     Effort: Pulmonary effort is normal. No  respiratory distress.     Breath sounds: Normal breath sounds.     Comments: Breathing comfortably at rest, CTABL, no wheezing, rales or other adventitious sounds auscultated Abdominal:     Palpations: Abdomen is soft.     Tenderness: There is abdominal tenderness.     Comments: Mild suprapubic tenderness  Musculoskeletal:     Comments: Full active range of motion of right wrist and fingers, no overlying swelling or erythema, negative Tinel's, negative Finkelstein's  Radial pulse 2+  Grip strength 5/5 and equal bilaterally  Skin:    General: Skin is warm and dry.     Comments: Multiple small areas of induration to bilateral medial thighs, various stages of healing, 1 with central opening, appears to be draining bloody drainage.  Neurological:     Mental Status: She is alert.        UC Treatments / Results  Labs (all labs ordered are listed, but only abnormal results are displayed) Labs Reviewed  POCT URINALYSIS DIP (DEVICE) - Abnormal; Notable for the following components:      Result Value   Hgb urine dipstick SMALL (*)    Leukocytes, UA MODERATE (*)    All other components within normal limits  URINE CULTURE  POC URINE PREG, ED  POCT PREGNANCY, URINE    EKG None  Radiology No results found.  Procedures Procedures (including critical care time)  Medications Ordered in UC Medications - No data to display  Initial Impression / Assessment and Plan / UC Course  I have reviewed the triage vital signs and the nursing notes.  Pertinent labs & imaging results that were available during my care of the patient were reviewed by me and considered in my medical decision making (see chart for details).      Moderate leuks on UA, will treat for UTI, will use Bactrim to also treat for abscesses.  Will provide 1 week's worth.  Urine culture sent.  Lesions do not warrant drainage at this time.  Warm compresses, continue to monitor.  Other lesion has been present for a while, with increased erythema, no fluorescein uptake.  Will provide erythromycin ointment to cover for underlying infection contributing to discomfort, otherwise recommending Naphcon-A over-the-counter symptom relief.  Visual acuity intact.  Patient to go to emergency room if developing worsening pain or changes in vision or worsening redness.  Follow-up with ophthalmology.  Patient likely with some form of nerve inflammation causing symptoms in right hand.  Possible cubital tunnel, given symptoms at nighttime.  Patient cannot correlate symptoms to specific fingers.  Will treat with course of prednisone, continue to monitor.  Strength intact.  Do not suspect underlying fracture.  Discussed strict return precautions. Patient verbalized understanding and is agreeable with plan.  Final Clinical Impressions(s) / UC Diagnoses   Final diagnoses:  Redness of right eye  Urinary frequency  Right hand paresthesia  Groin abscess     Discharge Instructions     Eye Pain Please begin using erythromycin ointment 4-6 times a day Please follow-up with ophthalmology if symptoms persisting Please follow-up in emergency room if having worsening pain, change in vision, eye swelling  UTI Urine showed evidence of infection. We are treating you with Bactrim. Be sure to take full course. Stay hydrated- urine should be pale yellow to clear. My continue azo for relief of burning while infection is being cleared.   Please return or follow up with your primary provider if symptoms not improving with treatment. Please return sooner if you  have worsening of symptoms or develop fever, nausea, vomiting, abdominal pain, back pain, lightheadedness,  dizziness.  Abscess The medicine we are putting you on for UTI we will also treat the abscess Please apply warm compresses/soaks to further help soften up and express drainage from abscess  Hand Pain If symptoms seem to be related to nerve irritation causing numbness and tingling/weakness Please take prednisone daily in morning with food on stomach for the next 5 days Follow-up if symptoms not resolving, worsening, becoming more frequent, developing increased weakness     ED Prescriptions    Medication Sig Dispense Auth. Provider   sulfamethoxazole-trimethoprim (BACTRIM DS,SEPTRA DS) 800-160 MG tablet Take 1 tablet by mouth 2 (two) times daily for 7 days. 14 tablet Wieters, Hallie C, PA-C   predniSONE (DELTASONE) 50 MG tablet Take 1 tablet (50 mg total) by mouth daily for 5 days. 5 tablet Wieters, Hallie C, PA-C   erythromycin ophthalmic ointment  (Status: Discontinued) Place a 1/2 inch ribbon of ointment into the lower eyelid. 3.5 g Wieters, Hallie C, PA-C   erythromycin ophthalmic ointment Place a 1/2 inch ribbon of ointment into the lower eyelid 4-6 times per day 3.5 g Wieters, Clifton C, PA-C     Controlled Substance Prescriptions [place] Controlled Substance Registry consulted? Not Applicable   Lew Dawes, [place] 06/21/18 1412

## 2018-06-23 LAB — URINE CULTURE

## 2018-06-26 ENCOUNTER — Telehealth (HOSPITAL_COMMUNITY): Payer: Self-pay | Admitting: Emergency Medicine

## 2018-06-26 NOTE — Telephone Encounter (Signed)
Urine culture was positive for PROTEUS MIRABILIS and was given bactrim  at urgent care visit. Pt contacted and made aware, educated on completing antibiotic and to follow up if symptoms are persistent. Verbalized understanding.    

## 2018-08-07 ENCOUNTER — Encounter (HOSPITAL_COMMUNITY): Payer: Self-pay

## 2018-08-07 ENCOUNTER — Other Ambulatory Visit: Payer: Self-pay

## 2018-08-07 ENCOUNTER — Ambulatory Visit (HOSPITAL_COMMUNITY)
Admission: EM | Admit: 2018-08-07 | Discharge: 2018-08-07 | Disposition: A | Discharge status: Home / Self Care | Payer: Medicaid Other | Attending: Family Medicine | Admitting: Family Medicine

## 2018-08-07 DIAGNOSIS — L0291 Cutaneous abscess, unspecified: Secondary | ICD-10-CM | POA: Insufficient documentation

## 2018-08-07 DIAGNOSIS — L02212 Cutaneous abscess of back [any part, except buttock]: Secondary | ICD-10-CM

## 2018-08-07 MED ORDER — MUPIROCIN CALCIUM 2 % EX CREA: 1.0000 "application " | TOPICAL_CREAM | Freq: Two times a day (BID) | CUTANEOUS | 0 refills | Status: DC

## 2018-08-07 MED ORDER — CHLORHEXIDINE GLUCONATE 4 % EX LIQD: Freq: Every day | CUTANEOUS | 0 refills | Status: DC | PRN

## 2018-08-07 NOTE — Discharge Instructions (Addendum)
We are sending the swab for testing.  For now you can start with the Hibiclens wash.  You can do this a few times a week and then back off to a few times a month.  Bactroban for the open wound, 2 times a day.  We will call you with your results.

## 2018-08-07 NOTE — ED Triage Notes (Signed)
Pt cc she has a boil on her left side x 1 week. Pt states that it has burst but the wound is not healing.

## 2018-08-09 NOTE — ED Provider Notes (Signed)
[facility]    CSN: [id number] Arrival date & time: 08/07/18  1433     History   Chief Complaint Chief Complaint  Patient presents with  . Abscess    HPI Jill Odom is a 33 y.o. female.   Pt is a 33 year old female that presents today for abscess to the left mid  back. This has been there x 1 week. The wound opened up on its own and drained. The wound is still open. She has had multiple re occurrence of these for many years. She is worried because the wound has not healed. No fever, chills, myalgias. Denies any hx of MRSA.   ROS per HPI      Past Medical History:  Diagnosis Date  . Allergy   . Asthma   . GERD (gastroesophageal reflux disease)     Patient Active Problem List   Diagnosis Date Noted  . Asthma 12/25/2011  . Shortness of breath 12/12/2011  . Anemia 12/12/2011    Past Surgical History:  Procedure Laterality Date  . CESAREAN SECTION  12/06/2011   Procedure: CESAREAN SECTION;  Surgeon: Oliver Pila, MD;  Location: [facility];  Service: Gynecology;  Laterality: N/A;    OB History    Gravida  1   Para  1   Term  1   Preterm  0   AB  0   Living  1     SAB  0   TAB  0   Ectopic  0   Multiple  0   Live Births  1            Home Medications    Prior to Admission medications   Medication Sig Start Date End Date Taking? Authorizing Provider  chlorhexidine (HIBICLENS) 4 % external liquid Apply topically daily as needed. 08/07/18   Dahlia Byes A, NP  cyclobenzaprine (FLEXERIL) 10 MG tablet Take 1 tablet (10 mg total) by mouth 2 (two) times daily as needed for muscle spasms. 04/15/18   Wurst, [place], PA-C  erythromycin ophthalmic ointment Place a 1/2 inch ribbon of ointment into the lower eyelid 4-6 times per day 06/21/18   Wieters, Hallie C, PA-C  mupirocin cream (BACTROBAN) 2 % Apply 1 application topically 2 (two) times daily. 08/07/18   Dahlia Byes A, NP  naproxen (NAPROSYN) 500 MG tablet Take 1 tablet (500 mg total)  by mouth 2 (two) times daily. 04/15/18   Rennis Harding, PA-C    Family History Family History  Problem Relation Age of Onset  . Asthma Father   . Hypertension Mother   . Hypertension Sister   . Hypertension Maternal Grandmother   . Hypertension Maternal Grandfather   . Hypertension Paternal Grandmother   . Hypertension Paternal Grandfather     Social History Social History   Tobacco Use  . Smoking status: Never Smoker  . Smokeless tobacco: Never Used  Substance Use Topics  . Alcohol use: Yes  . Drug use: No     Allergies   Caffeine   Review of Systems Review of Systems   Physical Exam Triage Vital Signs ED Triage Vitals  Enc Vitals Group     BP 08/07/18 1533 136/69     Pulse Rate 08/07/18 1533 79     Resp 08/07/18 1533 18     Temp 08/07/18 1533 97.8 F (36.6 C)     Temp Source 08/07/18 1533 Oral     SpO2 08/07/18 1533 100 %  Weight 08/07/18 1535 285 lb (129.3 kg)     Height --      Head Circumference --      Peak Flow --      Pain Score 08/07/18 1535 0     Pain Loc --      Pain Edu? --      Excl. in GC? --    No data found.  Updated Vital Signs BP 136/69 (BP Location: Right Arm)   Pulse 79   Temp 97.8 F (36.6 C) (Oral)   Resp 18   Wt 285 lb (129.3 kg)   LMP 08/07/2018   SpO2 100%   BMI 50.49 kg/m   Visual Acuity Right Eye Distance:   Left Eye Distance:   Bilateral Distance:    Right Eye Near:   Left Eye Near:    Bilateral Near:     Physical Exam Vitals signs and nursing note reviewed.  Constitutional:      General: She is not in acute distress.    Appearance: She is well-developed. She is obese.  HENT:     Head: Normocephalic and atraumatic.     Nose: Nose normal.  Eyes:     Conjunctiva/sclera: Conjunctivae normal.  Neck:     Musculoskeletal: Normal range of motion.  Pulmonary:     Effort: Pulmonary effort is normal. No respiratory distress.  Musculoskeletal: Normal range of motion.  Skin:    General: Skin is warm and  dry.          Comments: Open wound measuring approximately 1 cm with mild redness and tender to touch. No drainage.  No fluctuance or induration.  Wound is in skin fold.   Neurological:     Mental Status: She is alert.  Psychiatric:        Mood and Affect: Mood normal.      UC Treatments / Results  Labs (all labs ordered are listed, but only abnormal results are displayed) Labs Reviewed  AEROBIC CULTURE (SUPERFICIAL SPECIMEN)    EKG None  Radiology No results found.  Procedures Procedures (including critical care time)  Medications Ordered in UC Medications - No data to display  Initial Impression / Assessment and Plan / UC Course  I have reviewed the triage vital signs and the nursing notes.  Pertinent labs & imaging results that were available during my care of the patient were reviewed by me and considered in my medical decision making (see chart for details).     Pt has been having these re occurrence of abscesses over the past few years.  I feel it is appropriate to swab for MRSA.  Will give her some Bactroban cream for the wounds and have her wash in Hibiclens a few times a week for now.  Follow up as needed for continued or worsening symptoms   Final Clinical Impressions(s) / UC Diagnoses   Final diagnoses:  Abscess     Discharge Instructions     We are sending the swab for testing.  For now you can start with the Hibiclens wash.  You can do this a few times a week and then back off to a few times a month.  Bactroban for the open wound, 2 times a day.  We will call you with your results.     ED Prescriptions    Medication Sig Dispense Auth. Provider   mupirocin cream (BACTROBAN) 2 % Apply 1 application topically 2 (two) times daily. 15 g Janace Aris, NP  chlorhexidine (HIBICLENS) 4 % external liquid Apply topically daily as needed. 120 mL Janace ArisBast, Shahara Hartsfield A, NP     Controlled Substance Prescriptions [place] Controlled Substance Registry  consulted? no   Janace ArisBast, Marguriete Wootan A, NP 08/09/18 [phone]

## 2018-08-10 LAB — AEROBIC CULTURE  (SUPERFICIAL SPECIMEN)

## 2018-08-10 LAB — AEROBIC CULTURE W GRAM STAIN (SUPERFICIAL SPECIMEN)

## 2019-04-10 ENCOUNTER — Other Ambulatory Visit: Payer: Self-pay

## 2019-04-10 DIAGNOSIS — Z20822 Contact with and (suspected) exposure to covid-19: Secondary | ICD-10-CM

## 2019-04-13 LAB — NOVEL CORONAVIRUS, NAA: SARS-CoV-2, NAA: NOT DETECTED

## 2019-06-08 ENCOUNTER — Other Ambulatory Visit: Payer: Self-pay

## 2019-06-08 DIAGNOSIS — Z20822 Contact with and (suspected) exposure to covid-19: Secondary | ICD-10-CM

## 2019-06-11 LAB — NOVEL CORONAVIRUS, NAA: SARS-CoV-2, NAA: NOT DETECTED

## 2020-11-21 ENCOUNTER — Emergency Department (HOSPITAL_BASED_OUTPATIENT_CLINIC_OR_DEPARTMENT_OTHER)
Admission: EM | Admit: 2020-11-21 | Discharge: 2020-11-21 | Disposition: A | Discharge status: Home / Self Care | Payer: BC Managed Care – PPO | Attending: Emergency Medicine | Admitting: Emergency Medicine

## 2020-11-21 ENCOUNTER — Other Ambulatory Visit: Payer: Self-pay

## 2020-11-21 ENCOUNTER — Encounter (HOSPITAL_BASED_OUTPATIENT_CLINIC_OR_DEPARTMENT_OTHER): Payer: Self-pay | Admitting: *Deleted

## 2020-11-21 ENCOUNTER — Emergency Department (HOSPITAL_BASED_OUTPATIENT_CLINIC_OR_DEPARTMENT_OTHER): Payer: BC Managed Care – PPO

## 2020-11-21 DIAGNOSIS — J45909 Unspecified asthma, uncomplicated: Secondary | ICD-10-CM | POA: Diagnosis not present

## 2020-11-21 DIAGNOSIS — R079 Chest pain, unspecified: Secondary | ICD-10-CM | POA: Insufficient documentation

## 2020-11-21 LAB — CBC WITH DIFFERENTIAL/PLATELET
Abs Immature Granulocytes: 0.04 10*3/uL (ref 0.00–0.07)
Basophils Absolute: 0 10*3/uL (ref 0.0–0.1)
Basophils Relative: 0 %
Eosinophils Absolute: 0.1 10*3/uL (ref 0.0–0.5)
Eosinophils Relative: 1 %
HCT: 33.8 % — ABNORMAL LOW (ref 36.0–46.0)
Hemoglobin: 10.2 g/dL — ABNORMAL LOW (ref 12.0–15.0)
Immature Granulocytes: 0 %
Lymphocytes Relative: 31 %
Lymphs Abs: 3 10*3/uL (ref 0.7–4.0)
MCH: 25.8 pg — ABNORMAL LOW (ref 26.0–34.0)
MCHC: 30.2 g/dL (ref 30.0–36.0)
MCV: 85.4 fL (ref 80.0–100.0)
Monocytes Absolute: 0.7 10*3/uL (ref 0.1–1.0)
Monocytes Relative: 8 %
Neutro Abs: 5.8 10*3/uL (ref 1.7–7.7)
Neutrophils Relative %: 60 %
Platelets: 363 10*3/uL (ref 150–400)
RBC: 3.96 MIL/uL (ref 3.87–5.11)
RDW: 15.8 % — ABNORMAL HIGH (ref 11.5–15.5)
WBC: 9.8 10*3/uL (ref 4.0–10.5)
nRBC: 0 % (ref 0.0–0.2)

## 2020-11-21 LAB — COMPREHENSIVE METABOLIC PANEL
ALT: 14 U/L (ref 0–44)
AST: 19 U/L (ref 15–41)
Albumin: 3.3 g/dL — ABNORMAL LOW (ref 3.5–5.0)
Alkaline Phosphatase: 78 U/L (ref 38–126)
Anion gap: 7 (ref 5–15)
BUN: 10 mg/dL (ref 6–20)
CO2: 24 mmol/L (ref 22–32)
Calcium: 10.4 mg/dL — ABNORMAL HIGH (ref 8.9–10.3)
Chloride: 104 mmol/L (ref 98–111)
Creatinine, Ser: 0.7 mg/dL (ref 0.44–1.00)
GFR, Estimated: >60 mL/min (ref >60)
Glucose, Bld: 114 mg/dL — ABNORMAL HIGH (ref 70–99)
Potassium: 3.7 mmol/L (ref 3.5–5.1)
Sodium: 135 mmol/L (ref 135–145)
Total Bilirubin: 0.3 mg/dL (ref 0.3–1.2)
Total Protein: 8.5 g/dL — ABNORMAL HIGH (ref 6.5–8.1)

## 2020-11-21 LAB — TROPONIN I (HIGH SENSITIVITY): Troponin I (High Sensitivity): <2 ng/L (ref <18)

## 2020-11-21 NOTE — Discharge Instructions (Signed)
Call your primary care doctor or specialist as discussed in the next 2-3 days.   Return immediately back to the ER if:  Your symptoms worsen within the next 12-24 hours. You develop new symptoms such as new fevers, persistent vomiting, new pain, shortness of breath, or new weakness or numbness, or if you have any other concerns.  

## 2020-11-21 NOTE — ED Triage Notes (Signed)
C/o right sided chest pain with SOb and nausea x 3 days

## 2020-11-21 NOTE — ED Provider Notes (Signed)
[facility] EMERGENCY DEPARTMENT Provider Note   CSN: [id number] Arrival date & time: 11/21/20  0023     History Chief Complaint  Patient presents with  . Chest Pain    Jill Odom is a 35 y.o. female.  Patient presents chief complaint of chest pain.  Describes it as mid to right-sided chest pain achy in nature otherwise nonradiating.  She states that last about 5 minutes at a time and currently is gone.  She states it has been bothering her for the past 3 days intermittently.  She otherwise has had a mild cough, denies fevers or vomiting or diarrhea.        Past Medical History:  Diagnosis Date  . Allergy   . Asthma   . GERD (gastroesophageal reflux disease)     Patient Active Problem List   Diagnosis Date Noted  . Asthma 12/25/2011  . Shortness of breath 12/12/2011  . Anemia 12/12/2011    Past Surgical History:  Procedure Laterality Date  . CESAREAN SECTION  12/06/2011   Procedure: CESAREAN SECTION;  Surgeon: Oliver Pila, MD;  Location: [facility];  Service: Gynecology;  Laterality: N/A;     OB History    Gravida  1   Para  1   Term  1   Preterm  0   AB  0   Living  1     SAB  0   IAB  0   Ectopic  0   Multiple  0   Live Births  1           Family History  Problem Relation Age of Onset  . Asthma Father   . Hypertension Mother   . Hypertension Sister   . Hypertension Maternal Grandmother   . Hypertension Maternal Grandfather   . Hypertension Paternal Grandmother   . Hypertension Paternal Grandfather     Social History   Tobacco Use  . Smoking status: Never Smoker  . Smokeless tobacco: Never Used  Substance Use Topics  . Alcohol use: Yes  . Drug use: No    Home Medications Prior to Admission medications   Medication Sig Start Date End Date Taking? Authorizing Provider  chlorhexidine (HIBICLENS) 4 % external liquid Apply topically daily as needed. 08/07/18   Dahlia Byes A, NP  cyclobenzaprine (FLEXERIL) 10 MG  tablet Take 1 tablet (10 mg total) by mouth 2 (two) times daily as needed for muscle spasms. 04/15/18   Wurst, [place], PA-C  erythromycin ophthalmic ointment Place a 1/2 inch ribbon of ointment into the lower eyelid 4-6 times per day 06/21/18   Wieters, Hallie C, PA-C  HUMIRA PEN 40 MG/0.8ML PNKT SMARTSIG:1 Pre-Filled Pen Syringe SUB-Q Once a Week 10/28/20   [provider]  mupirocin cream (BACTROBAN) 2 % Apply 1 application topically 2 (two) times daily. 08/07/18   Dahlia Byes A, NP  naproxen (NAPROSYN) 500 MG tablet Take 1 tablet (500 mg total) by mouth 2 (two) times daily. 04/15/18   Wurst, [place], PA-C    Allergies    Caffeine  Review of Systems   Review of Systems  Constitutional: Negative for fever.  HENT: Negative for ear pain.   Eyes: Negative for pain.  Respiratory: Positive for cough.   Cardiovascular: Positive for chest pain.  Gastrointestinal: Negative for abdominal pain.  Genitourinary: Negative for flank pain.  Musculoskeletal: Negative for back pain.  Skin: Negative for rash.  Neurological: Negative for headaches.    Physical Exam Updated Vital Signs BP Marland Kitchen)  144/94 (BP Location: Left Arm)   Pulse 94   Temp 98.4 F (36.9 C) (Oral)   Resp 20   Ht 5\' 4"  (1.626 m)   Wt (!) 166.9 kg   SpO2 100%   BMI 63.17 kg/m   Physical Exam Constitutional:      General: She is not in acute distress.    Appearance: Normal appearance.  HENT:     Head: Normocephalic.     Nose: Nose normal.  Eyes:     Extraocular Movements: Extraocular movements intact.  Cardiovascular:     Rate and Rhythm: Normal rate.  Pulmonary:     Effort: Pulmonary effort is normal.  Musculoskeletal:        General: Normal range of motion.     Cervical back: Normal range of motion.  Neurological:     General: No focal deficit present.     Mental Status: She is alert. Mental status is at baseline.     ED Results / Procedures / Treatments   Labs (all labs ordered are listed, but only  abnormal results are displayed) Labs Reviewed  CBC WITH DIFFERENTIAL/PLATELET - Abnormal; Notable for the following components:      Result Value   Hemoglobin 10.2 (*)    HCT 33.8 (*)    MCH 25.8 (*)    RDW 15.8 (*)    All other components within normal limits  COMPREHENSIVE METABOLIC PANEL - Abnormal; Notable for the following components:   Glucose, Bld 114 (*)    Calcium 10.4 (*)    Total Protein 8.5 (*)    Albumin 3.3 (*)    All other components within normal limits  TROPONIN I (HIGH SENSITIVITY)    EKG EKG Interpretation  Date/Time:  Friday Nov 21 2020 00:34:22 EDT Ventricular Rate:  94 PR Interval:  136 QRS Duration: 76 QT Interval:  332 QTC Calculation: 415 R Axis:   71 Text Interpretation: Normal sinus rhythm Nonspecific T wave abnormality Abnormal ECG Confirmed by 03-19-1991 ([id number]) on 11/21/2020 3:13:50 AM   Radiology DG Chest 2 View  Result Date: 11/21/2020 CLINICAL DATA:  Right-sided chest pain shortness of breath and nausea x3 days EXAM: CHEST - 2 VIEW COMPARISON:  September 17, 2016 FINDINGS: The heart size and mediastinal contours are within normal limits. Similar interstitial perihilar opacities. No focal consolidation. No pleural effusion. No pneumothorax. The visualized skeletal structures are unremarkable. IMPRESSION: No focal consolidation. Electronically Signed   By: September 19, 2016 MD   On: 11/21/2020 01:19    Procedures Procedures   Medications Ordered in ED Medications - No data to display  ED Course  I have reviewed the triage vital signs and the nursing notes.  Pertinent labs & imaging results that were available during my care of the patient were reviewed by me and considered in my medical decision making (see chart for details).    MDM Rules/Calculators/A&P                          EKG is unremarkable sinus rhythm no ST elevations or depressions are noted.  Rate is normal.  Labs otherwise negative, troponin is negative.  Chest x-ray shows no  acute findings per radiologist.  Patient is otherwise comfortable appearing with no pain or discomfort at this time.  Etiology of her symptoms are unclear I doubt pulmonary embolism given no leg swelling no peripheral edema noted.    I doubt acute coronary syndrome given negative work-up as well.  Recommended close follow-up with her doctors in 2 or 3 days, recommending immediate return for worsening pain or any additional concerns.   Final Clinical Impression(s) / ED Diagnoses Final diagnoses:  Chest pain, unspecified type    Rx / DC Orders ED Discharge Orders    None       Cheryll Cockayne, MD 11/21/20 [phone]

## 2020-11-21 NOTE — ED Notes (Signed)
Patient transported to X-ray 

## 2021-03-03 ENCOUNTER — Emergency Department (HOSPITAL_BASED_OUTPATIENT_CLINIC_OR_DEPARTMENT_OTHER)
Admission: EM | Admit: 2021-03-03 | Discharge: 2021-03-03 | Disposition: A | Discharge status: Home / Self Care | Payer: BC Managed Care – PPO | Attending: Emergency Medicine | Admitting: Emergency Medicine

## 2021-03-03 ENCOUNTER — Encounter (HOSPITAL_BASED_OUTPATIENT_CLINIC_OR_DEPARTMENT_OTHER): Payer: Self-pay | Admitting: Emergency Medicine

## 2021-03-03 ENCOUNTER — Other Ambulatory Visit: Payer: Self-pay

## 2021-03-03 DIAGNOSIS — Z8616 Personal history of COVID-19: Secondary | ICD-10-CM | POA: Diagnosis not present

## 2021-03-03 DIAGNOSIS — J45909 Unspecified asthma, uncomplicated: Secondary | ICD-10-CM | POA: Diagnosis not present

## 2021-03-03 DIAGNOSIS — R059 Cough, unspecified: Secondary | ICD-10-CM | POA: Diagnosis not present

## 2021-03-03 DIAGNOSIS — L03011 Cellulitis of right finger: Secondary | ICD-10-CM | POA: Diagnosis not present

## 2021-03-03 DIAGNOSIS — M79641 Pain in right hand: Secondary | ICD-10-CM | POA: Diagnosis present

## 2021-03-03 DIAGNOSIS — L089 Local infection of the skin and subcutaneous tissue, unspecified: Secondary | ICD-10-CM

## 2021-03-03 MED ORDER — BENZONATATE 200 MG PO CAPS: 200.0000 mg | ORAL_CAPSULE | Freq: Three times a day (TID) | ORAL | 0 refills | Status: AC

## 2021-03-03 MED ORDER — CEPHALEXIN 500 MG PO CAPS: 500.0000 mg | ORAL_CAPSULE | Freq: Three times a day (TID) | ORAL | 0 refills | Status: AC

## 2021-03-03 NOTE — Discharge Instructions (Addendum)
Recommend warm epsom salt water soaks for finger. Take Keflex as prescribed and complete the full course. Recheck with your PCP if not improving. Take Tessalon as needed as prescribed for cough. Also recommend Zyrtec and Flonase. Add Coricidin HBP if needed for additional symptom relief. Your blood pressure is elevated today, advise against Sudafed products.

## 2021-03-03 NOTE — ED Triage Notes (Addendum)
Pt arrive spov with c/o R index finger pain after pulling skin from side cuticle x1 week. Pt c/o pain and swelling, denies fever. Pt also c/o cough that pt reprts intermittent since May

## 2021-03-03 NOTE — ED Provider Notes (Signed)
[facility] EMERGENCY DEPARTMENT Provider Note   CSN: [id number] Arrival date & time: 03/03/21  1801     History Chief Complaint  Patient presents with   Hand Pain    Jill Odom is a 35 y.o. female.  35 yo female with complaint of pain in her distal right index finger after pulling a cuticle 1 week ago. Patient has been keeping finger clean but has progressively worsening pain/swelling. Patient is right hand dominant, able to move finger without difficulty. Also reports cough x 2 weeks, off and on since having COVID in May without fevers, chills or other sick symptoms. Has tried OTC cold medications without resolution.       Past Medical History:  Diagnosis Date   Allergy    Asthma    GERD (gastroesophageal reflux disease)     Patient Active Problem List   Diagnosis Date Noted   Asthma 12/25/2011   Shortness of breath 12/12/2011   Anemia 12/12/2011    Past Surgical History:  Procedure Laterality Date   CESAREAN SECTION  12/06/2011   Procedure: CESAREAN SECTION;  Surgeon: Oliver Pila, MD;  Location: [facility];  Service: Gynecology;  Laterality: N/A;     OB History     Gravida  1   Para  1   Term  1   Preterm  0   AB  0   Living  1      SAB  0   IAB  0   Ectopic  0   Multiple  0   Live Births  1           Family History  Problem Relation Age of Onset   Asthma Father    Hypertension Mother    Hypertension Sister    Hypertension Maternal Grandmother    Hypertension Maternal Grandfather    Hypertension Paternal Grandmother    Hypertension Paternal Grandfather     Social History   Tobacco Use   Smoking status: Never   Smokeless tobacco: Never  Substance Use Topics   Alcohol use: Yes   Drug use: No    Home Medications Prior to Admission medications   Medication Sig Start Date End Date Taking? Authorizing Provider  benzonatate (TESSALON) 200 MG capsule Take 1 capsule (200 mg total) by mouth every 8 (eight) hours  for 10 days. 03/03/21 03/13/21 Yes Jeannie Fend, PA-C  cephALEXin (KEFLEX) 500 MG capsule Take 1 capsule (500 mg total) by mouth 3 (three) times daily for 7 days. 03/03/21 03/10/21 Yes Jeannie Fend, PA-C  chlorhexidine (HIBICLENS) 4 % external liquid Apply topically daily as needed. 08/07/18   Dahlia Byes A, NP  cyclobenzaprine (FLEXERIL) 10 MG tablet Take 1 tablet (10 mg total) by mouth 2 (two) times daily as needed for muscle spasms. 04/15/18   Wurst, [place], PA-C  erythromycin ophthalmic ointment Place a 1/2 inch ribbon of ointment into the lower eyelid 4-6 times per day 06/21/18   Wieters, Hallie C, PA-C  HUMIRA PEN 40 MG/0.8ML PNKT SMARTSIG:1 Pre-Filled Pen Syringe SUB-Q Once a Week 10/28/20   [provider]  mupirocin cream (BACTROBAN) 2 % Apply 1 application topically 2 (two) times daily. 08/07/18   Dahlia Byes A, NP  naproxen (NAPROSYN) 500 MG tablet Take 1 tablet (500 mg total) by mouth 2 (two) times daily. 04/15/18   Wurst, [place], PA-C    Allergies    Caffeine  Review of Systems   Review of Systems  Constitutional:  Negative for  chills and fever.  HENT:  Negative for congestion and sore throat.   Respiratory:  Positive for cough.   Musculoskeletal:  Negative for arthralgias and myalgias.  Skin:  Positive for wound.  Allergic/Immunologic: Negative for immunocompromised state.  Neurological:  Negative for weakness and numbness.  All other systems reviewed and are negative.  Physical Exam Updated Vital Signs BP (!) 148/85 (BP Location: Left Arm)   Pulse 76   Temp 98.8 F (37.1 C) (Oral)   Resp 20   Ht 5\' 4"  (1.626 m)   Wt (!) 172.4 kg   LMP 02/27/2021   SpO2 99%   BMI 65.23 kg/m   Physical Exam Vitals and nursing note reviewed.  Constitutional:      General: She is not in acute distress.    Appearance: She is well-developed. She is obese. She is not diaphoretic.  HENT:     Head: Normocephalic and atraumatic.  Cardiovascular:     Pulses: Normal pulses.   Pulmonary:     Effort: Pulmonary effort is normal.  Musculoskeletal:        General: Swelling and tenderness present.     Comments: Right index finger with mild swelling with ttp radial aspect of distal phalanx without obvious felon or paronychia   Skin:    General: Skin is warm and dry.     Findings: No erythema.  Neurological:     Mental Status: She is alert and oriented to person, place, and time.     Sensory: No sensory deficit.     Motor: No weakness.  Psychiatric:        Behavior: Behavior normal.    ED Results / Procedures / Treatments   Labs (all labs ordered are listed, but only abnormal results are displayed) Labs Reviewed - No data to display  EKG None  Radiology No results found.  Procedures Procedures   Medications Ordered in ED Medications - No data to display  ED Course  I have reviewed the triage vital signs and the nursing notes.  Pertinent labs & imaging results that were available during my care of the patient were reviewed by me and considered in my medical decision making (see chart for details).  Clinical Course as of 03/03/21 1932  Tue Mar 03, 2021  638 35 year old female with right index finger pain after pulling a cuticle 1 week ago, dominant hand. No obvious felon or paronychia at this time, does have mild swelling and tenderness to the distal phalanx, radial aspect, will recommend warm water soaks and will start Keflex. Able to move through ROM, do not suspect tenosynovitis at this time. Regarding cough x 2 weeks, BP mildly elevated, O2 sat 99% on room air, respirations even and unlabored. Recommend zyrtec, flonase, coricidin HBP (mildly elevated BP today), given rx for tessalon. Recommend recheck with PCP this week. [LM]    Clinical Course User Index [LM] 20   MDM Rules/Calculators/A&P                           Final Clinical Impression(s) / ED Diagnoses Final diagnoses:  Finger infection  Cough    Rx / DC  Orders ED Discharge Orders          Ordered    benzonatate (TESSALON) 200 MG capsule  Every 8 hours        03/03/21 1925    cephALEXin (KEFLEX) 500 MG capsule  3 times daily  03/03/21 1925             Jeannie Fend, PA-C 03/03/21 1932    Terrilee Files, MD 03/04/21 [phone]

## 2021-03-26 ENCOUNTER — Encounter (HOSPITAL_BASED_OUTPATIENT_CLINIC_OR_DEPARTMENT_OTHER): Payer: Self-pay | Admitting: *Deleted

## 2021-03-26 ENCOUNTER — Emergency Department (HOSPITAL_BASED_OUTPATIENT_CLINIC_OR_DEPARTMENT_OTHER)
Admission: EM | Admit: 2021-03-26 | Discharge: 2021-03-26 | Disposition: A | Discharge status: Home / Self Care | Payer: BC Managed Care – PPO | Attending: Emergency Medicine | Admitting: Emergency Medicine

## 2021-03-26 ENCOUNTER — Emergency Department (HOSPITAL_BASED_OUTPATIENT_CLINIC_OR_DEPARTMENT_OTHER): Payer: BC Managed Care – PPO

## 2021-03-26 ENCOUNTER — Other Ambulatory Visit: Payer: Self-pay

## 2021-03-26 DIAGNOSIS — J45909 Unspecified asthma, uncomplicated: Secondary | ICD-10-CM | POA: Insufficient documentation

## 2021-03-26 DIAGNOSIS — J029 Acute pharyngitis, unspecified: Secondary | ICD-10-CM | POA: Diagnosis not present

## 2021-03-26 DIAGNOSIS — Z20822 Contact with and (suspected) exposure to covid-19: Secondary | ICD-10-CM | POA: Insufficient documentation

## 2021-03-26 DIAGNOSIS — J069 Acute upper respiratory infection, unspecified: Secondary | ICD-10-CM | POA: Diagnosis not present

## 2021-03-26 DIAGNOSIS — R509 Fever, unspecified: Secondary | ICD-10-CM | POA: Diagnosis present

## 2021-03-26 LAB — SARS CORONAVIRUS 2 (TAT 6-24 HRS): SARS Coronavirus 2: NEGATIVE

## 2021-03-26 LAB — GROUP A STREP BY PCR: Group A Strep by PCR: NOT DETECTED

## 2021-03-26 MED ORDER — FLUTICASONE PROPIONATE 50 MCG/ACT NA SUSP: 2.0000 | Freq: Every day | NASAL | 0 refills | Status: DC

## 2021-03-26 MED ORDER — BENZONATATE 100 MG PO CAPS: 100.0000 mg | ORAL_CAPSULE | Freq: Three times a day (TID) | ORAL | 0 refills | Status: DC | PRN

## 2021-03-26 MED ORDER — PREDNISONE 20 MG PO TABS: 20.0000 mg | ORAL_TABLET | Freq: Every day | ORAL | 0 refills | Status: AC

## 2021-03-26 MED ORDER — ALBUTEROL SULFATE HFA 108 (90 BASE) MCG/ACT IN AERS: 1.0000 | INHALATION_SPRAY | Freq: Four times a day (QID) | RESPIRATORY_TRACT | 0 refills | Status: DC | PRN

## 2021-03-26 NOTE — Discharge Instructions (Signed)
Your strep test and chest x-ray here was normal. I am testing you for COVID and those results will come back in the next 24 hours in the MyChart app. Please remain isolated until those results come back. Return to the ED with any new or suddenly worsening symptoms.

## 2021-03-26 NOTE — ED Provider Notes (Signed)
Emergency Department Provider Note   I have reviewed the triage vital signs and the nursing notes.   HISTORY  Chief Complaint URI   HPI Jill Odom is a 35 y.o. female with past history reviewed below presents to the emergency department with cough, congestion, fever over the past 2 days.  She was seen several weeks ago with similar symptoms and denies relief with the prescribed medications.  She is not having chest pain but does have some mild dyspnea.  She has frequent cough.  She notes a history of asthma but does not use an inhaler at home regularly.  She does not appreciate significant wheezing.  No vomiting but has had some mild diarrhea.  No known sick contacts.  No radiation of symptoms or other modifying factors. She has also noted some white spots in the back of her throat with mild pain. No change in voice or difficulty swallowing.    Past Medical History:  Diagnosis Date   Allergy    Asthma    GERD (gastroesophageal reflux disease)     Patient Active Problem List   Diagnosis Date Noted   Asthma 12/25/2011   Shortness of breath 12/12/2011   Anemia 12/12/2011    Past Surgical History:  Procedure Laterality Date   CESAREAN SECTION  12/06/2011   Procedure: CESAREAN SECTION;  Surgeon: Oliver Pila, MD;  Location: [facility];  Service: Gynecology;  Laterality: N/A;    Allergies Caffeine  Family History  Problem Relation Age of Onset   Asthma Father    Hypertension Mother    Hypertension Sister    Hypertension Maternal Grandmother    Hypertension Maternal Grandfather    Hypertension Paternal Grandmother    Hypertension Paternal Grandfather     Social History Social History   Tobacco Use   Smoking status: Never   Smokeless tobacco: Never  Substance Use Topics   Alcohol use: Yes   Drug use: No    Review of Systems  Constitutional: Positive fever/chills Eyes: No visual changes. ENT: Positive sore throat. Cardiovascular: Denies chest  pain. Respiratory: Mild shortness of breath w/ frequent cough.  Gastrointestinal: No abdominal pain.  No nausea, no vomiting. Mild diarrhea.  No constipation. Genitourinary: Negative for dysuria. Musculoskeletal: Negative for back pain. Skin: Negative for rash. Neurological: Negative for focal weakness or numbness. Positive HA.   10-point ROS otherwise negative.  ____________________________________________   PHYSICAL EXAM:  VITAL SIGNS: ED Triage Vitals  Enc Vitals Group     BP 03/26/21 0108 137/82     Pulse Rate 03/26/21 0108 92     Resp 03/26/21 0108 18     Temp 03/26/21 0108 98.8 F (37.1 C)     Temp Source 03/26/21 0108 Oral     SpO2 03/26/21 0108 100 %     Weight 03/26/21 0109 (!) 383 lb (173.7 kg)     Height 03/26/21 0109 5' 3.5" (1.613 m)    Constitutional: Alert and oriented. Well appearing and in no acute distress. Eyes: Conjunctivae are normal.  Head: Atraumatic. Nose: No congestion/rhinnorhea. Mouth/Throat: Mucous membranes are moist.   Neck: No stridor.   Cardiovascular: Normal rate, regular rhythm. Good peripheral circulation. Grossly normal heart sounds.   Respiratory: Normal respiratory effort.  No retractions. Lungs CTAB. Gastrointestinal: Soft and nontender. No distention.  Musculoskeletal: No lower extremity tenderness nor edema. No gross deformities of extremities. Neurologic:  Normal speech and language. No gross focal neurologic deficits are appreciated.  Skin:  Skin is warm, dry and  intact. No rash noted.  ____________________________________________   LABS (all labs ordered are listed, but only abnormal results are displayed)  Labs Reviewed  GROUP A STREP BY PCR  SARS CORONAVIRUS 2 (TAT 6-24 HRS)   ____________________________________________  RADIOLOGY  DG Chest Portable 1 View  Result Date: 03/26/2021 CLINICAL DATA:  Shortness of breath. Upper respiratory infection symptoms without fever for 2 days. EXAM: PORTABLE CHEST 1 VIEW  COMPARISON:  11/21/2020 FINDINGS: The heart size and mediastinal contours are within normal limits. Both lungs are clear. The visualized skeletal structures are unremarkable. IMPRESSION: No active disease. Electronically Signed   By: Burman Nieves M.D.   On: 03/26/2021 03:18    ____________________________________________   PROCEDURES  Procedure(s) performed:   Procedures  None  ____________________________________________   INITIAL IMPRESSION / ASSESSMENT AND PLAN / ED COURSE  Pertinent labs & imaging results that were available during my care of the patient were reviewed by me and considered in my medical decision making (see chart for details).   Patient presents to the emergency department with upper respiratory infection symptoms.  Lungs are clear with no wheezing but occasional bronchospastic cough.  Vital signs within normal limits.  Chest x-ray reviewed showing no infiltrate or other acute process.  Strep PCR is negative.  I not appreciate significant exudate, peritonsillar abscess, other signs/symptoms of the deeper space neck infection.  Do plan to send COVID PCR.  Patient has the MyChart app and will follow test results on her phone and discuss further w/ PCP should test come back positive. Plan for supportive care including steroids, albuterol inh, and other PRN meds.    ____________________________________________  FINAL CLINICAL IMPRESSION(S) / ED DIAGNOSES  Final diagnoses:  Viral upper respiratory tract infection  Sore throat     NEW OUTPATIENT MEDICATIONS STARTED DURING THIS VISIT:  Discharge Medication List as of 03/26/2021  4:17 AM     START taking these medications   Details  albuterol (VENTOLIN HFA) 108 (90 Base) MCG/ACT inhaler Inhale 1-2 puffs into the lungs every 6 (six) hours as needed for wheezing or shortness of breath., Starting Thu 03/26/2021, Normal    benzonatate (TESSALON) 100 MG capsule Take 1 capsule (100 mg total) by mouth 3 (three) times  daily as needed for cough., Starting Thu 03/26/2021, Normal    fluticasone (FLONASE) 50 MCG/ACT nasal spray Place 2 sprays into both nostrils daily for 7 days., Starting Thu 03/26/2021, Until Thu 04/02/2021, Normal    predniSONE (DELTASONE) 20 MG tablet Take 1 tablet (20 mg total) by mouth daily for 5 days., Starting Thu 03/26/2021, Until Tue 03/31/2021, Normal        Note:  This document was prepared using Dragon voice recognition software and may include unintentional dictation errors.  Alona Bene, MD, [facility] Emergency Medicine    Christionna Poland, Arlyss Repress, MD 03/26/21 [phone]

## 2021-03-26 NOTE — ED Triage Notes (Signed)
URI symptoms without fever x 2 days. Seen for same last month-given medication without relief

## 2021-06-26 ENCOUNTER — Emergency Department (HOSPITAL_BASED_OUTPATIENT_CLINIC_OR_DEPARTMENT_OTHER): Payer: BC Managed Care – PPO

## 2021-06-26 ENCOUNTER — Other Ambulatory Visit: Payer: Self-pay

## 2021-06-26 ENCOUNTER — Encounter (HOSPITAL_BASED_OUTPATIENT_CLINIC_OR_DEPARTMENT_OTHER): Payer: Self-pay

## 2021-06-26 ENCOUNTER — Emergency Department (HOSPITAL_BASED_OUTPATIENT_CLINIC_OR_DEPARTMENT_OTHER)
Admission: EM | Admit: 2021-06-26 | Discharge: 2021-06-26 | Disposition: A | Discharge status: Home / Self Care | Payer: BC Managed Care – PPO | Attending: Emergency Medicine | Admitting: Emergency Medicine

## 2021-06-26 DIAGNOSIS — J22 Unspecified acute lower respiratory infection: Secondary | ICD-10-CM | POA: Diagnosis not present

## 2021-06-26 DIAGNOSIS — Z7952 Long term (current) use of systemic steroids: Secondary | ICD-10-CM | POA: Diagnosis not present

## 2021-06-26 DIAGNOSIS — Z20822 Contact with and (suspected) exposure to covid-19: Secondary | ICD-10-CM | POA: Insufficient documentation

## 2021-06-26 DIAGNOSIS — J45909 Unspecified asthma, uncomplicated: Secondary | ICD-10-CM | POA: Diagnosis not present

## 2021-06-26 DIAGNOSIS — R509 Fever, unspecified: Secondary | ICD-10-CM | POA: Diagnosis present

## 2021-06-26 LAB — RESP PANEL BY RT-PCR (FLU A&B, COVID) ARPGX2
Influenza A by PCR: NEGATIVE
Influenza B by PCR: NEGATIVE
SARS Coronavirus 2 by RT PCR: NEGATIVE

## 2021-06-26 MED ORDER — DOXYCYCLINE HYCLATE 100 MG PO CAPS: 100.0000 mg | ORAL_CAPSULE | Freq: Two times a day (BID) | ORAL | 0 refills | Status: AC

## 2021-06-26 NOTE — ED Provider Notes (Signed)
[facility] EMERGENCY DEPARTMENT Provider Note  CSN: [id number] Arrival date & time: 06/26/21 7829    History Chief Complaint  Patient presents with   Cough   Nausea   Diarrhea    Jill Odom is a 35 y.o. female with history of asthma reports 6 days of URI symptoms, had a fever and cough with post-tussive emesis. She has not had a fever since then but has continued to cough. She went to [facility] where she had neg Flu and Covid Ag and PCR tests. She continued to have symptoms so she had a telehealth visit on 12/20 and was prescribed Tamiflu which has caused her some diarrhea. She denies any SOB, chest pains or abdominal pain.    Past Medical History:  Diagnosis Date   Allergy    Asthma    GERD (gastroesophageal reflux disease)     Past Surgical History:  Procedure Laterality Date   CESAREAN SECTION  12/06/2011   Procedure: CESAREAN SECTION;  Surgeon: Oliver Pila, MD;  Location: [facility];  Service: Gynecology;  Laterality: N/A;    Family History  Problem Relation Age of Onset   Asthma Father    Hypertension Mother    Hypertension Sister    Hypertension Maternal Grandmother    Hypertension Maternal Grandfather    Hypertension Paternal Grandmother    Hypertension Paternal Grandfather     Social History   Tobacco Use   Smoking status: Never   Smokeless tobacco: Never  Substance Use Topics   Alcohol use: Yes   Drug use: No     Home Medications Prior to Admission medications   Medication Sig Start Date End Date Taking? Authorizing Provider  doxycycline (VIBRAMYCIN) 100 MG capsule Take 1 capsule (100 mg total) by mouth 2 (two) times daily for 7 days. 06/26/21 07/03/21 Yes Pollyann Savoy, MD  albuterol (VENTOLIN HFA) 108 (90 Base) MCG/ACT inhaler Inhale 1-2 puffs into the lungs every 6 (six) hours as needed for wheezing or shortness of breath. 03/26/21   Long, Arlyss Repress, MD  benzonatate (TESSALON) 100 MG capsule Take 1 capsule (100 mg total) by  mouth 3 (three) times daily as needed for cough. 03/26/21   Long, Arlyss Repress, MD  chlorhexidine (HIBICLENS) 4 % external liquid Apply topically daily as needed. 08/07/18   Dahlia Byes A, NP  cyclobenzaprine (FLEXERIL) 10 MG tablet Take 1 tablet (10 mg total) by mouth 2 (two) times daily as needed for muscle spasms. 04/15/18   Wurst, [place], PA-C  erythromycin ophthalmic ointment Place a 1/2 inch ribbon of ointment into the lower eyelid 4-6 times per day 06/21/18   Wieters, Hallie C, PA-C  fluticasone (FLONASE) 50 MCG/ACT nasal spray Place 2 sprays into both nostrils daily for 7 days. 03/26/21 04/02/21  Long, Arlyss Repress, MD  HUMIRA PEN 40 MG/0.8ML PNKT SMARTSIG:1 Pre-Filled Pen Syringe SUB-Q Once a Week 10/28/20   [provider]  mupirocin cream (BACTROBAN) 2 % Apply 1 application topically 2 (two) times daily. 08/07/18   Dahlia Byes A, NP  naproxen (NAPROSYN) 500 MG tablet Take 1 tablet (500 mg total) by mouth 2 (two) times daily. 04/15/18   Wurst, [place], PA-C     Allergies    Caffeine   Review of Systems   Review of Systems A comprehensive review of systems was completed and negative except as noted in HPI.    Physical Exam BP 135/81    Pulse 98    Temp 98.4 F (36.9 C)  Resp 19    SpO2 98%   Physical Exam Vitals and nursing note reviewed.  Constitutional:      Appearance: Normal appearance.  HENT:     Head: Normocephalic and atraumatic.     Nose: Nose normal.     Mouth/Throat:     Mouth: Mucous membranes are moist.  Eyes:     Extraocular Movements: Extraocular movements intact.     Conjunctiva/sclera: Conjunctivae normal.  Cardiovascular:     Rate and Rhythm: Normal rate.  Pulmonary:     Effort: Pulmonary effort is normal.     Breath sounds: Normal breath sounds. No wheezing, rhonchi or rales.  Abdominal:     General: Abdomen is flat.     Palpations: Abdomen is soft.     Tenderness: There is no abdominal tenderness.  Musculoskeletal:        General: No swelling.  Normal range of motion.     Cervical back: Neck supple.  Skin:    General: Skin is warm and dry.  Neurological:     General: No focal deficit present.     Mental Status: She is alert.  Psychiatric:        Mood and Affect: Mood normal.     ED Results / Procedures / Treatments   Labs (all labs ordered are listed, but only abnormal results are displayed) Labs Reviewed  RESP PANEL BY RT-PCR (FLU A&B, COVID) ARPGX2    EKG None   Radiology DG Chest 2 View  Result Date: 06/26/2021 CLINICAL DATA:  Cough, fever and body aches. Several days duration. EXAM: CHEST - 2 VIEW COMPARISON:  03/26/2021 FINDINGS: Heart size is normal with some left ventricular prominence. Mediastinal shadows are normal. There is a bronchial thickening pattern consistent with bronchitis. Mild focal infiltrate seen on the lateral view could be in the right middle lobe or lingula. No lobar consolidation or collapse. No effusion. Bony structures unremarkable. IMPRESSION: Bronchitis pattern. Mild patchy pneumonia seen on the lateral view could be in the right middle lobe or lingula. Widespread bronchitis pattern. Small area of patchy infiltrate seen on the lateral view could be in the right middle lobe or lingula and indicate minimal patchy pneumonia. Electronically Signed   By: Paulina Fusi M.D.   On: 06/26/2021 08:16    Procedures Procedures  Medications Ordered in the ED Medications - No data to display   MDM Rules/Calculators/A&P MDM Patient with persistent cough, no further fevers. Reports outpatient Flu and Covid swabs were neg. Will check CXR here. She otherwise looks well with normal vitals.   ED Course  I have reviewed the triage vital signs and the nursing notes.  Pertinent labs & imaging results that were available during my care of the patient were reviewed by me and considered in my medical decision making (see chart for details).  Clinical Course as of 06/26/21 0841  Fri Jun 26, 2021  0820 CXR  consistent with bronchitis with possible early pneumonia. Will plan treatment with Abx.  [CS]    Clinical Course User Index [CS] Pollyann Savoy, MD    Final Clinical Impression(s) / ED Diagnoses Final diagnoses:  Lower respiratory tract infection    Rx / DC Orders ED Discharge Orders          Ordered    doxycycline (VIBRAMYCIN) 100 MG capsule  2 times daily        06/26/21 0841             Pollyann Savoy, MD 06/26/21  0841 ° °

## 2021-06-26 NOTE — ED Triage Notes (Signed)
Pt c/o body aches, cough, nausea, vomiting, and diarrhea for about 1 week now.   Pt also reports she has been experiencing numbness to fingers for several months.

## 2021-07-12 ENCOUNTER — Encounter (HOSPITAL_BASED_OUTPATIENT_CLINIC_OR_DEPARTMENT_OTHER): Payer: Self-pay | Admitting: Emergency Medicine

## 2021-07-12 ENCOUNTER — Other Ambulatory Visit: Payer: Self-pay

## 2021-07-12 ENCOUNTER — Emergency Department (HOSPITAL_BASED_OUTPATIENT_CLINIC_OR_DEPARTMENT_OTHER): Payer: BC Managed Care – PPO

## 2021-07-12 ENCOUNTER — Emergency Department (HOSPITAL_BASED_OUTPATIENT_CLINIC_OR_DEPARTMENT_OTHER)
Admission: EM | Admit: 2021-07-12 | Discharge: 2021-07-12 | Disposition: A | Discharge status: Home / Self Care | Payer: BC Managed Care – PPO | Attending: Emergency Medicine | Admitting: Emergency Medicine

## 2021-07-12 DIAGNOSIS — J45909 Unspecified asthma, uncomplicated: Secondary | ICD-10-CM | POA: Insufficient documentation

## 2021-07-12 DIAGNOSIS — R112 Nausea with vomiting, unspecified: Secondary | ICD-10-CM | POA: Diagnosis not present

## 2021-07-12 DIAGNOSIS — J209 Acute bronchitis, unspecified: Secondary | ICD-10-CM | POA: Diagnosis not present

## 2021-07-12 DIAGNOSIS — J40 Bronchitis, not specified as acute or chronic: Secondary | ICD-10-CM

## 2021-07-12 DIAGNOSIS — Z7951 Long term (current) use of inhaled steroids: Secondary | ICD-10-CM | POA: Insufficient documentation

## 2021-07-12 DIAGNOSIS — Z20822 Contact with and (suspected) exposure to covid-19: Secondary | ICD-10-CM | POA: Diagnosis not present

## 2021-07-12 DIAGNOSIS — K910 Vomiting following gastrointestinal surgery: Secondary | ICD-10-CM | POA: Diagnosis present

## 2021-07-12 DIAGNOSIS — Z79899 Other long term (current) drug therapy: Secondary | ICD-10-CM | POA: Insufficient documentation

## 2021-07-12 LAB — CBC WITH DIFFERENTIAL/PLATELET
Abs Immature Granulocytes: 0.05 10*3/uL (ref 0.00–0.07)
Basophils Absolute: 0 10*3/uL (ref 0.0–0.1)
Basophils Relative: 0 %
Eosinophils Absolute: 0.2 10*3/uL (ref 0.0–0.5)
Eosinophils Relative: 2 %
HCT: 32.1 % — ABNORMAL LOW (ref 36.0–46.0)
Hemoglobin: 9.5 g/dL — ABNORMAL LOW (ref 12.0–15.0)
Immature Granulocytes: 1 %
Lymphocytes Relative: 22 %
Lymphs Abs: 2.1 10*3/uL (ref 0.7–4.0)
MCH: 24.2 pg — ABNORMAL LOW (ref 26.0–34.0)
MCHC: 29.6 g/dL — ABNORMAL LOW (ref 30.0–36.0)
MCV: 81.7 fL (ref 80.0–100.0)
Monocytes Absolute: 0.6 10*3/uL (ref 0.1–1.0)
Monocytes Relative: 6 %
Neutro Abs: 6.9 10*3/uL (ref 1.7–7.7)
Neutrophils Relative %: 69 %
Platelets: 355 10*3/uL (ref 150–400)
RBC: 3.93 MIL/uL (ref 3.87–5.11)
RDW: 16 % — ABNORMAL HIGH (ref 11.5–15.5)
WBC: 9.9 10*3/uL (ref 4.0–10.5)
nRBC: 0 % (ref 0.0–0.2)

## 2021-07-12 LAB — COMPREHENSIVE METABOLIC PANEL
ALT: 12 U/L (ref 0–44)
AST: 15 U/L (ref 15–41)
Albumin: 2.9 g/dL — ABNORMAL LOW (ref 3.5–5.0)
Alkaline Phosphatase: 72 U/L (ref 38–126)
Anion gap: 6 (ref 5–15)
BUN: 9 mg/dL (ref 6–20)
CO2: 22 mmol/L (ref 22–32)
Calcium: 10.1 mg/dL (ref 8.9–10.3)
Chloride: 106 mmol/L (ref 98–111)
Creatinine, Ser: 0.67 mg/dL (ref 0.44–1.00)
GFR, Estimated: >60 mL/min (ref >60)
Glucose, Bld: 128 mg/dL — ABNORMAL HIGH (ref 70–99)
Potassium: 3.8 mmol/L (ref 3.5–5.1)
Sodium: 134 mmol/L — ABNORMAL LOW (ref 135–145)
Total Bilirubin: 0.3 mg/dL (ref 0.3–1.2)
Total Protein: 7.9 g/dL (ref 6.5–8.1)

## 2021-07-12 LAB — RESP PANEL BY RT-PCR (FLU A&B, COVID) ARPGX2
Influenza A by PCR: NEGATIVE
Influenza B by PCR: NEGATIVE
SARS Coronavirus 2 by RT PCR: NEGATIVE

## 2021-07-12 LAB — URINALYSIS, ROUTINE W REFLEX MICROSCOPIC
Bilirubin Urine: NEGATIVE
Glucose, UA: NEGATIVE mg/dL
Hgb urine dipstick: NEGATIVE
Ketones, ur: NEGATIVE mg/dL
Nitrite: NEGATIVE
Protein, ur: 30 mg/dL — AB
Specific Gravity, Urine: >=1.03 (ref 1.005–1.030)
pH: 6.5 (ref 5.0–8.0)

## 2021-07-12 LAB — URINALYSIS, MICROSCOPIC (REFLEX)

## 2021-07-12 LAB — PREGNANCY, URINE: Preg Test, Ur: NEGATIVE

## 2021-07-12 LAB — LIPASE, BLOOD: Lipase: 25 U/L (ref 11–51)

## 2021-07-12 MED ORDER — PREDNISONE 20 MG PO TABS
40.0000 mg | ORAL_TABLET | Freq: Once | ORAL | Status: AC
  Administered 2021-07-12: 40 mg via ORAL
  Filled 2021-07-12: qty 2

## 2021-07-12 MED ORDER — SODIUM CHLORIDE 0.9 % IV BOLUS
1000.0000 mL | Freq: Once | INTRAVENOUS | Status: AC
  Administered 2021-07-12: 1000 mL via INTRAVENOUS

## 2021-07-12 MED ORDER — ONDANSETRON HCL 4 MG/2ML IJ SOLN
4.0000 mg | Freq: Once | INTRAMUSCULAR | Status: AC
  Administered 2021-07-12: 4 mg via INTRAVENOUS
  Filled 2021-07-12: qty 2

## 2021-07-12 MED ORDER — PREDNISONE 20 MG PO TABS: 40.0000 mg | ORAL_TABLET | Freq: Every day | ORAL | 0 refills | Status: AC

## 2021-07-12 MED ORDER — AZITHROMYCIN 250 MG PO TABS: 250.0000 mg | ORAL_TABLET | Freq: Every day | ORAL | 0 refills | Status: DC

## 2021-07-12 NOTE — ED Notes (Signed)
Pt. Gone to xray at 1815 then in restroom for extended period of time.  IVF and meds given asap after return to room.

## 2021-07-12 NOTE — Discharge Instructions (Signed)
It was a pleasure taking care of you today!   Your labs were unremarkable.  Your x-ray did not show pneumonia, it did show bronchitis.  Will be sent a prescription for prednisone and azithromycin.  Take as prescribed.  You may follow-up with your primary care provider as needed.  Return to the ED if you experience increasing/worsening breathing, nausea, vomiting, inability to maintain fluid intake, worsening symptoms.

## 2021-07-12 NOTE — ED Provider Notes (Signed)
[facility] EMERGENCY DEPARTMENT Provider Note   CSN: [id number] Arrival date & time: 07/12/21  1801     History  Chief Complaint  Patient presents with   Emesis    Jill Odom is a 36 y.o. female with a past medical history of asthma, who presents to the ED complaining nonbloody emesis onset 2 weeks.  Patient notes that she has emesis every time she eats, approximately 3-4 times a day.  Patient denies sick contacts.  She has been evaluated in the ED and by her primary care for her symptoms.  She has associated dry cough x1 month, posttussive emesis, nausea.  She used her inhaler, however, it causes her to cough, so she doesn't like to use it. Denies fever, chills, abdominal pain, dysuria, hematuria, sore throat, chest pain, shortness of breath.  The history is provided by the patient. No language interpreter was used.   Past Medical History:  Diagnosis Date   Allergy    Asthma    GERD (gastroesophageal reflux disease)    Past Surgical History:  Procedure Laterality Date   CESAREAN SECTION  12/06/2011   Procedure: CESAREAN SECTION;  Surgeon: Logan Bores, MD;  Location: [facility];  Service: Gynecology;  Laterality: N/A;        Home Medications Prior to Admission medications   Medication Sig Start Date End Date Taking? Authorizing Provider  azithromycin (ZITHROMAX) 250 MG tablet Take 1 tablet (250 mg total) by mouth daily. Take first 2 tablets together, then 1 every day until finished. 07/12/21  Yes Meridith Romick A, PA-C  predniSONE (DELTASONE) 20 MG tablet Take 2 tablets (40 mg total) by mouth daily for 4 days. 07/12/21 07/16/21 Yes Socorro Kanitz A, PA-C  albuterol (VENTOLIN HFA) 108 (90 Base) MCG/ACT inhaler Inhale 1-2 puffs into the lungs every 6 (six) hours as needed for wheezing or shortness of breath. 03/26/21   Long, Wonda Olds, MD  benzonatate (TESSALON) 100 MG capsule Take 1 capsule (100 mg total) by mouth 3 (three) times daily as needed for cough. 03/26/21    Long, Wonda Olds, MD  chlorhexidine (HIBICLENS) 4 % external liquid Apply topically daily as needed. 08/07/18   Loura Halt A, NP  cyclobenzaprine (FLEXERIL) 10 MG tablet Take 1 tablet (10 mg total) by mouth 2 (two) times daily as needed for muscle spasms. 04/15/18   Wurst, [place], PA-C  erythromycin ophthalmic ointment Place a 1/2 inch ribbon of ointment into the lower eyelid 4-6 times per day 06/21/18   Wieters, Hallie C, PA-C  fluticasone (FLONASE) 50 MCG/ACT nasal spray Place 2 sprays into both nostrils daily for 7 days. 03/26/21 04/02/21  Long, Wonda Olds, MD  HUMIRA PEN 40 MG/0.8ML PNKT SMARTSIG:1 Pre-Filled Pen Syringe SUB-Q Once a Week 10/28/20   [provider]  mupirocin cream (BACTROBAN) 2 % Apply 1 application topically 2 (two) times daily. 08/07/18   Loura Halt A, NP  naproxen (NAPROSYN) 500 MG tablet Take 1 tablet (500 mg total) by mouth 2 (two) times daily. 04/15/18   Wurst, [place], PA-C      Allergies    Caffeine    Review of Systems   Review of Systems  Constitutional:  Negative for chills and fever.  Respiratory:  Negative for shortness of breath.   Cardiovascular:  Negative for chest pain.  Gastrointestinal:  Positive for nausea and vomiting. Negative for abdominal pain.  Genitourinary:  Negative for dysuria and hematuria.  Skin:  Negative for rash.  All other systems reviewed and  are negative.  Physical Exam Updated Vital Signs BP 130/71    Pulse 93    Temp 98.5 F (36.9 C) (Oral)    Resp 20    Ht 5\' 4"  (1.626 m)    Wt 132.9 kg    LMP 04/23/2021    SpO2 100%    BMI 50.29 kg/m  Physical Exam Vitals and nursing note reviewed.  Constitutional:      General: She is not in acute distress.    Appearance: She is not diaphoretic.     Comments: Exam limited by body habitus.  HENT:     Head: Normocephalic and atraumatic.     Mouth/Throat:     Pharynx: Oropharynx is clear. Uvula midline. No pharyngeal swelling, oropharyngeal exudate, posterior oropharyngeal erythema  or uvula swelling.     Comments: Patent airway. Eyes:     General: No scleral icterus.    Conjunctiva/sclera: Conjunctivae normal.  Cardiovascular:     Rate and Rhythm: Normal rate and regular rhythm.     Pulses: Normal pulses.     Heart sounds: Normal heart sounds.  Pulmonary:     Effort: Pulmonary effort is normal. No respiratory distress.     Breath sounds: Normal breath sounds. No wheezing.  Abdominal:     General: Bowel sounds are normal.     Palpations: Abdomen is soft. There is no mass.     Tenderness: There is no abdominal tenderness. There is no guarding or rebound.  Musculoskeletal:        General: Normal range of motion.     Cervical back: Normal range of motion and neck supple.  Skin:    General: Skin is warm and dry.  Neurological:     Mental Status: She is alert.  Psychiatric:        Behavior: Behavior normal.    ED Results / Procedures / Treatments   Labs (all labs ordered are listed, but only abnormal results are displayed) Labs Reviewed  CBC WITH DIFFERENTIAL/PLATELET - Abnormal; Notable for the following components:      Result Value   Hemoglobin 9.5 (*)    HCT 32.1 (*)    MCH 24.2 (*)    MCHC 29.6 (*)    RDW 16.0 (*)    All other components within normal limits  COMPREHENSIVE METABOLIC PANEL - Abnormal; Notable for the following components:   Sodium 134 (*)    Glucose, Bld 128 (*)    Albumin 2.9 (*)    All other components within normal limits  URINALYSIS, ROUTINE W REFLEX MICROSCOPIC - Abnormal; Notable for the following components:   APPearance CLOUDY (*)    Protein, ur 30 (*)    Leukocytes,Ua LARGE (*)    All other components within normal limits  URINALYSIS, MICROSCOPIC (REFLEX) - Abnormal; Notable for the following components:   Bacteria, UA MANY (*)    All other components within normal limits  RESP PANEL BY RT-PCR (FLU A&B, COVID) ARPGX2  LIPASE, BLOOD  PREGNANCY, URINE    EKG None  Radiology DG Chest 2 View  Result Date:  07/12/2021 CLINICAL DATA:  Cough EXAM: CHEST - 2 VIEW COMPARISON:  06/26/2021 FINDINGS: Bronchitic changes without focal consolidation, pleural effusion, or pneumothorax. Stable cardiomediastinal silhouette. IMPRESSION: Bronchitic changes without focal pneumonia Electronically Signed   By: Donavan Foil M.D.   On: 07/12/2021 18:57    Procedures Procedures    Medications Ordered in ED Medications  predniSONE (DELTASONE) tablet 40 mg (has no administration in time range)  sodium chloride 0.9 % bolus 1,000 mL (1,000 mLs Intravenous New Bag/Given 07/12/21 1919)  ondansetron (ZOFRAN) injection 4 mg (4 mg Intravenous Given 07/12/21 1919)    ED Course/ Medical Decision Making/ A&P Clinical Course as of 07/12/21 2001  Sun Jul 12, 2021  1930 Patient to be p.o. challenge prior to discharge. [SB]  1950 Patient notified of lab and imaging findings.  Patient tolerated p.o. challenge without difficulty in the ED.  Discussed with patient discharge treatment plan.  Patient agreeable to discharge treatment plan at this time.  Patient appears safe for discharge. [SB]    Clinical Course User Index [SB] Ples Trudel A, PA-C                           Medical Decision Making  Patient presents to the emergency department with nonbloody emesis onset 2 weeks.  Patient has been evaluated in the ED for similar concerns.  On exam patient without acute cardiovascular, respiratory, abdominal exam findings.  Vital signs stable, patient afebrile.  Differential diagnosis includes GERD, pancreatitis, UTI, COVID, flu, viral URI with cough, bronchitis.   Labs:  I ordered, and personally interpreted labs.  The pertinent results include: CBC unremarkable, no leukocytosis. Lipase unremarkable. Negative pregnancy test. Urinalysis without acute findings for infection. CMP unremarkable.  Imaging: I ordered imaging studies including chest x-ray I independently visualized and interpreted imaging which showed no acute findings  of infiltrates.  Bronchitis noted. I agree with the radiologist interpretation  Medications:  I ordered medication including Zofran for nausea Reevaluation of the patient after these medicines showed that the patient resolved I have reviewed the patients home medicines and have made adjustments as needed  Reevaluation: After the interventions noted above, I reevaluated the patient and found that they have :resolved   Disposition: Patient presentation suspicious for viral etiology along with bronchitis.  Doubt GERD, pancreatitis, UTI at this time.  After consideration of the diagnostic results and the patients response to treatment, I feel that the patent would benefit from discharge home with azithromycin and prednisone. Supportive care measures and strict return precautions discussed with patient at bedside. Pt acknowledges and verbalizes understanding. Pt appears safe for discharge. Follow up as indicated in discharge paperwork.    This chart was dictated using voice recognition software, Dragon. Despite the best efforts of this provider to proofread and correct errors, errors may still occur which can change documentation meaning.  Final Clinical Impression(s) / ED Diagnoses Final diagnoses:  Bronchitis  Nausea and vomiting, unspecified vomiting type    Rx / DC Orders ED Discharge Orders          Ordered    azithromycin (ZITHROMAX) 250 MG tablet  Daily        07/12/21 1957    predniSONE (DELTASONE) 20 MG tablet  Daily        07/12/21 1957              Jacai Kipp A, PA-C 07/12/21 2002    Ketchum, DO 07/12/21 2008

## 2021-07-12 NOTE — ED Triage Notes (Signed)
Pt arrives pov with c/o cough & n/v for 2 weeks. Pt denies abdominal pain. Reports pain in chest with cough. Pt was seen on 12/23 for similar symptoms

## 2021-12-08 DIAGNOSIS — R062 Wheezing: Secondary | ICD-10-CM | POA: Diagnosis not present

## 2021-12-08 DIAGNOSIS — J22 Unspecified acute lower respiratory infection: Secondary | ICD-10-CM | POA: Diagnosis not present

## 2021-12-08 DIAGNOSIS — R0600 Dyspnea, unspecified: Secondary | ICD-10-CM | POA: Diagnosis not present

## 2021-12-08 DIAGNOSIS — Z20822 Contact with and (suspected) exposure to covid-19: Secondary | ICD-10-CM | POA: Diagnosis not present

## 2022-01-07 DIAGNOSIS — Z6841 Body Mass Index (BMI) 40.0 and over, adult: Secondary | ICD-10-CM | POA: Diagnosis not present

## 2022-01-07 DIAGNOSIS — Z0000 Encounter for general adult medical examination without abnormal findings: Secondary | ICD-10-CM | POA: Diagnosis not present

## 2022-01-07 DIAGNOSIS — Z118 Encounter for screening for other infectious and parasitic diseases: Secondary | ICD-10-CM | POA: Diagnosis not present

## 2022-01-07 DIAGNOSIS — Z113 Encounter for screening for infections with a predominantly sexual mode of transmission: Secondary | ICD-10-CM | POA: Diagnosis not present

## 2022-01-07 DIAGNOSIS — R69 Illness, unspecified: Secondary | ICD-10-CM | POA: Diagnosis not present

## 2022-02-06 ENCOUNTER — Other Ambulatory Visit: Payer: Self-pay

## 2022-02-06 ENCOUNTER — Encounter (HOSPITAL_COMMUNITY)
Admission: EM | Disposition: A | Discharge status: Home / Self Care | Payer: Self-pay | Source: Home / Self Care | Attending: Internal Medicine

## 2022-02-06 ENCOUNTER — Encounter (HOSPITAL_BASED_OUTPATIENT_CLINIC_OR_DEPARTMENT_OTHER): Payer: Self-pay | Admitting: Emergency Medicine

## 2022-02-06 ENCOUNTER — Emergency Department (HOSPITAL_BASED_OUTPATIENT_CLINIC_OR_DEPARTMENT_OTHER): Payer: Managed Care, Other (non HMO)

## 2022-02-06 ENCOUNTER — Encounter (HOSPITAL_COMMUNITY): Payer: Self-pay

## 2022-02-06 ENCOUNTER — Inpatient Hospital Stay (HOSPITAL_COMMUNITY): Payer: Managed Care, Other (non HMO)

## 2022-02-06 ENCOUNTER — Inpatient Hospital Stay (HOSPITAL_COMMUNITY): Payer: Managed Care, Other (non HMO) | Admitting: Certified Registered Nurse Anesthetist

## 2022-02-06 ENCOUNTER — Inpatient Hospital Stay (HOSPITAL_BASED_OUTPATIENT_CLINIC_OR_DEPARTMENT_OTHER)
Admission: EM | Admit: 2022-02-06 | Discharge: 2022-02-09 | DRG: 854 | Disposition: A | Discharge status: Home / Self Care | Payer: Managed Care, Other (non HMO) | Attending: Internal Medicine | Admitting: Internal Medicine

## 2022-02-06 DIAGNOSIS — N3030 Trigonitis without hematuria: Secondary | ICD-10-CM | POA: Diagnosis present

## 2022-02-06 DIAGNOSIS — E876 Hypokalemia: Secondary | ICD-10-CM

## 2022-02-06 DIAGNOSIS — N1330 Unspecified hydronephrosis: Secondary | ICD-10-CM

## 2022-02-06 DIAGNOSIS — R42 Dizziness and giddiness: Secondary | ICD-10-CM | POA: Diagnosis present

## 2022-02-06 DIAGNOSIS — D649 Anemia, unspecified: Secondary | ICD-10-CM | POA: Diagnosis present

## 2022-02-06 DIAGNOSIS — R17 Unspecified jaundice: Secondary | ICD-10-CM | POA: Diagnosis present

## 2022-02-06 DIAGNOSIS — K219 Gastro-esophageal reflux disease without esophagitis: Secondary | ICD-10-CM | POA: Diagnosis present

## 2022-02-06 DIAGNOSIS — A4153 Sepsis due to Serratia: Principal | ICD-10-CM | POA: Diagnosis present

## 2022-02-06 DIAGNOSIS — N136 Pyonephrosis: Secondary | ICD-10-CM | POA: Diagnosis present

## 2022-02-06 DIAGNOSIS — N200 Calculus of kidney: Secondary | ICD-10-CM | POA: Diagnosis not present

## 2022-02-06 DIAGNOSIS — N201 Calculus of ureter: Secondary | ICD-10-CM | POA: Diagnosis not present

## 2022-02-06 DIAGNOSIS — K5900 Constipation, unspecified: Secondary | ICD-10-CM | POA: Diagnosis present

## 2022-02-06 DIAGNOSIS — Z87442 Personal history of urinary calculi: Secondary | ICD-10-CM | POA: Diagnosis not present

## 2022-02-06 DIAGNOSIS — Z79899 Other long term (current) drug therapy: Secondary | ICD-10-CM | POA: Diagnosis not present

## 2022-02-06 DIAGNOSIS — N179 Acute kidney failure, unspecified: Secondary | ICD-10-CM

## 2022-02-06 DIAGNOSIS — Z6841 Body Mass Index (BMI) 40.0 and over, adult: Secondary | ICD-10-CM

## 2022-02-06 DIAGNOSIS — R6520 Severe sepsis without septic shock: Secondary | ICD-10-CM | POA: Diagnosis not present

## 2022-02-06 DIAGNOSIS — J9811 Atelectasis: Secondary | ICD-10-CM | POA: Diagnosis present

## 2022-02-06 DIAGNOSIS — Z20822 Contact with and (suspected) exposure to covid-19: Secondary | ICD-10-CM | POA: Diagnosis present

## 2022-02-06 DIAGNOSIS — Z8249 Family history of ischemic heart disease and other diseases of the circulatory system: Secondary | ICD-10-CM | POA: Diagnosis not present

## 2022-02-06 DIAGNOSIS — Z825 Family history of asthma and other chronic lower respiratory diseases: Secondary | ICD-10-CM | POA: Diagnosis not present

## 2022-02-06 DIAGNOSIS — N12 Tubulo-interstitial nephritis, not specified as acute or chronic: Secondary | ICD-10-CM

## 2022-02-06 DIAGNOSIS — A419 Sepsis, unspecified organism: Secondary | ICD-10-CM

## 2022-02-06 DIAGNOSIS — R509 Fever, unspecified: Secondary | ICD-10-CM | POA: Diagnosis present

## 2022-02-06 DIAGNOSIS — J45909 Unspecified asthma, uncomplicated: Secondary | ICD-10-CM | POA: Diagnosis present

## 2022-02-06 DIAGNOSIS — Z794 Long term (current) use of insulin: Secondary | ICD-10-CM

## 2022-02-06 HISTORY — PX: CYSTOSCOPY WITH STENT PLACEMENT: SHX5790

## 2022-02-06 LAB — CBC WITH DIFFERENTIAL/PLATELET
Abs Immature Granulocytes: 0.13 10*3/uL — ABNORMAL HIGH (ref 0.00–0.07)
Basophils Absolute: 0 10*3/uL (ref 0.0–0.1)
Basophils Relative: 0 %
Eosinophils Absolute: 0 10*3/uL (ref 0.0–0.5)
Eosinophils Relative: 0 %
HCT: 33 % — ABNORMAL LOW (ref 36.0–46.0)
Hemoglobin: 10.1 g/dL — ABNORMAL LOW (ref 12.0–15.0)
Immature Granulocytes: 1 %
Lymphocytes Relative: 4 %
Lymphs Abs: 0.6 10*3/uL — ABNORMAL LOW (ref 0.7–4.0)
MCH: 24.6 pg — ABNORMAL LOW (ref 26.0–34.0)
MCHC: 30.6 g/dL (ref 30.0–36.0)
MCV: 80.3 fL (ref 80.0–100.0)
Monocytes Absolute: 0.4 10*3/uL (ref 0.1–1.0)
Monocytes Relative: 2 %
Neutro Abs: 15.4 10*3/uL — ABNORMAL HIGH (ref 1.7–7.7)
Neutrophils Relative %: 93 %
Platelets: 313 10*3/uL (ref 150–400)
RBC: 4.11 MIL/uL (ref 3.87–5.11)
RDW: 17.1 % — ABNORMAL HIGH (ref 11.5–15.5)
WBC: 16.6 10*3/uL — ABNORMAL HIGH (ref 4.0–10.5)
nRBC: 0 % (ref 0.0–0.2)

## 2022-02-06 LAB — COMPREHENSIVE METABOLIC PANEL
ALT: 16 U/L (ref 0–44)
AST: 32 U/L (ref 15–41)
Albumin: 2.9 g/dL — ABNORMAL LOW (ref 3.5–5.0)
Alkaline Phosphatase: 60 U/L (ref 38–126)
Anion gap: 7 (ref 5–15)
BUN: 13 mg/dL (ref 6–20)
CO2: 20 mmol/L — ABNORMAL LOW (ref 22–32)
Calcium: 9.9 mg/dL (ref 8.9–10.3)
Chloride: 108 mmol/L (ref 98–111)
Creatinine, Ser: 1.6 mg/dL — ABNORMAL HIGH (ref 0.44–1.00)
GFR, Estimated: 43 mL/min — ABNORMAL LOW (ref >60)
Glucose, Bld: 92 mg/dL (ref 70–99)
Potassium: 3.2 mmol/L — ABNORMAL LOW (ref 3.5–5.1)
Sodium: 135 mmol/L (ref 135–145)
Total Bilirubin: 1.7 mg/dL — ABNORMAL HIGH (ref 0.3–1.2)
Total Protein: 8.1 g/dL (ref 6.5–8.1)

## 2022-02-06 LAB — URINALYSIS, ROUTINE W REFLEX MICROSCOPIC
Bilirubin Urine: NEGATIVE
Glucose, UA: NEGATIVE mg/dL
Ketones, ur: NEGATIVE mg/dL
Nitrite: NEGATIVE
Protein, ur: NEGATIVE mg/dL
Specific Gravity, Urine: <=1.005 (ref 1.005–1.030)
pH: 5.5 (ref 5.0–8.0)

## 2022-02-06 LAB — SARS CORONAVIRUS 2 BY RT PCR: SARS Coronavirus 2 by RT PCR: NEGATIVE

## 2022-02-06 LAB — PHOSPHORUS: Phosphorus: 1.2 mg/dL — ABNORMAL LOW (ref 2.5–4.6)

## 2022-02-06 LAB — PROCALCITONIN: Procalcitonin: 3.49 ng/mL

## 2022-02-06 LAB — PREGNANCY, URINE: Preg Test, Ur: NEGATIVE

## 2022-02-06 LAB — TROPONIN I (HIGH SENSITIVITY)
Troponin I (High Sensitivity): 7 ng/L (ref <18)
Troponin I (High Sensitivity): 6 ng/L (ref <18)

## 2022-02-06 LAB — LACTIC ACID, PLASMA: Lactic Acid, Venous: 1.9 mmol/L (ref 0.5–1.9)

## 2022-02-06 LAB — URINALYSIS, MICROSCOPIC (REFLEX)

## 2022-02-06 LAB — CK: Total CK: 141 U/L (ref 38–234)

## 2022-02-06 LAB — MAGNESIUM: Magnesium: 1.6 mg/dL — ABNORMAL LOW (ref 1.7–2.4)

## 2022-02-06 SURGERY — CYSTOSCOPY, WITH STENT INSERTION
Anesthesia: General | Laterality: Left

## 2022-02-06 MED ORDER — SODIUM CHLORIDE 0.9 % IV BOLUS
1000.0000 mL | Freq: Once | INTRAVENOUS | Status: AC
  Administered 2022-02-06: 1000 mL via INTRAVENOUS

## 2022-02-06 MED ORDER — OXYCODONE HCL 5 MG/5ML PO SOLN: 5.0000 mg | Freq: Once | ORAL | Status: DC | PRN

## 2022-02-06 MED ORDER — LIDOCAINE 2% (20 MG/ML) 5 ML SYRINGE
INTRAMUSCULAR | Status: DC | PRN
  Administered 2022-02-06: 50 mg via INTRAVENOUS
  Administered 2022-02-06: 100 mg via INTRAVENOUS

## 2022-02-06 MED ORDER — SODIUM CHLORIDE 0.9 % IV SOLN
1.0000 g | Freq: Once | INTRAVENOUS | Status: AC
  Administered 2022-02-06: 1 g via INTRAVENOUS
  Filled 2022-02-06: qty 10

## 2022-02-06 MED ORDER — ACETAMINOPHEN 10 MG/ML IV SOLN
1000.0000 mg | Freq: Once | INTRAVENOUS | Status: DC | PRN
  Administered 2022-02-06: 1000 mg via INTRAVENOUS

## 2022-02-06 MED ORDER — ACETAMINOPHEN 500 MG PO TABS
1000.0000 mg | ORAL_TABLET | Freq: Once | ORAL | Status: AC | PRN
Start: 2022-02-06 — End: 2022-02-06
  Administered 2022-02-06: 1000 mg via ORAL
  Filled 2022-02-06: qty 2

## 2022-02-06 MED ORDER — LACTATED RINGERS IV SOLN: INTRAVENOUS | Status: DC | PRN

## 2022-02-06 MED ORDER — ONDANSETRON HCL 4 MG/2ML IJ SOLN
4.0000 mg | Freq: Once | INTRAMUSCULAR | Status: AC
  Administered 2022-02-06: 4 mg via INTRAVENOUS
  Filled 2022-02-06: qty 2

## 2022-02-06 MED ORDER — LIP MEDEX EX OINT
TOPICAL_OINTMENT | CUTANEOUS | Status: AC
  Filled 2022-02-06: qty 7

## 2022-02-06 MED ORDER — SUCCINYLCHOLINE CHLORIDE 200 MG/10ML IV SOSY
PREFILLED_SYRINGE | INTRAVENOUS | Status: DC | PRN
  Administered 2022-02-06: 140 mg via INTRAVENOUS

## 2022-02-06 MED ORDER — SODIUM CHLORIDE 0.9 % IV SOLN
2.0000 g | Freq: Once | INTRAVENOUS | Status: AC
  Administered 2022-02-06: 2 g via INTRAVENOUS
  Filled 2022-02-06: qty 20

## 2022-02-06 MED ORDER — ONDANSETRON HCL 4 MG/2ML IJ SOLN: 4.0000 mg | Freq: Once | INTRAMUSCULAR | Status: DC | PRN

## 2022-02-06 MED ORDER — IOHEXOL 300 MG/ML  SOLN
80.0000 mL | Freq: Once | INTRAMUSCULAR | Status: AC | PRN
  Administered 2022-02-06: 80 mL via INTRAVENOUS

## 2022-02-06 MED ORDER — HYDROMORPHONE HCL 1 MG/ML IJ SOLN: 0.2500 mg | INTRAMUSCULAR | Status: DC | PRN

## 2022-02-06 MED ORDER — FENTANYL CITRATE (PF) 100 MCG/2ML IJ SOLN
INTRAMUSCULAR | Status: AC
  Filled 2022-02-06: qty 2

## 2022-02-06 MED ORDER — IOHEXOL 300 MG/ML  SOLN
INTRAMUSCULAR | Status: DC | PRN
  Administered 2022-02-06: 50 mL via URETHRAL

## 2022-02-06 MED ORDER — ACETAMINOPHEN 10 MG/ML IV SOLN
INTRAVENOUS | Status: AC
  Filled 2022-02-06: qty 100

## 2022-02-06 MED ORDER — ONDANSETRON HCL 4 MG/2ML IJ SOLN
INTRAMUSCULAR | Status: DC | PRN
  Administered 2022-02-06: 4 mg via INTRAVENOUS

## 2022-02-06 MED ORDER — DEXAMETHASONE SODIUM PHOSPHATE 10 MG/ML IJ SOLN
INTRAMUSCULAR | Status: DC | PRN
  Administered 2022-02-06: 10 mg via INTRAVENOUS

## 2022-02-06 MED ORDER — AMISULPRIDE (ANTIEMETIC) 5 MG/2ML IV SOLN: 10.0000 mg | Freq: Once | INTRAVENOUS | Status: DC | PRN

## 2022-02-06 MED ORDER — STERILE WATER FOR IRRIGATION IR SOLN
Status: DC | PRN
  Administered 2022-02-06: 3000 mL

## 2022-02-06 MED ORDER — FENTANYL CITRATE (PF) 100 MCG/2ML IJ SOLN
INTRAMUSCULAR | Status: DC | PRN
  Administered 2022-02-06: 100 ug via INTRAVENOUS

## 2022-02-06 MED ORDER — PROPOFOL 10 MG/ML IV BOLUS
INTRAVENOUS | Status: AC
  Filled 2022-02-06: qty 20

## 2022-02-06 MED ORDER — PROPOFOL 10 MG/ML IV BOLUS
INTRAVENOUS | Status: DC | PRN
  Administered 2022-02-06: 100 mg via INTRAVENOUS
  Administered 2022-02-06: 200 mg via INTRAVENOUS

## 2022-02-06 MED ORDER — OXYCODONE HCL 5 MG PO TABS: 5.0000 mg | ORAL_TABLET | Freq: Once | ORAL | Status: DC | PRN

## 2022-02-06 MED ORDER — FENTANYL CITRATE PF 50 MCG/ML IJ SOSY
100.0000 ug | PREFILLED_SYRINGE | Freq: Once | INTRAMUSCULAR | Status: AC
  Administered 2022-02-06: 100 ug via INTRAVENOUS
  Filled 2022-02-06: qty 2

## 2022-02-06 SURGICAL SUPPLY — 13 items
BAG URO CATCHER STRL LF (MISCELLANEOUS) ×2 IMPLANT
CATH URETL OPEN 5X70 (CATHETERS) IMPLANT
CLOTH BEACON ORANGE TIMEOUT ST (SAFETY) ×2 IMPLANT
GLOVE SURG SS PI 8.0 STRL IVOR (GLOVE) IMPLANT
GOWN STRL REUS W/ TWL XL LVL3 (GOWN DISPOSABLE) ×1 IMPLANT
GOWN STRL REUS W/TWL XL LVL3 (GOWN DISPOSABLE) ×2
GUIDEWIRE STR DUAL SENSOR (WIRE) ×2 IMPLANT
MANIFOLD NEPTUNE II (INSTRUMENTS) ×2 IMPLANT
PACK CYSTO (CUSTOM PROCEDURE TRAY) ×2 IMPLANT
STENT URET 6FRX24 CONTOUR (STENTS) ×1 IMPLANT
TRAY FOLEY MTR SLVR 16FR STAT (SET/KITS/TRAYS/PACK) ×1 IMPLANT
TUBING CONNECTING 10 (TUBING) ×2 IMPLANT
TUBING UROLOGY SET (TUBING) IMPLANT

## 2022-02-06 NOTE — ED Notes (Signed)
Pt in bathroom when called for triage. 

## 2022-02-06 NOTE — Anesthesia Preprocedure Evaluation (Addendum)
Anesthesia Evaluation  Patient identified by MRN, date of birth, ID band Patient awake    Reviewed: Allergy & Precautions, NPO status , Patient's Chart, lab work & pertinent test results  Airway Mallampati: II  TM Distance: >3 FB Neck ROM: Full    Dental no notable dental hx. (+) Teeth Intact, Dental Advisory Given   Pulmonary asthma ,    Pulmonary exam normal breath sounds clear to auscultation       Cardiovascular Exercise Tolerance: Good Normal cardiovascular exam Rhythm:Regular Rate:Normal     Neuro/Psych    GI/Hepatic Neg liver ROS, GERD  ,  Endo/Other  Morbid obesity (66.1)  Renal/GU Renal diseasenephrolithiasis     Musculoskeletal   Abdominal   Peds  Hematology  (+) Blood dyscrasia, anemia , Lab Results      Component                Value               Date                      WBC                      16.6 (H)            02/06/2022                HGB                      10.1 (L)            02/06/2022                HCT                      33.0 (L)            02/06/2022                MCV                      80.3                02/06/2022                PLT                      313                 02/06/2022              Anesthesia Other Findings   Reproductive/Obstetrics negative OB ROS                            Anesthesia Physical Anesthesia Plan  ASA: 3 and emergent  Anesthesia Plan: General   Post-op Pain Management:    Induction: Intravenous  PONV Risk Score and Plan: 4 or greater and Treatment may vary due to age or medical condition, Midazolam and Ondansetron  Airway Management Planned: LMA  Additional Equipment: None  Intra-op Plan:   Post-operative Plan: Extubation in OR  Informed Consent:     Dental advisory given  Plan Discussed with: CRNA  Anesthesia Plan Comments:        Anesthesia Quick Evaluation

## 2022-02-06 NOTE — ED Notes (Signed)
Pt called out complains of chills and SOB and nausea. MD made aware and asked to see pt. Pt Vitals updated. Pt alert and oriented speaking in full sentences.

## 2022-02-06 NOTE — Anesthesia Procedure Notes (Signed)
Procedure Name: Intubation Date/Time: 02/06/2022 10:15 PM  Performed by: Ezekiel Ina, CRNAPre-anesthesia Checklist: Patient identified, Emergency Drugs available, Suction available and Patient being monitored Patient Re-evaluated:Patient Re-evaluated prior to induction Oxygen Delivery Method: Circle system utilized Preoxygenation: Pre-oxygenation with 100% oxygen Induction Type: IV induction, Rapid sequence and Cricoid Pressure applied Laryngoscope Size: Glidescope and 4 Grade View: Grade I Tube type: Oral Tube size: 7.5 mm Number of attempts: 1 Airway Equipment and Method: Stylet Placement Confirmation: ETT inserted through vocal cords under direct vision, positive ETCO2 and breath sounds checked- equal and bilateral Secured at: 23 cm Tube secured with: Tape Dental Injury: Teeth and Oropharynx as per pre-operative assessment  Difficulty Due To: Difficult Airway-  due to edematous airway Comments: Grade 1 with Glidescope 4

## 2022-02-06 NOTE — H&P (View-Only) (Signed)
Subjective: 1. Fever in adult   2. AKI (acute kidney injury) (HCC)   3. Hypokalemia   4. Kidney stone   5. Sepsis with acute renal failure without septic shock, due to unspecified organism, unspecified acute renal failure type [facility])      Consult requested by Dr. Carollee Herter.   Jill Odom is a 36 yo female who had the onset over the last 24 hours of constipation followed by a fever to 103 and some left flank pain.  She has no voiding complaints and has had no significant prior GU history.  She had a CT in the ER and was found to have an obstructing 1.1cm left proximal ureteral stone with infected appearing urine AKI and a leukocytosis.  She has chronic anemia.  ROS:  Review of Systems  Constitutional:  Positive for chills and fever.  Respiratory:  Positive for cough.   Cardiovascular:  Positive for chest pain.  Gastrointestinal:  Positive for constipation.  Genitourinary:  Positive for flank pain.  All other systems reviewed and are negative.   Allergies  Allergen Reactions   Caffeine Other (See Comments)    Itching and throat swelling.    Past Medical History:  Diagnosis Date   Allergy    Asthma    GERD (gastroesophageal reflux disease)     Past Surgical History:  Procedure Laterality Date   CESAREAN SECTION  12/06/2011   Procedure: CESAREAN SECTION;  Surgeon: Oliver Pila, MD;  Location: [facility];  Service: Gynecology;  Laterality: N/A;    Social History   Socioeconomic History   Marital status: Single    Spouse name: Not on file   Number of children: 1   Years of education: Not on file   Highest education level: Not on file  Occupational History   Occupation: SERVER    Employer: [employer]  Tobacco Use   Smoking status: Never   Smokeless tobacco: Never  Substance and Sexual Activity   Alcohol use: Yes   Drug use: No   Sexual activity: Yes    Birth control/protection: None  Other Topics Concern   Not on file  Social History Narrative   Not on file   Social  Determinants of Health   Financial Resource Strain: Not on file  Food Insecurity: Not on file  Transportation Needs: Not on file  Physical Activity: Not on file  Stress: Not on file  Social Connections: Not on file  Intimate Partner Violence: Not on file    Family History  Problem Relation Age of Onset   Asthma Father    Hypertension Mother    Hypertension Sister    Hypertension Maternal Grandmother    Hypertension Maternal Grandfather    Hypertension Paternal Grandmother    Hypertension Paternal Grandfather     Anti-infectives: Anti-infectives (From admission, onward)    Start     Dose/Rate Route Frequency Ordered Stop   02/06/22 1815  cefTRIAXone (ROCEPHIN) 2 g in sodium chloride 0.9 % 100 mL IVPB        2 g 200 mL/hr over 30 Minutes Intravenous  Once 02/06/22 1806 02/06/22 1913   02/06/22 1715  cefTRIAXone (ROCEPHIN) 1 g in sodium chloride 0.9 % 100 mL IVPB        1 g 200 mL/hr over 30 Minutes Intravenous  Once 02/06/22 1714 02/06/22 1757       No current facility-administered medications for this encounter.     Objective: Vital signs in last 24 hours: BP 127/79   Pulse Marland Kitchen)  122   Temp 98.3 F (36.8 C)   Resp (!) 37   Ht 5\' 4"  (1.626 m)   Wt (!) 174.6 kg   LMP 01/16/2022   SpO2 100%   BMI 66.09 kg/m   Intake/Output from previous day: No intake/output data recorded. Intake/Output this shift: No intake/output data recorded.   Physical Exam Constitutional:      Appearance: Normal appearance. She is obese.  HENT:     Head: Normocephalic and atraumatic.  Cardiovascular:     Rate and Rhythm: Regular rhythm. Tachycardia present.     Heart sounds: Normal heart sounds.  Pulmonary:     Effort: Pulmonary effort is normal. No respiratory distress.     Breath sounds: Normal breath sounds.  Abdominal:     Palpations: Abdomen is soft.     Tenderness: There is left CVA tenderness.  Musculoskeletal:        General: No swelling or tenderness. Normal range of  motion.  Skin:    General: Skin is warm and dry.  Neurological:     General: No focal deficit present.     Mental Status: She is alert and oriented to person, place, and time.  Psychiatric:        Mood and Affect: Mood normal.        Behavior: Behavior normal.     Lab Results:  Results for orders placed or performed during the hospital encounter of 02/06/22 (from the past 24 hour(s))  SARS Coronavirus 2 by RT PCR (hospital order, performed in [facility] hospital lab) *cepheid single result test* Anterior Nasal Swab     Status: None   Collection Time: 02/06/22  1:44 PM   Specimen: Anterior Nasal Swab  Result Value Ref Range   SARS Coronavirus 2 by RT PCR NEGATIVE NEGATIVE  Lactic acid, plasma     Status: None   Collection Time: 02/06/22  1:44 PM  Result Value Ref Range   Lactic Acid, Venous 1.9 0.5 - 1.9 mmol/L  Comprehensive metabolic panel     Status: Abnormal   Collection Time: 02/06/22  1:44 PM  Result Value Ref Range   Sodium 135 135 - 145 mmol/L   Potassium 3.2 (L) 3.5 - 5.1 mmol/L   Chloride 108 98 - 111 mmol/L   CO2 20 (L) 22 - 32 mmol/L   Glucose, Bld 92 70 - 99 mg/dL   BUN 13 6 - 20 mg/dL   Creatinine, Ser 04/08/22 (H) 0.44 - 1.00 mg/dL   Calcium 9.9 8.9 - 5.36 mg/dL   Total Protein 8.1 6.5 - 8.1 g/dL   Albumin 2.9 (L) 3.5 - 5.0 g/dL   AST 32 15 - 41 U/L   ALT 16 0 - 44 U/L   Alkaline Phosphatase 60 38 - 126 U/L   Total Bilirubin 1.7 (H) 0.3 - 1.2 mg/dL   GFR, Estimated 43 (L) >60 mL/min   Anion gap 7 5 - 15  CBC with Differential     Status: Abnormal   Collection Time: 02/06/22  1:44 PM  Result Value Ref Range   WBC 16.6 (H) 4.0 - 10.5 K/uL   RBC 4.11 3.87 - 5.11 MIL/uL   Hemoglobin 10.1 (L) 12.0 - 15.0 g/dL   HCT 04/08/22 (L) 03.4 - 74.2 %   MCV 80.3 80.0 - 100.0 fL   MCH 24.6 (L) 26.0 - 34.0 pg   MCHC 30.6 30.0 - 36.0 g/dL   RDW 59.5 (H) 63.8 - 75.6 %   Platelets 313 150 -  400 K/uL   nRBC 0.0 0.0 - 0.2 %   Neutrophils Relative % 93 %   Neutro Abs 15.4 (H)  1.7 - 7.7 K/uL   Lymphocytes Relative 4 %   Lymphs Abs 0.6 (L) 0.7 - 4.0 K/uL   Monocytes Relative 2 %   Monocytes Absolute 0.4 0.1 - 1.0 K/uL   Eosinophils Relative 0 %   Eosinophils Absolute 0.0 0.0 - 0.5 K/uL   Basophils Relative 0 %   Basophils Absolute 0.0 0.0 - 0.1 K/uL   Immature Granulocytes 1 %   Abs Immature Granulocytes 0.13 (H) 0.00 - 0.07 K/uL  Troponin I (High Sensitivity)     Status: None   Collection Time: 02/06/22  1:44 PM  Result Value Ref Range   Troponin I (High Sensitivity) 7 <18 ng/L  CK     Status: None   Collection Time: 02/06/22  1:44 PM  Result Value Ref Range   Total CK 141 38 - 234 U/L  Phosphorus     Status: Abnormal   Collection Time: 02/06/22  1:44 PM  Result Value Ref Range   Phosphorus 1.2 (L) 2.5 - 4.6 mg/dL  Magnesium     Status: Abnormal   Collection Time: 02/06/22  1:44 PM  Result Value Ref Range   Magnesium 1.6 (L) 1.7 - 2.4 mg/dL  Urinalysis, Routine w reflex microscopic Urine, Clean Catch     Status: Abnormal   Collection Time: 02/06/22  3:44 PM  Result Value Ref Range   Color, Urine YELLOW YELLOW   APPearance CLEAR CLEAR   Specific Gravity, Urine <=1.005 1.005 - 1.030   pH 5.5 5.0 - 8.0   Glucose, UA NEGATIVE NEGATIVE mg/dL   Hgb urine dipstick MODERATE (A) NEGATIVE   Bilirubin Urine NEGATIVE NEGATIVE   Ketones, ur NEGATIVE NEGATIVE mg/dL   Protein, ur NEGATIVE NEGATIVE mg/dL   Nitrite NEGATIVE NEGATIVE   Leukocytes,Ua TRACE (A) NEGATIVE  Pregnancy, urine     Status: None   Collection Time: 02/06/22  3:44 PM  Result Value Ref Range   Preg Test, Ur NEGATIVE NEGATIVE  Urinalysis, Microscopic (reflex)     Status: Abnormal   Collection Time: 02/06/22  3:44 PM  Result Value Ref Range   RBC / HPF 0-5 0 - 5 RBC/hpf   WBC, UA 0-5 0 - 5 WBC/hpf   Bacteria, UA MANY (A) NONE SEEN   Squamous Epithelial / LPF 0-5 0 - 5  Troponin I (High Sensitivity)     Status: None   Collection Time: 02/06/22  3:57 PM  Result Value Ref Range    Troponin I (High Sensitivity) 6 <18 ng/L    BMET Recent Labs    02/06/22 1344  NA 135  K 3.2*  CL 108  CO2 20*  GLUCOSE 92  BUN 13  CREATININE 1.60*  CALCIUM 9.9   PT/INR No results for input(s): "LABPROT", "INR" in the last 72 hours. ABG No results for input(s): "PHART", "HCO3" in the last 72 hours.  Invalid input(s): "PCO2", "PO2"  Studies/Results: CT CHEST ABDOMEN PELVIS W CONTRAST  Result Date: 02/06/2022 CLINICAL DATA:  Sepsis. Left lower quadrant pain. Cough fever and chills. EXAM: CT CHEST, ABDOMEN, AND PELVIS WITH CONTRAST TECHNIQUE: Multidetector CT imaging of the chest, abdomen and pelvis was performed following the standard protocol during bolus administration of intravenous contrast. RADIATION DOSE REDUCTION: This exam was performed according to the departmental dose-optimization program which includes automated exposure control, adjustment of the mA and/or kV according to patient  size and/or use of iterative reconstruction technique. CONTRAST:  32mL OMNIPAQUE IOHEXOL 300 MG/ML  SOLN COMPARISON:  December 12, 2011 FINDINGS: CT CHEST FINDINGS Cardiovascular: No significant vascular findings. Normal heart size. No pericardial effusion. Mediastinum/Nodes: No enlarged mediastinal, hilar, or axillary lymph nodes. Thyroid gland, trachea, and esophagus demonstrate no significant findings. Lungs/Pleura: Lungs are clear. No pleural effusion or pneumothorax. Mild atelectasis in the lingula. Musculoskeletal: No chest wall mass or suspicious bone lesions identified. CT ABDOMEN PELVIS FINDINGS Hepatobiliary: No focal liver abnormality is seen. No gallstones, gallbladder wall thickening, or biliary dilatation. Pancreas: Unremarkable. No pancreatic ductal dilatation or surrounding inflammatory changes. Spleen: Normal in size without focal abnormality. Adrenals/Urinary Tract: Normal adrenal glands. Normal right kidney, right ureter and urinary bladder. There is an obstructive 1.1 cm proximal left  ureteral calculus with moderate left hydronephrosis and proximal hydroureter. There is significant left perinephric fat stranding. Stomach/Bowel: Stomach is within normal limits. Appendix appears normal. No evidence of bowel wall thickening, distention, or inflammatory changes. Vascular/Lymphatic: No significant vascular findings are present. No enlarged abdominal or pelvic lymph nodes. Reproductive: Uterus and bilateral adnexa are unremarkable. Other: No abdominal wall hernia or abnormality. No abdominopelvic ascites. Musculoskeletal: No acute or significant osseous findings. IMPRESSION: Obstructing 1.1 cm proximal left ureteral calculus with moderate left hydronephrosis and proximal hydroureter. Significant left perinephric inflammatory fat stranding. Atelectasis in the lingula. Electronically Signed   By: Ted Mcalpine M.D.   On: 02/06/2022 17:05   DG Chest 2 View  Result Date: 02/06/2022 CLINICAL DATA:  cough EXAM: CHEST - 2 VIEW COMPARISON:  Radiograph dated January 2023 FINDINGS: The cardiomediastinal silhouette is unchanged in contour. No pleural effusion. No pneumothorax. LEFT lateral hazy platelike opacity. Visualized abdomen is unremarkable. No acute osseous abnormality. IMPRESSION: LEFT lateral basilar hazy platelike opacity. Differential considerations include atelectasis, infection or aspiration. Electronically Signed   By: Meda Klinefelter M.D.   On: 02/06/2022 13:43     Assessment/Plan: Left proximal ureteral stone with obstruction and sepsis with AKI.  I will take her to the OR for cystoscopy and left stent insertion.  I have reviewed the risks of bleeding, infection, ureteral injury, need for secondary procedures, thrombotic events, stent irritation and anesthetic complications.         No follow-ups on file.    CC: Dr. Carollee Herter.      Bjorn Pippin 02/06/2022 [phone]

## 2022-02-06 NOTE — ED Provider Notes (Addendum)
  Physical Exam  BP 127/77   Pulse 93   Temp 98.6 F (37 C) (Oral)   Resp 18   Ht 5\' 4"  (1.626 m)   Wt (!) 174.6 kg   LMP 01/16/2022   SpO2 98%   BMI 66.09 kg/m   Physical Exam  Procedures  .Critical Care  Performed by: 01/18/2022, MD Authorized by: Derwood Kaplan, MD   Critical care provider statement:    Critical care time (minutes):  30   Critical care was necessary to treat or prevent imminent or life-threatening deterioration of the following conditions:  Sepsis   Critical care was time spent personally by me on the following activities:  Development of treatment plan with patient or surrogate, discussions with consultants, evaluation of patient's response to treatment, examination of patient, ordering and review of laboratory studies, ordering and review of radiographic studies, ordering and performing treatments and interventions, pulse oximetry, re-evaluation of patient's condition and review of old charts   ED Course / MDM   Clinical Course as of 02/06/22 1822  Sat Feb 06, 2022  1401 EKG did not cross in epic.  Sinus tachycardia nonspecific ST changes similar to prior. [MB]  1401 Chest x-ray interpreted by me as poor inspiration atelectasis left base cannot exclude infiltrate. [MB]    Clinical Course User Index [MB] Feb 08, 2022, MD   Medical Decision Making Amount and/or Complexity of Data Reviewed Labs: ordered. Radiology: ordered.  Risk OTC drugs. Prescription drug management. Decision regarding hospitalization.    Pt comes in with chief complaint of feeling unwell for the last few days.  She was noted to be febrile.  She is complaining of some cough.  At this time, CT chest abdomen pelvis pending  Patient reassessed.  Indicates that she is having some left-sided flank pain, sharp.  That pain started yesterday.  She has also been feeling unwell for the last few days.  Cough is mostly nonproductive.  6:24 PM Patient CT chest abdomen and  pelvis is positive for 1.1 cm left-sided ureteral stone.  Patient has hydronephrosis and also fat stranding around her kidney.  She had couple of low blood pressures earlier.  Patient has elevated creatinine and slightly elevated bilirubin.  Lactic acid is normal.  Elevated white count.  Concerns for septic stone.    I discussed case with Dr. Terrilee Files, urology.  He recommends admission to the hospital.  He also is requesting that we give patient 3 g of ceftriaxone given her weight.  Results of the ER work-up discussed with the patient.  She is made aware that we will be admitting her to the hospital.    Annabell Howells, MD 02/06/22 1825  8:10 PM Patient had an episode of rigors and difficulty in breathing.  I reassessed her.  She is tachypneic at respiratory rate of 24.  O2 sats are normal.  She is AOx3.  She is now noted to have a fever again.  Likely the symptoms are secondary to cytokine release from her body.  She is complaining of pain in the left upper quadrant.  Fentanyl 100 mcg along with Zofran for nausea ordered.  Patient tachycardic, likely because of the acute change mentioned above.  Blood pressure still stable and within normal limits.  Stable for transport.    04/08/22, MD 02/06/22 2011

## 2022-02-06 NOTE — Assessment & Plan Note (Addendum)
Admit to inpatient telemetry. Continue with IV Rocephin 2 gram qday with presumed pyelonephritis from infected stone.  Awaiting blood and urine cx. Going to OR tonight with urology.

## 2022-02-06 NOTE — Assessment & Plan Note (Signed)
Continue with IVF. Hold nephrotoxic agent. Repeat CMP in AM.

## 2022-02-06 NOTE — ED Triage Notes (Signed)
Pt arrives pov, slow gait, c/o fatigue, cough, fever and chills since last night. Took laxative yesterday for constipation and now having diarrhea

## 2022-02-06 NOTE — Assessment & Plan Note (Signed)
Due to infected stone. Continue IV Rocephin

## 2022-02-06 NOTE — Transfer of Care (Signed)
Immediate Anesthesia Transfer of Care Note  Patient: Jill Odom  Procedure(s) Performed: CYSTOSCOPY WITH STENT PLACEMENT (Left)  Patient Location: PACU  Anesthesia Type:General  Level of Consciousness: awake  Airway & Oxygen Therapy: Patient Spontanous Breathing and Patient connected to face mask oxygen  Post-op Assessment: Report given to RN and Post -op Vital signs reviewed and stable  Post vital signs: Reviewed and stable  Last Vitals:  Vitals Value Taken Time  BP 104/55 02/06/22 2243  Temp    Pulse 128 02/06/22 2244  Resp 25 02/06/22 2244  SpO2 98 % 02/06/22 2244  Vitals shown include unvalidated device data.  Last Pain:  Vitals:   02/06/22 2011  TempSrc: Oral  PainSc:          Complications: No notable events documented.

## 2022-02-06 NOTE — Assessment & Plan Note (Signed)
Weight 174.6 kg. BMI 66.09

## 2022-02-06 NOTE — ED Provider Notes (Signed)
[facility] DEPART[facility] Provider Note   CSN: [id number] Arrival date & time: 02/06/22  1242     History  Chief Complaint  Patient presents with   Fever    Jill Odom is a 36 y.o. female.  She has a history of asthma and anemia.  Complaining of a few days of feeling weak and dizzy, feeling hot and cold cough productive of some clear sputum, left lower quadrant abdominal pain.  Was initially constipated and took a laxative and now is having loose stools.  No sore throat runny nose vomiting or urinary symptoms.  No rashes or swollen joints.  Has tried some Tylenol without improvement.  The history is provided by the patient.  Fever Max temp prior to arrival:  103 Temp source:  Oral Severity:  Moderate Onset quality:  Gradual Duration:  2 days Chronicity:  New Relieved by:  Nothing Worsened by:  Nothing Ineffective treatments:  Acetaminophen Associated symptoms: chest pain, chills, cough, diarrhea and nausea   Associated symptoms: no dysuria, no rash, no sore throat and no vomiting   Risk factors: no recent travel and no sick contacts        Home Medications Prior to Admission medications   Medication Sig Start Date End Date Taking? Authorizing Provider  albuterol (VENTOLIN HFA) 108 (90 Base) MCG/ACT inhaler Inhale 1-2 puffs into the lungs every 6 (six) hours as needed for wheezing or shortness of breath. 03/26/21   Long, Wonda Olds, MD  azithromycin (ZITHROMAX) 250 MG tablet Take 1 tablet (250 mg total) by mouth daily. Take first 2 tablets together, then 1 every day until finished. 07/12/21   Blue, Soijett A, PA-C  benzonatate (TESSALON) 100 MG capsule Take 1 capsule (100 mg total) by mouth 3 (three) times daily as needed for cough. 03/26/21   Long, Wonda Olds, MD  chlorhexidine (HIBICLENS) 4 % external liquid Apply topically daily as needed. 08/07/18   Loura Halt A, NP  cyclobenzaprine (FLEXERIL) 10 MG tablet Take 1 tablet (10 mg total) by mouth 2 (two) times  daily as needed for muscle spasms. 04/15/18   Wurst, [place], PA-C  erythromycin ophthalmic ointment Place a 1/2 inch ribbon of ointment into the lower eyelid 4-6 times per day 06/21/18   Wieters, Hallie C, PA-C  fluticasone (FLONASE) 50 MCG/ACT nasal spray Place 2 sprays into both nostrils daily for 7 days. 03/26/21 04/02/21  Long, Wonda Olds, MD  HUMIRA PEN 40 MG/0.8ML PNKT SMARTSIG:1 Pre-Filled Pen Syringe SUB-Q Once a Week 10/28/20   [provider]  mupirocin cream (BACTROBAN) 2 % Apply 1 application topically 2 (two) times daily. 08/07/18   Loura Halt A, NP  naproxen (NAPROSYN) 500 MG tablet Take 1 tablet (500 mg total) by mouth 2 (two) times daily. 04/15/18   Wurst, [place], PA-C      Allergies    Caffeine    Review of Systems   Review of Systems  Constitutional:  Positive for appetite change, chills, fatigue and fever.  HENT:  Negative for sore throat.   Eyes:  Negative for visual disturbance.  Respiratory:  Positive for cough.   Cardiovascular:  Positive for chest pain.  Gastrointestinal:  Positive for abdominal pain, diarrhea and nausea. Negative for vomiting.  Genitourinary:  Negative for dysuria.  Skin:  Negative for rash.  Neurological:  Positive for dizziness and light-headedness.    Physical Exam Updated Vital Signs BP 100/72   Pulse (!) 113   Temp (!) 101 F (38.3 C) (Oral)  Resp 20   Ht 5\' 4"  (1.626 m)   Wt (!) 174.6 kg   LMP 01/16/2022   SpO2 97%   BMI 66.09 kg/m  Physical Exam Vitals and nursing note reviewed.  Constitutional:      General: She is not in acute distress.    Appearance: Normal appearance. She is well-developed. She is obese.  HENT:     Head: Normocephalic and atraumatic.  Eyes:     Conjunctiva/sclera: Conjunctivae normal.  Cardiovascular:     Rate and Rhythm: Regular rhythm. Tachycardia present.     Heart sounds: No murmur heard. Pulmonary:     Effort: Pulmonary effort is normal. No respiratory distress.     Breath sounds:  Normal breath sounds.  Abdominal:     Palpations: Abdomen is soft.     Tenderness: There is no abdominal tenderness. There is no guarding or rebound.  Musculoskeletal:        General: Normal range of motion.     Cervical back: Neck supple.     Right lower leg: No edema.     Left lower leg: No edema.  Skin:    General: Skin is warm and dry.     Capillary Refill: Capillary refill takes less than 2 seconds.  Neurological:     General: No focal deficit present.     Mental Status: She is alert.     Sensory: No sensory deficit.     Motor: No weakness.     Gait: Gait normal.     ED Results / Procedures / Treatments   Labs (all labs ordered are listed, but only abnormal results are displayed) Labs Reviewed  COMPREHENSIVE METABOLIC PANEL - Abnormal; Notable for the following components:      Result Value   Potassium 3.2 (*)    CO2 20 (*)    Creatinine, Ser 1.60 (*)    Albumin 2.9 (*)    Total Bilirubin 1.7 (*)    GFR, Estimated 43 (*)    All other components within normal limits  CBC WITH DIFFERENTIAL/PLATELET - Abnormal; Notable for the following components:   WBC 16.6 (*)    Hemoglobin 10.1 (*)    HCT 33.0 (*)    MCH 24.6 (*)    RDW 17.1 (*)    Neutro Abs 15.4 (*)    Lymphs Abs 0.6 (*)    Abs Immature Granulocytes 0.13 (*)    All other components within normal limits  URINALYSIS, ROUTINE W REFLEX MICROSCOPIC - Abnormal; Notable for the following components:   Hgb urine dipstick MODERATE (*)    Leukocytes,Ua TRACE (*)    All other components within normal limits  URINALYSIS, MICROSCOPIC (REFLEX) - Abnormal; Notable for the following components:   Bacteria, UA MANY (*)    All other components within normal limits  SARS CORONAVIRUS 2 BY RT PCR  CULTURE, BLOOD (ROUTINE X 2)  CULTURE, BLOOD (ROUTINE X 2)  URINE CULTURE  LACTIC ACID, PLASMA  PREGNANCY, URINE  TROPONIN I (HIGH SENSITIVITY)  TROPONIN I (HIGH SENSITIVITY)    EKG EKG Interpretation  Date/Time:  Saturday  February 06 2022 13:26:34 EDT Ventricular Rate:  104 PR Interval:  134 QRS Duration: 74 QT Interval:  324 QTC Calculation: 426 R Axis:   64 Text Interpretation: Sinus tachycardia Otherwise normal ECG When compared with ECG of 21-Nov-2020 00:34, No significant change since last tracing Confirmed by Aletta Edouard [phone]) on 02/06/2022 1:31:22 PM  Radiology DG Chest 2 View  Result Date: 02/06/2022 CLINICAL DATA:  cough EXAM: CHEST - 2 VIEW COMPARISON:  Radiograph dated January 2023 FINDINGS: The cardiomediastinal silhouette is unchanged in contour. No pleural effusion. No pneumothorax. LEFT lateral hazy platelike opacity. Visualized abdomen is unremarkable. No acute osseous abnormality. IMPRESSION: LEFT lateral basilar hazy platelike opacity. Differential considerations include atelectasis, infection or aspiration. Electronically Signed   By: Meda Klinefelter M.D.   On: 02/06/2022 13:43    Procedures Procedures    Medications Ordered in ED Medications  cefTRIAXone (ROCEPHIN) 1 g in sodium chloride 0.9 % 100 mL IVPB (1 g Intravenous New Bag/Given 02/06/22 1723)  acetaminophen (TYLENOL) tablet 1,000 mg (1,000 mg Oral Given 02/06/22 1401)  sodium chloride 0.9 % bolus 1,000 mL (0 mLs Intravenous Stopped 02/06/22 1513)  sodium chloride 0.9 % bolus 1,000 mL (0 mLs Intravenous Stopped 02/06/22 1723)  iohexol (OMNIPAQUE) 300 MG/ML solution 80 mL (80 mLs Intravenous Contrast Given 02/06/22 1638)    ED Course/ Medical Decision Making/ A&P Clinical Course as of 02/06/22 1732  Sat Feb 06, 2022  1401 EKG did not cross in epic.  Sinus tachycardia nonspecific ST changes similar to prior. [MB]  1401 Chest x-ray interpreted by me as poor inspiration atelectasis left base cannot exclude infiltrate. [MB]    Clinical Course User Index [MB] Terrilee Files, MD                           Medical Decision Making Amount and/or Complexity of Data Reviewed Labs: ordered. Radiology: ordered.  Risk OTC  drugs. Prescription drug management.  WENDY HOBACK was evaluated in Emergency Department on 02/06/2022 for the symptoms described in the history of present illness. She was evaluated in the context of the global COVID-19 pandemic, which necessitated consideration that the patient might be at risk for infection with the SARS-CoV-2 virus that causes COVID-19. Institutional protocols and algorithms that pertain to the evaluation of patients at risk for COVID-19 are in a state of rapid change based on information released by regulatory bodies including the CDC and federal and state organizations. These policies and algorithms were followed during the patient's care in the ED.  This patient complains of fever cough nausea lower quadrant abdominal pain dizziness; this involves an extensive number of treatment Options and is a complaint that carries with it a high risk of complications and morbidity. The differential includes flu, COVID, dehydration, viral syndrome, pneumonia, diverticulitis, colitis, renal colic, pyelonephritis  I ordered, reviewed and interpreted labs, which included CBC with elevated white count, hemoglobin low stable from priors, chemistries with mildly low potassium elevated renal function, isolated elevated bilirubin, COVID-negative, urinalysis and pregnancy test pending blood culture sent, lactate normal I ordered medication IV fluids, oral Tylenol and reviewed PMP when indicated. I ordered imaging studies which included chest x-ray and CT abdomen and pelvis chest and I independently    visualized and interpreted imaging which showed atelectasis left base.  CT is pending at time of signout Previous records obtained and reviewed in epic no significant visits  Cardiac monitoring reviewed, sinus tachycardia Social determinants considered, no significant barriers Critical Interventions: None  After the interventions stated above, I reevaluated the patient and found patient still to  have soft blood pressures. Admission and further testing considered, her care is signed out to Dr. Rhunette Croft to follow-up on results of urinalysis and CTs.  If no acute findings and blood pressure stabilized can likely follow-up with PCP.          Final Clinical  Impression(s) / ED Diagnoses Final diagnoses:  Fever in adult  AKI (acute kidney injury) (HCC)  Hypokalemia    Rx / DC Orders ED Discharge Orders     None         Terrilee Files, MD 02/06/22 1735

## 2022-02-06 NOTE — Assessment & Plan Note (Signed)
Replete with IV mag. Repeat Mg in AM.

## 2022-02-06 NOTE — Consult Note (Signed)
Subjective: 1. Fever in adult   2. AKI (acute kidney injury) (HCC)   3. Hypokalemia   4. Kidney stone   5. Sepsis with acute renal failure without septic shock, due to unspecified organism, unspecified acute renal failure type [facility])      Consult requested by Dr. Carollee Herter.   Jill Odom is a 36 yo female who had the onset over the last 24 hours of constipation followed by a fever to 103 and some left flank pain.  She has no voiding complaints and has had no significant prior GU history.  She had a CT in the ER and was found to have an obstructing 1.1cm left proximal ureteral stone with infected appearing urine AKI and a leukocytosis.  She has chronic anemia.  ROS:  Review of Systems  Constitutional:  Positive for chills and fever.  Respiratory:  Positive for cough.   Cardiovascular:  Positive for chest pain.  Gastrointestinal:  Positive for constipation.  Genitourinary:  Positive for flank pain.  All other systems reviewed and are negative.   Allergies  Allergen Reactions   Caffeine Other (See Comments)    Itching and throat swelling.    Past Medical History:  Diagnosis Date   Allergy    Asthma    GERD (gastroesophageal reflux disease)     Past Surgical History:  Procedure Laterality Date   CESAREAN SECTION  12/06/2011   Procedure: CESAREAN SECTION;  Surgeon: Oliver Pila, MD;  Location: [facility];  Service: Gynecology;  Laterality: N/A;    Social History   Socioeconomic History   Marital status: Single    Spouse name: Not on file   Number of children: 1   Years of education: Not on file   Highest education level: Not on file  Occupational History   Occupation: SERVER    Employer: [employer]  Tobacco Use   Smoking status: Never   Smokeless tobacco: Never  Substance and Sexual Activity   Alcohol use: Yes   Drug use: No   Sexual activity: Yes    Birth control/protection: None  Other Topics Concern   Not on file  Social History Narrative   Not on file   Social  Determinants of Health   Financial Resource Strain: Not on file  Food Insecurity: Not on file  Transportation Needs: Not on file  Physical Activity: Not on file  Stress: Not on file  Social Connections: Not on file  Intimate Partner Violence: Not on file    Family History  Problem Relation Age of Onset   Asthma Father    Hypertension Mother    Hypertension Sister    Hypertension Maternal Grandmother    Hypertension Maternal Grandfather    Hypertension Paternal Grandmother    Hypertension Paternal Grandfather     Anti-infectives: Anti-infectives (From admission, onward)    Start     Dose/Rate Route Frequency Ordered Stop   02/06/22 1815  cefTRIAXone (ROCEPHIN) 2 g in sodium chloride 0.9 % 100 mL IVPB        2 g 200 mL/hr over 30 Minutes Intravenous  Once 02/06/22 1806 02/06/22 1913   02/06/22 1715  cefTRIAXone (ROCEPHIN) 1 g in sodium chloride 0.9 % 100 mL IVPB        1 g 200 mL/hr over 30 Minutes Intravenous  Once 02/06/22 1714 02/06/22 1757       No current facility-administered medications for this encounter.     Objective: Vital signs in last 24 hours: BP 127/79   Pulse Marland Kitchen)  122   Temp 98.3 F (36.8 C)   Resp (!) 37   Ht 5\' 4"  (1.626 m)   Wt (!) 174.6 kg   LMP 01/16/2022   SpO2 100%   BMI 66.09 kg/m   Intake/Output from previous day: No intake/output data recorded. Intake/Output this shift: No intake/output data recorded.   Physical Exam Constitutional:      Appearance: Normal appearance. She is obese.  HENT:     Head: Normocephalic and atraumatic.  Cardiovascular:     Rate and Rhythm: Regular rhythm. Tachycardia present.     Heart sounds: Normal heart sounds.  Pulmonary:     Effort: Pulmonary effort is normal. No respiratory distress.     Breath sounds: Normal breath sounds.  Abdominal:     Palpations: Abdomen is soft.     Tenderness: There is left CVA tenderness.  Musculoskeletal:        General: No swelling or tenderness. Normal range of  motion.  Skin:    General: Skin is warm and dry.  Neurological:     General: No focal deficit present.     Mental Status: She is alert and oriented to person, place, and time.  Psychiatric:        Mood and Affect: Mood normal.        Behavior: Behavior normal.     Lab Results:  Results for orders placed or performed during the hospital encounter of 02/06/22 (from the past 24 hour(s))  SARS Coronavirus 2 by RT PCR (hospital order, performed in [facility] hospital lab) *cepheid single result test* Anterior Nasal Swab     Status: None   Collection Time: 02/06/22  1:44 PM   Specimen: Anterior Nasal Swab  Result Value Ref Range   SARS Coronavirus 2 by RT PCR NEGATIVE NEGATIVE  Lactic acid, plasma     Status: None   Collection Time: 02/06/22  1:44 PM  Result Value Ref Range   Lactic Acid, Venous 1.9 0.5 - 1.9 mmol/L  Comprehensive metabolic panel     Status: Abnormal   Collection Time: 02/06/22  1:44 PM  Result Value Ref Range   Sodium 135 135 - 145 mmol/L   Potassium 3.2 (L) 3.5 - 5.1 mmol/L   Chloride 108 98 - 111 mmol/L   CO2 20 (L) 22 - 32 mmol/L   Glucose, Bld 92 70 - 99 mg/dL   BUN 13 6 - 20 mg/dL   Creatinine, Ser 04/08/22 (H) 0.44 - 1.00 mg/dL   Calcium 9.9 8.9 - 5.36 mg/dL   Total Protein 8.1 6.5 - 8.1 g/dL   Albumin 2.9 (L) 3.5 - 5.0 g/dL   AST 32 15 - 41 U/L   ALT 16 0 - 44 U/L   Alkaline Phosphatase 60 38 - 126 U/L   Total Bilirubin 1.7 (H) 0.3 - 1.2 mg/dL   GFR, Estimated 43 (L) >60 mL/min   Anion gap 7 5 - 15  CBC with Differential     Status: Abnormal   Collection Time: 02/06/22  1:44 PM  Result Value Ref Range   WBC 16.6 (H) 4.0 - 10.5 K/uL   RBC 4.11 3.87 - 5.11 MIL/uL   Hemoglobin 10.1 (L) 12.0 - 15.0 g/dL   HCT 04/08/22 (L) 03.4 - 74.2 %   MCV 80.3 80.0 - 100.0 fL   MCH 24.6 (L) 26.0 - 34.0 pg   MCHC 30.6 30.0 - 36.0 g/dL   RDW 59.5 (H) 63.8 - 75.6 %   Platelets 313 150 -  400 K/uL   nRBC 0.0 0.0 - 0.2 %   Neutrophils Relative % 93 %   Neutro Abs 15.4 (H)  1.7 - 7.7 K/uL   Lymphocytes Relative 4 %   Lymphs Abs 0.6 (L) 0.7 - 4.0 K/uL   Monocytes Relative 2 %   Monocytes Absolute 0.4 0.1 - 1.0 K/uL   Eosinophils Relative 0 %   Eosinophils Absolute 0.0 0.0 - 0.5 K/uL   Basophils Relative 0 %   Basophils Absolute 0.0 0.0 - 0.1 K/uL   Immature Granulocytes 1 %   Abs Immature Granulocytes 0.13 (H) 0.00 - 0.07 K/uL  Troponin I (High Sensitivity)     Status: None   Collection Time: 02/06/22  1:44 PM  Result Value Ref Range   Troponin I (High Sensitivity) 7 <18 ng/L  CK     Status: None   Collection Time: 02/06/22  1:44 PM  Result Value Ref Range   Total CK 141 38 - 234 U/L  Phosphorus     Status: Abnormal   Collection Time: 02/06/22  1:44 PM  Result Value Ref Range   Phosphorus 1.2 (L) 2.5 - 4.6 mg/dL  Magnesium     Status: Abnormal   Collection Time: 02/06/22  1:44 PM  Result Value Ref Range   Magnesium 1.6 (L) 1.7 - 2.4 mg/dL  Urinalysis, Routine w reflex microscopic Urine, Clean Catch     Status: Abnormal   Collection Time: 02/06/22  3:44 PM  Result Value Ref Range   Color, Urine YELLOW YELLOW   APPearance CLEAR CLEAR   Specific Gravity, Urine <=1.005 1.005 - 1.030   pH 5.5 5.0 - 8.0   Glucose, UA NEGATIVE NEGATIVE mg/dL   Hgb urine dipstick MODERATE (A) NEGATIVE   Bilirubin Urine NEGATIVE NEGATIVE   Ketones, ur NEGATIVE NEGATIVE mg/dL   Protein, ur NEGATIVE NEGATIVE mg/dL   Nitrite NEGATIVE NEGATIVE   Leukocytes,Ua TRACE (A) NEGATIVE  Pregnancy, urine     Status: None   Collection Time: 02/06/22  3:44 PM  Result Value Ref Range   Preg Test, Ur NEGATIVE NEGATIVE  Urinalysis, Microscopic (reflex)     Status: Abnormal   Collection Time: 02/06/22  3:44 PM  Result Value Ref Range   RBC / HPF 0-5 0 - 5 RBC/hpf   WBC, UA 0-5 0 - 5 WBC/hpf   Bacteria, UA MANY (A) NONE SEEN   Squamous Epithelial / LPF 0-5 0 - 5  Troponin I (High Sensitivity)     Status: None   Collection Time: 02/06/22  3:57 PM  Result Value Ref Range    Troponin I (High Sensitivity) 6 <18 ng/L    BMET Recent Labs    02/06/22 1344  NA 135  K 3.2*  CL 108  CO2 20*  GLUCOSE 92  BUN 13  CREATININE 1.60*  CALCIUM 9.9   PT/INR No results for input(s): "LABPROT", "INR" in the last 72 hours. ABG No results for input(s): "PHART", "HCO3" in the last 72 hours.  Invalid input(s): "PCO2", "PO2"  Studies/Results: CT CHEST ABDOMEN PELVIS W CONTRAST  Result Date: 02/06/2022 CLINICAL DATA:  Sepsis. Left lower quadrant pain. Cough fever and chills. EXAM: CT CHEST, ABDOMEN, AND PELVIS WITH CONTRAST TECHNIQUE: Multidetector CT imaging of the chest, abdomen and pelvis was performed following the standard protocol during bolus administration of intravenous contrast. RADIATION DOSE REDUCTION: This exam was performed according to the departmental dose-optimization program which includes automated exposure control, adjustment of the mA and/or kV according to patient  size and/or use of iterative reconstruction technique. CONTRAST:  32mL OMNIPAQUE IOHEXOL 300 MG/ML  SOLN COMPARISON:  December 12, 2011 FINDINGS: CT CHEST FINDINGS Cardiovascular: No significant vascular findings. Normal heart size. No pericardial effusion. Mediastinum/Nodes: No enlarged mediastinal, hilar, or axillary lymph nodes. Thyroid gland, trachea, and esophagus demonstrate no significant findings. Lungs/Pleura: Lungs are clear. No pleural effusion or pneumothorax. Mild atelectasis in the lingula. Musculoskeletal: No chest wall mass or suspicious bone lesions identified. CT ABDOMEN PELVIS FINDINGS Hepatobiliary: No focal liver abnormality is seen. No gallstones, gallbladder wall thickening, or biliary dilatation. Pancreas: Unremarkable. No pancreatic ductal dilatation or surrounding inflammatory changes. Spleen: Normal in size without focal abnormality. Adrenals/Urinary Tract: Normal adrenal glands. Normal right kidney, right ureter and urinary bladder. There is an obstructive 1.1 cm proximal left  ureteral calculus with moderate left hydronephrosis and proximal hydroureter. There is significant left perinephric fat stranding. Stomach/Bowel: Stomach is within normal limits. Appendix appears normal. No evidence of bowel wall thickening, distention, or inflammatory changes. Vascular/Lymphatic: No significant vascular findings are present. No enlarged abdominal or pelvic lymph nodes. Reproductive: Uterus and bilateral adnexa are unremarkable. Other: No abdominal wall hernia or abnormality. No abdominopelvic ascites. Musculoskeletal: No acute or significant osseous findings. IMPRESSION: Obstructing 1.1 cm proximal left ureteral calculus with moderate left hydronephrosis and proximal hydroureter. Significant left perinephric inflammatory fat stranding. Atelectasis in the lingula. Electronically Signed   By: Ted Mcalpine M.D.   On: 02/06/2022 17:05   DG Chest 2 View  Result Date: 02/06/2022 CLINICAL DATA:  cough EXAM: CHEST - 2 VIEW COMPARISON:  Radiograph dated January 2023 FINDINGS: The cardiomediastinal silhouette is unchanged in contour. No pleural effusion. No pneumothorax. LEFT lateral hazy platelike opacity. Visualized abdomen is unremarkable. No acute osseous abnormality. IMPRESSION: LEFT lateral basilar hazy platelike opacity. Differential considerations include atelectasis, infection or aspiration. Electronically Signed   By: Meda Klinefelter M.D.   On: 02/06/2022 13:43     Assessment/Plan: Left proximal ureteral stone with obstruction and sepsis with AKI.  I will take her to the OR for cystoscopy and left stent insertion.  I have reviewed the risks of bleeding, infection, ureteral injury, need for secondary procedures, thrombotic events, stent irritation and anesthetic complications.         No follow-ups on file.    CC: Dr. Carollee Herter.      Bjorn Pippin 02/06/2022 [phone]

## 2022-02-06 NOTE — H&P (Signed)
History and Physical    Jill Odom NIO:[id number] DOB: 1986/04/05 DOA: 02/06/2022  DOS: the patient was seen and examined on 02/06/2022  PCP: Verlon Au, MD   Patient coming from: Home  I have personally briefly reviewed patient's old medical records in [facility]  CC: abd pain, fever HPI: 36 year old African-American female history of morbid obesity BMI of 66, takes no medications.  No significant medical history presents to the hospital with a 2-day history of fever and back pain.  Fever 102 at home.  Has never had a kidney stone before.  Came to ER for evaluation.  On arrival to the ER, temp 101.0, heart rate 113 blood pressure 100/72.  Labs showed a white count of 16.6, hemoglobin 10.1, platelets of 313  Chemistry showed a BUN of 13, creatinine 1.6  UA showed many bacteria  CT chest abdomen pelvis demonstrated obstructing 1.1 cm left ureteral stone with moderate left hydronephrosis and proximal hydroureter.  There is significant left perinephric inflammatory stranding.  Patient transferred to [facility] per urology.  [facility] hospitalist contacted for admission.   ED Course: CT showed obstructing left ureteral stone.  Review of Systems:  Review of Systems  Constitutional:  Positive for chills and fever.  HENT: Negative.    Respiratory: Negative.    Cardiovascular: Negative.   Gastrointestinal:  Positive for abdominal pain and nausea.  Genitourinary:  Positive for flank pain.  Musculoskeletal:  Positive for back pain.  Skin: Negative.   Neurological: Negative.   Endo/Heme/Allergies: Negative.   Psychiatric/Behavioral: Negative.    All other systems reviewed and are negative.   Past Medical History:  Diagnosis Date   Allergy    Asthma    GERD (gastroesophageal reflux disease)     Past Surgical History:  Procedure Laterality Date   CESAREAN SECTION  12/06/2011   Procedure: CESAREAN SECTION;  Surgeon: Oliver Pila, MD;  Location: [facility];  Service: Gynecology;  Laterality: N/A;     reports that she has never smoked. She has never used smokeless tobacco. She reports current alcohol use. She reports that she does not use drugs.  Allergies  Allergen Reactions   Caffeine Other (See Comments)    Itching and throat swelling.    Family History  Problem Relation Age of Onset   Asthma Father    Hypertension Mother    Hypertension Sister    Hypertension Maternal Grandmother    Hypertension Maternal Grandfather    Hypertension Paternal Grandmother    Hypertension Paternal Grandfather     Prior to Admission medications   Medication Sig Start Date End Date Taking? Authorizing Provider  albuterol (VENTOLIN HFA) 108 (90 Base) MCG/ACT inhaler Inhale 1-2 puffs into the lungs every 6 (six) hours as needed for wheezing or shortness of breath. 03/26/21   Long, Arlyss Repress, MD  azithromycin (ZITHROMAX) 250 MG tablet Take 1 tablet (250 mg total) by mouth daily. Take first 2 tablets together, then 1 every day until finished. 07/12/21   Blue, Soijett A, PA-C  benzonatate (TESSALON) 100 MG capsule Take 1 capsule (100 mg total) by mouth 3 (three) times daily as needed for cough. 03/26/21   Long, Arlyss Repress, MD  chlorhexidine (HIBICLENS) 4 % external liquid Apply topically daily as needed. 08/07/18   Dahlia Byes A, NP  cyclobenzaprine (FLEXERIL) 10 MG tablet Take 1 tablet (10 mg total) by mouth 2 (two) times daily as needed for muscle spasms. 04/15/18   Wurst, [place], PA-C  erythromycin ophthalmic ointment  Place a 1/2 inch ribbon of ointment into the lower eyelid 4-6 times per day 06/21/18   Wieters, Hallie C, PA-C  fluticasone (FLONASE) 50 MCG/ACT nasal spray Place 2 sprays into both nostrils daily for 7 days. 03/26/21 04/02/21  Long, Arlyss Repress, MD  HUMIRA PEN 40 MG/0.8ML PNKT SMARTSIG:1 Pre-Filled Pen Syringe SUB-Q Once a Week 10/28/20   [provider]  mupirocin cream (BACTROBAN) 2 % Apply 1 application topically 2 (two) times daily. 08/07/18    Dahlia Byes A, NP  naproxen (NAPROSYN) 500 MG tablet Take 1 tablet (500 mg total) by mouth 2 (two) times daily. 04/15/18   Rennis Harding, PA-C    Physical Exam: Vitals:   02/06/22 2018 02/06/22 2128 02/06/22 2130 02/06/22 2134  BP:  (!) 81/64 106/74 127/79  Pulse: (!) 122     Resp:  (!) 25 (!) 32 (!) 37  Temp:  98.3 F (36.8 C)    TempSrc:      SpO2: 100% 100%    Weight:      Height:        Physical Exam Vitals and nursing note reviewed.  Constitutional:      General: She is not in acute distress.    Appearance: She is obese. She is not ill-appearing, toxic-appearing or diaphoretic.  HENT:     Head: Normocephalic and atraumatic.     Nose: Nose normal.  Eyes:     General: No scleral icterus. Cardiovascular:     Rate and Rhythm: Regular rhythm. Tachycardia present.     Pulses: Normal pulses.  Pulmonary:     Effort: Pulmonary effort is normal. No respiratory distress.     Breath sounds: No wheezing or rales.  Abdominal:     General: Bowel sounds are normal. There is no distension.     Tenderness: There is no abdominal tenderness. There is no guarding or rebound.  Musculoskeletal:     Right lower leg: No edema.     Left lower leg: No edema.  Skin:    General: Skin is warm and dry.     Capillary Refill: Capillary refill takes less than 2 seconds.  Neurological:     General: No focal deficit present.     Mental Status: She is alert and oriented to person, place, and time.      Labs on Admission: I have personally reviewed following labs and imaging studies  CBC: Recent Labs  Lab 02/06/22 1344  WBC 16.6*  NEUTROABS 15.4*  HGB 10.1*  HCT 33.0*  MCV 80.3  PLT 313   Basic Metabolic Panel: Recent Labs  Lab 02/06/22 1344  NA 135  K 3.2*  CL 108  CO2 20*  GLUCOSE 92  BUN 13  CREATININE 1.60*  CALCIUM 9.9  MG 1.6*  PHOS 1.2*   GFR: Estimated Creatinine Clearance: 79.6 mL/min (A) (by C-G formula based on SCr of 1.6 mg/dL (H)). Liver Function  Tests: Recent Labs  Lab 02/06/22 1344  AST 32  ALT 16  ALKPHOS 60  BILITOT 1.7*  PROT 8.1  ALBUMIN 2.9*   No results for input(s): "LIPASE", "AMYLASE" in the last 168 hours. No results for input(s): "AMMONIA" in the last 168 hours. Coagulation Profile: No results for input(s): "INR", "PROTIME" in the last 168 hours. Cardiac Enzymes: Recent Labs  Lab 02/06/22 1344 02/06/22 1557  CKTOTAL 141  --   TROPONINIHS 7 6   BNP (last 3 results) No results for input(s): "PROBNP" in the last 8760 hours. HbA1C: No results  for input(s): "HGBA1C" in the last 72 hours. CBG: No results for input(s): "GLUCAP" in the last 168 hours. Lipid Profile: No results for input(s): "CHOL", "HDL", "LDLCALC", "TRIG", "CHOLHDL", "LDLDIRECT" in the last 72 hours. Thyroid Function Tests: No results for input(s): "TSH", "T4TOTAL", "FREET4", "T3FREE", "THYROIDAB" in the last 72 hours. Anemia Panel: No results for input(s): "VITAMINB12", "FOLATE", "FERRITIN", "TIBC", "IRON", "RETICCTPCT" in the last 72 hours. Urine analysis:    Component Value Date/Time   COLORURINE YELLOW 02/06/2022 1544   APPEARANCEUR CLEAR 02/06/2022 1544   LABSPEC <=1.005 02/06/2022 1544   PHURINE 5.5 02/06/2022 1544   GLUCOSEU NEGATIVE 02/06/2022 1544   HGBUR MODERATE (A) 02/06/2022 1544   BILIRUBINUR NEGATIVE 02/06/2022 1544   BILIRUBINUR neg 05/03/2017 1446   KETONESUR NEGATIVE 02/06/2022 1544   PROTEINUR NEGATIVE 02/06/2022 1544   UROBILINOGEN 0.2 06/21/2018 0841   NITRITE NEGATIVE 02/06/2022 1544   LEUKOCYTESUR TRACE (A) 02/06/2022 1544    Radiological Exams on Admission: I have personally reviewed images CT CHEST ABDOMEN PELVIS W CONTRAST  Result Date: 02/06/2022 CLINICAL DATA:  Sepsis. Left lower quadrant pain. Cough fever and chills. EXAM: CT CHEST, ABDOMEN, AND PELVIS WITH CONTRAST TECHNIQUE: Multidetector CT imaging of the chest, abdomen and pelvis was performed following the standard protocol during bolus  administration of intravenous contrast. RADIATION DOSE REDUCTION: This exam was performed according to the departmental dose-optimization program which includes automated exposure control, adjustment of the mA and/or kV according to patient size and/or use of iterative reconstruction technique. CONTRAST:  73mL OMNIPAQUE IOHEXOL 300 MG/ML  SOLN COMPARISON:  December 12, 2011 FINDINGS: CT CHEST FINDINGS Cardiovascular: No significant vascular findings. Normal heart size. No pericardial effusion. Mediastinum/Nodes: No enlarged mediastinal, hilar, or axillary lymph nodes. Thyroid gland, trachea, and esophagus demonstrate no significant findings. Lungs/Pleura: Lungs are clear. No pleural effusion or pneumothorax. Mild atelectasis in the lingula. Musculoskeletal: No chest wall mass or suspicious bone lesions identified. CT ABDOMEN PELVIS FINDINGS Hepatobiliary: No focal liver abnormality is seen. No gallstones, gallbladder wall thickening, or biliary dilatation. Pancreas: Unremarkable. No pancreatic ductal dilatation or surrounding inflammatory changes. Spleen: Normal in size without focal abnormality. Adrenals/Urinary Tract: Normal adrenal glands. Normal right kidney, right ureter and urinary bladder. There is an obstructive 1.1 cm proximal left ureteral calculus with moderate left hydronephrosis and proximal hydroureter. There is significant left perinephric fat stranding. Stomach/Bowel: Stomach is within normal limits. Appendix appears normal. No evidence of bowel wall thickening, distention, or inflammatory changes. Vascular/Lymphatic: No significant vascular findings are present. No enlarged abdominal or pelvic lymph nodes. Reproductive: Uterus and bilateral adnexa are unremarkable. Other: No abdominal wall hernia or abnormality. No abdominopelvic ascites. Musculoskeletal: No acute or significant osseous findings. IMPRESSION: Obstructing 1.1 cm proximal left ureteral calculus with moderate left hydronephrosis and proximal  hydroureter. Significant left perinephric inflammatory fat stranding. Atelectasis in the lingula. Electronically Signed   By: Ted Mcalpine M.D.   On: 02/06/2022 17:05   DG Chest 2 View  Result Date: 02/06/2022 CLINICAL DATA:  cough EXAM: CHEST - 2 VIEW COMPARISON:  Radiograph dated January 2023 FINDINGS: The cardiomediastinal silhouette is unchanged in contour. No pleural effusion. No pneumothorax. LEFT lateral hazy platelike opacity. Visualized abdomen is unremarkable. No acute osseous abnormality. IMPRESSION: LEFT lateral basilar hazy platelike opacity. Differential considerations include atelectasis, infection or aspiration. Electronically Signed   By: Meda Klinefelter M.D.   On: 02/06/2022 13:43    EKG: My personal interpretation of EKG shows: sinus tachycardia    Assessment/Plan Principal Problem:   Left nephrolithiasis Active  Problems:   Sepsis with acute organ dysfunction without septic shock (HCC)   Hydronephrosis of left kidney   AKI (acute kidney injury) (HCC)   Morbid obesity with BMI of 60.0-69.9, adult (HCC)   Hypokalemia   Hypomagnesemia   Pyelonephritis of left kidney    Assessment and Plan: * Left nephrolithiasis Admit to inpatient telemetry. Continue with IV Rocephin 2 gram qday with presumed pyelonephritis from infected stone.  Awaiting blood and urine cx. Going to OR tonight with urology.  AKI (acute kidney injury) (HCC) Continue with IVF. Hold nephrotoxic agent. Repeat CMP in AM.  Hydronephrosis of left kidney Due to kidney stone. Management per urology.  Sepsis with acute organ dysfunction without septic shock (HCC) Due to obstructing kidney stone and presumed infection. Has AKI. Continue with IVF. Repeat CBC and CMP in AM.  Morbid obesity with BMI of 60.0-69.9, adult (HCC) Weight 174.6 kg. BMI 66.09  Pyelonephritis of left kidney Due to infected stone. Continue IV Rocephin  Hypomagnesemia Replete with IV mag. Repeat Mg in  AM.  Hypokalemia Replete with IV kcl. Repeat bmp in AM.   DVT prophylaxis: SQ Heparin Code Status: Full Code Family Communication: no family at bedside  Disposition Plan: return home  Consults called: edp contacted urology  Admission status: Inpatient, Telemetry bed   Carollee Herter, DO [facility] 02/06/2022, 9:40 PM

## 2022-02-06 NOTE — Assessment & Plan Note (Signed)
Due to obstructing kidney stone and presumed infection. Has AKI. Continue with IVF. Repeat CBC and CMP in AM.

## 2022-02-06 NOTE — Assessment & Plan Note (Signed)
Replete with IV kcl. Repeat bmp in AM.

## 2022-02-06 NOTE — Anesthesia Postprocedure Evaluation (Signed)
Anesthesia Post Note  Patient: Jill Odom  Procedure(s) Performed: CYSTOSCOPY WITH STENT PLACEMENT (Left)     Patient location during evaluation: PACU Anesthesia Type: General Level of consciousness: awake and alert Pain management: pain level controlled Vital Signs Assessment: post-procedure vital signs reviewed and stable Respiratory status: spontaneous breathing, nonlabored ventilation, respiratory function stable and patient connected to nasal cannula oxygen Cardiovascular status: blood pressure returned to baseline and stable Postop Assessment: no apparent nausea or vomiting Anesthetic complications: no   No notable events documented.  Last Vitals:  Vitals:   02/06/22 2245 02/06/22 2300  BP: (!) 104/55 104/72  Pulse: (!) 128   Resp: (!) 24 (!) 25  Temp: (!) 38.8 C 37.3 C  SpO2: 95% 97%    Last Pain:  Vitals:   02/06/22 2300  TempSrc:   PainSc: 0-No pain                 Trevor Iha

## 2022-02-06 NOTE — Subjective & Objective (Signed)
CC: abd pain, fever HPI: 36 year old African-American female history of morbid obesity BMI of 66, takes no medications.  No significant medical history presents to the hospital with a 2-day history of fever and back pain.  Fever 102 at home.  Has never had a kidney stone before.  Came to ER for evaluation.  On arrival to the ER, temp 101.0, heart rate 113 blood pressure 100/72.  Labs showed a white count of 16.6, hemoglobin 10.1, platelets of 313  Chemistry showed a BUN of 13, creatinine 1.6  UA showed many bacteria  CT chest abdomen pelvis demonstrated obstructing 1.1 cm left ureteral stone with moderate left hydronephrosis and proximal hydroureter.  There is significant left perinephric inflammatory stranding.  Patient transferred to [facility] per urology.  [facility] hospitalist contacted for admission.

## 2022-02-06 NOTE — Assessment & Plan Note (Signed)
Due to kidney stone. Management per urology.

## 2022-02-06 NOTE — Op Note (Signed)
Procedure: 1. Cystoscopy with left RTG pyelogram and interpretation.  2. Cystoscopy with left ureteral stent insertion. 3. Application of fluoroscopy.  Preop Dx: Left proximal stone with sepsis.  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  Surgeon: Dr. Bjorn Pippin  Anesthesia: Generally.  Specimen: Urine culture.  Drain: 86fr x 24cm left JJ stent and foley.  EBL: none.  Complications: None.  Indications: 36 yo female with a 1.1 cm obstructing left proximal stone with sepsis.  Procedure: She was taken to the operating room and a general anesthetic was induced.  She had received Rocephin in the ER.  She was placed in the lithotomy position and fitted with PAS hose.   She was prepped with betadine and draped in the usual sterile fashion.  Cystoscopy was performed with the 79fr scope.  The bladder mucosa had diffuse pink nodules consistent with follicular cystitis.  The UO's were normal.    The left UO was canulated with a 67fr open end catheter and contrast was instilled.  The left retrograde pyelogram demonstrated and normal ureter with a filling defect in the proximal ureter consistent with the stone.  A sensor wire was advanced to the kidney and a 5fr X 24cm stent was passed without difficulty to the kidney.  The wire was removed, leaving the stent coils in good position.   Pus was noted to efflux from the stent ports and a specimen of urine was obtained for culture.   The cystoscopy was removed and a 60fr foley was placed.  The balloon was filled with 75ml and the catheter was placed to straight drainage.  She was taken down from the lithotomy postion, her anesthetic was reversed and she was moved to the recovery room in stable condition.  There were no complications.

## 2022-02-07 ENCOUNTER — Encounter (HOSPITAL_COMMUNITY): Payer: Self-pay | Admitting: Urology

## 2022-02-07 LAB — BLOOD CULTURE ID PANEL (REFLEXED) - BCID2

## 2022-02-07 LAB — CBC WITH DIFFERENTIAL/PLATELET
Abs Immature Granulocytes: 0.59 10*3/uL — ABNORMAL HIGH (ref 0.00–0.07)
Basophils Absolute: 0.1 10*3/uL (ref 0.0–0.1)
Basophils Relative: 0 %
Eosinophils Absolute: 0 10*3/uL (ref 0.0–0.5)
Eosinophils Relative: 0 %
HCT: 31.4 % — ABNORMAL LOW (ref 36.0–46.0)
Hemoglobin: 9.3 g/dL — ABNORMAL LOW (ref 12.0–15.0)
Immature Granulocytes: 2 %
Lymphocytes Relative: 1 %
Lymphs Abs: 0.3 10*3/uL — ABNORMAL LOW (ref 0.7–4.0)
MCH: 24.6 pg — ABNORMAL LOW (ref 26.0–34.0)
MCHC: 29.6 g/dL — ABNORMAL LOW (ref 30.0–36.0)
MCV: 83.1 fL (ref 80.0–100.0)
Monocytes Absolute: 0.3 10*3/uL (ref 0.1–1.0)
Monocytes Relative: 1 %
Neutro Abs: 24 10*3/uL — ABNORMAL HIGH (ref 1.7–7.7)
Neutrophils Relative %: 96 %
Platelets: 244 10*3/uL (ref 150–400)
RBC: 3.78 MIL/uL — ABNORMAL LOW (ref 3.87–5.11)
RDW: 17.5 % — ABNORMAL HIGH (ref 11.5–15.5)
WBC: 25.3 10*3/uL — ABNORMAL HIGH (ref 4.0–10.5)
nRBC: 0 % (ref 0.0–0.2)

## 2022-02-07 LAB — COMPREHENSIVE METABOLIC PANEL
ALT: 30 U/L (ref 0–44)
AST: 64 U/L — ABNORMAL HIGH (ref 15–41)
Albumin: 2.6 g/dL — ABNORMAL LOW (ref 3.5–5.0)
Alkaline Phosphatase: 52 U/L (ref 38–126)
Anion gap: 7 (ref 5–15)
BUN: 13 mg/dL (ref 6–20)
CO2: 19 mmol/L — ABNORMAL LOW (ref 22–32)
Calcium: 10 mg/dL (ref 8.9–10.3)
Chloride: 112 mmol/L — ABNORMAL HIGH (ref 98–111)
Creatinine, Ser: 1.41 mg/dL — ABNORMAL HIGH (ref 0.44–1.00)
GFR, Estimated: 50 mL/min — ABNORMAL LOW (ref >60)
Glucose, Bld: 75 mg/dL (ref 70–99)
Potassium: 4 mmol/L (ref 3.5–5.1)
Sodium: 138 mmol/L (ref 135–145)
Total Bilirubin: 2.3 mg/dL — ABNORMAL HIGH (ref 0.3–1.2)
Total Protein: 7.2 g/dL (ref 6.5–8.1)

## 2022-02-07 LAB — MAGNESIUM: Magnesium: 2.1 mg/dL (ref 1.7–2.4)

## 2022-02-07 LAB — HEMOGLOBIN A1C
Hgb A1c MFr Bld: 5.8 % — ABNORMAL HIGH (ref 4.8–5.6)
Mean Plasma Glucose: 119.76 mg/dL

## 2022-02-07 LAB — HIV ANTIBODY (ROUTINE TESTING W REFLEX): HIV Screen 4th Generation wRfx: NONREACTIVE

## 2022-02-07 MED ORDER — ACETAMINOPHEN 650 MG RE SUPP: 650.0000 mg | Freq: Four times a day (QID) | RECTAL | Status: DC | PRN

## 2022-02-07 MED ORDER — IPRATROPIUM-ALBUTEROL 0.5-2.5 (3) MG/3ML IN SOLN: 3.0000 mL | RESPIRATORY_TRACT | Status: DC | PRN

## 2022-02-07 MED ORDER — POTASSIUM CHLORIDE 10 MEQ/100ML IV SOLN
10.0000 meq | INTRAVENOUS | Status: AC
  Administered 2022-02-07 (×3): 10 meq via INTRAVENOUS
  Filled 2022-02-07 (×3): qty 100

## 2022-02-07 MED ORDER — HEPARIN SODIUM (PORCINE) 5000 UNIT/ML IJ SOLN
5000.0000 [IU] | Freq: Three times a day (TID) | INTRAMUSCULAR | Status: DC
  Administered 2022-02-07 – 2022-02-09 (×7): 5000 [IU] via SUBCUTANEOUS
  Filled 2022-02-07 (×7): qty 1

## 2022-02-07 MED ORDER — SODIUM CHLORIDE 0.9 % IV SOLN
2.0000 g | INTRAVENOUS | Status: DC
  Administered 2022-02-07: 2 g via INTRAVENOUS
  Filled 2022-02-07: qty 20

## 2022-02-07 MED ORDER — SENNOSIDES-DOCUSATE SODIUM 8.6-50 MG PO TABS: 1.0000 | ORAL_TABLET | Freq: Every evening | ORAL | Status: DC | PRN

## 2022-02-07 MED ORDER — K PHOS MONO-SOD PHOS DI & MONO 155-852-130 MG PO TABS
500.0000 mg | ORAL_TABLET | Freq: Three times a day (TID) | ORAL | Status: DC
Start: 2022-02-07 — End: 2022-02-09
  Administered 2022-02-07 – 2022-02-09 (×7): 500 mg via ORAL
  Filled 2022-02-07 (×9): qty 2

## 2022-02-07 MED ORDER — GUAIFENESIN 100 MG/5ML PO LIQD: 5.0000 mL | ORAL | Status: DC | PRN

## 2022-02-07 MED ORDER — ACETAMINOPHEN 325 MG PO TABS
650.0000 mg | ORAL_TABLET | Freq: Four times a day (QID) | ORAL | Status: DC | PRN
  Administered 2022-02-07 – 2022-02-08 (×2): 650 mg via ORAL
  Filled 2022-02-07 (×2): qty 2

## 2022-02-07 MED ORDER — SODIUM CHLORIDE 0.9 % IV SOLN: INTRAVENOUS | Status: DC

## 2022-02-07 MED ORDER — ONDANSETRON HCL 4 MG PO TABS: 4.0000 mg | ORAL_TABLET | Freq: Four times a day (QID) | ORAL | Status: DC | PRN

## 2022-02-07 MED ORDER — SODIUM CHLORIDE 0.9 % IV SOLN
2.0000 g | Freq: Three times a day (TID) | INTRAVENOUS | Status: DC
  Administered 2022-02-07 – 2022-02-09 (×5): 2 g via INTRAVENOUS
  Filled 2022-02-07 (×5): qty 12.5

## 2022-02-07 MED ORDER — METOPROLOL TARTRATE 5 MG/5ML IV SOLN: 5.0000 mg | INTRAVENOUS | Status: DC | PRN

## 2022-02-07 MED ORDER — LACTATED RINGERS IV SOLN: INTRAVENOUS | Status: AC

## 2022-02-07 MED ORDER — ONDANSETRON HCL 4 MG/2ML IJ SOLN: 4.0000 mg | Freq: Four times a day (QID) | INTRAMUSCULAR | Status: DC | PRN

## 2022-02-07 MED ORDER — HYDRALAZINE HCL 20 MG/ML IJ SOLN: 10.0000 mg | INTRAMUSCULAR | Status: DC | PRN

## 2022-02-07 MED ORDER — TRAZODONE HCL 50 MG PO TABS: 50.0000 mg | ORAL_TABLET | Freq: Every evening | ORAL | Status: DC | PRN

## 2022-02-07 MED ORDER — MAGNESIUM SULFATE 2 GM/50ML IV SOLN
2.0000 g | Freq: Once | INTRAVENOUS | Status: AC
  Administered 2022-02-07: 2 g via INTRAVENOUS
  Filled 2022-02-07: qty 50

## 2022-02-07 NOTE — Progress Notes (Signed)
1 Day Post-Op  Subjective: Jill Odom is resting comfortably with less pain.  There is a large amount of clear urine in the bag.  Her Tmax is 101.9 but she is now afebrile.  Her Cr is declining.   ROS:  Review of Systems  All other systems reviewed and are negative.   Anti-infectives: Anti-infectives (From admission, onward)    Start     Dose/Rate Route Frequency Ordered Stop   02/07/22 1800  cefTRIAXone (ROCEPHIN) 2 g in sodium chloride 0.9 % 100 mL IVPB        2 g 200 mL/hr over 30 Minutes Intravenous Every 24 hours 02/07/22 0002     02/06/22 1815  cefTRIAXone (ROCEPHIN) 2 g in sodium chloride 0.9 % 100 mL IVPB        2 g 200 mL/hr over 30 Minutes Intravenous  Once 02/06/22 1806 02/06/22 1913   02/06/22 1715  cefTRIAXone (ROCEPHIN) 1 g in sodium chloride 0.9 % 100 mL IVPB        1 g 200 mL/hr over 30 Minutes Intravenous  Once 02/06/22 1714 02/06/22 1757       Current Facility-Administered Medications  Medication Dose Route Frequency Provider Last Rate Last Admin   acetaminophen (TYLENOL) tablet 650 mg  650 mg Oral Q6H PRN Carollee Herter, DO       Or   acetaminophen (TYLENOL) suppository 650 mg  650 mg Rectal Q6H PRN Carollee Herter, DO       cefTRIAXone (ROCEPHIN) 2 g in sodium chloride 0.9 % 100 mL IVPB  2 g Intravenous Q24H Carollee Herter, DO       heparin injection 5,000 Units  5,000 Units Subcutaneous Q8H Carollee Herter, DO   5,000 Units at 02/07/22 0272   lactated ringers infusion   Intravenous Continuous Carollee Herter, DO 100 mL/hr at 02/07/22 0120 New Bag at 02/07/22 0120   lip balm (CARMEX) ointment            ondansetron (ZOFRAN) tablet 4 mg  4 mg Oral Q6H PRN Carollee Herter, DO       Or   ondansetron [facility]) injection 4 mg  4 mg Intravenous Q6H PRN Carollee Herter, DO       phosphorus (K PHOS NEUTRAL) tablet 500 mg  500 mg Oral TID Amin, Ankit Chirag, MD         Objective: Vital signs in last 24 hours: Temp:  [98 F (36.7 C)-101.9 F (38.8 C)] 98 F (36.7 C) (08/06 0738) Pulse Rate:   [93-129] 93 (08/06 0738) Resp:  [16-38] 16 (08/06 0738) BP: (81-152)/(54-84) 106/66 (08/06 0738) SpO2:  [64 %-100 %] 64 % (08/06 0738) Weight:  [174.6 kg] 174.6 kg (08/05 1315)  Intake/Output from previous day: 08/05 0701 - 08/06 0700 In: 1500 [P.O.:900; I.V.:600] Out: 25 [Urine:25] Intake/Output this shift: No intake/output data recorded.   Physical Exam Vitals reviewed.  Constitutional:      Appearance: Normal appearance. She is obese.     Comments: She is sleepy and was slow to arouse.      Lab Results:  Recent Labs    02/06/22 1344 02/07/22 0552  WBC 16.6* 25.3*  HGB 10.1* 9.3*  HCT 33.0* 31.4*  PLT 313 244   BMET Recent Labs    02/06/22 1344 02/07/22 0552  NA 135 138  K 3.2* 4.0  CL 108 112*  CO2 20* 19*  GLUCOSE 92 75  BUN 13 13  CREATININE 1.60* 1.41*  CALCIUM 9.9 10.0   PT/INR No results  for input(s): "LABPROT", "INR" in the last 72 hours. ABG No results for input(s): "PHART", "HCO3" in the last 72 hours.  Invalid input(s): "PCO2", "PO2"  Studies/Results: DG C-Arm 1-60 Min-No Report  Result Date: 02/06/2022 Fluoroscopy was utilized by the requesting physician.  No radiographic interpretation.   CT CHEST ABDOMEN PELVIS W CONTRAST  Result Date: 02/06/2022 CLINICAL DATA:  Sepsis. Left lower quadrant pain. Cough fever and chills. EXAM: CT CHEST, ABDOMEN, AND PELVIS WITH CONTRAST TECHNIQUE: Multidetector CT imaging of the chest, abdomen and pelvis was performed following the standard protocol during bolus administration of intravenous contrast. RADIATION DOSE REDUCTION: This exam was performed according to the departmental dose-optimization program which includes automated exposure control, adjustment of the mA and/or kV according to patient size and/or use of iterative reconstruction technique. CONTRAST:  46mL OMNIPAQUE IOHEXOL 300 MG/ML  SOLN COMPARISON:  December 12, 2011 FINDINGS: CT CHEST FINDINGS Cardiovascular: No significant vascular findings. Normal  heart size. No pericardial effusion. Mediastinum/Nodes: No enlarged mediastinal, hilar, or axillary lymph nodes. Thyroid gland, trachea, and esophagus demonstrate no significant findings. Lungs/Pleura: Lungs are clear. No pleural effusion or pneumothorax. Mild atelectasis in the lingula. Musculoskeletal: No chest wall mass or suspicious bone lesions identified. CT ABDOMEN PELVIS FINDINGS Hepatobiliary: No focal liver abnormality is seen. No gallstones, gallbladder wall thickening, or biliary dilatation. Pancreas: Unremarkable. No pancreatic ductal dilatation or surrounding inflammatory changes. Spleen: Normal in size without focal abnormality. Adrenals/Urinary Tract: Normal adrenal glands. Normal right kidney, right ureter and urinary bladder. There is an obstructive 1.1 cm proximal left ureteral calculus with moderate left hydronephrosis and proximal hydroureter. There is significant left perinephric fat stranding. Stomach/Bowel: Stomach is within normal limits. Appendix appears normal. No evidence of bowel wall thickening, distention, or inflammatory changes. Vascular/Lymphatic: No significant vascular findings are present. No enlarged abdominal or pelvic lymph nodes. Reproductive: Uterus and bilateral adnexa are unremarkable. Other: No abdominal wall hernia or abnormality. No abdominopelvic ascites. Musculoskeletal: No acute or significant osseous findings. IMPRESSION: Obstructing 1.1 cm proximal left ureteral calculus with moderate left hydronephrosis and proximal hydroureter. Significant left perinephric inflammatory fat stranding. Atelectasis in the lingula. Electronically Signed   By: Ted Mcalpine M.D.   On: 02/06/2022 17:05   DG Chest 2 View  Result Date: 02/06/2022 CLINICAL DATA:  cough EXAM: CHEST - 2 VIEW COMPARISON:  Radiograph dated January 2023 FINDINGS: The cardiomediastinal silhouette is unchanged in contour. No pleural effusion. No pneumothorax. LEFT lateral hazy platelike opacity.  Visualized abdomen is unremarkable. No acute osseous abnormality. IMPRESSION: LEFT lateral basilar hazy platelike opacity. Differential considerations include atelectasis, infection or aspiration. Electronically Signed   By: Meda Klinefelter M.D.   On: 02/06/2022 13:43     Assessment and Plan: Left proximal ureteral stone with obstruction and sepsis.  She is improving s/p stenting and will need ureteroscopy in a couple of weeks.  I will arrange.  Ok to d/c foley.  AKI.  Improving.  Good UOP.  Chronic follicular cystitis.   Once she has completed a therapeutic course of antibiotic, she would need nightly suppression for about 3 months with culture appropriate antibiotic.  Macrodantin or trimethoprim are reasonable options if she has a sensitive bacteria.       LOS: 1 day    Bjorn Pippin 02/07/2022 [phone] Patient ID: Jill Odom, female   DOB: Jul 25, 1985, 36 y.o.   MRN: [id number]

## 2022-02-07 NOTE — Progress Notes (Signed)
PHARMACY - PHYSICIAN COMMUNICATION CRITICAL VALUE ALERT - BLOOD CULTURE IDENTIFICATION (BCID)  Jill Odom is an 36 y.o. female who presented to [facility] on 02/06/2022 with a chief complaint of fever, chills, back pain.   Assessment:  severe sepsis secondary to UTI, left-sided obstructive renal stone  Name of physician (or Provider) Contacted: Bishop Limbo, NP  Current antibiotics: Ceftriaxone 2g IV q24h  Changes to prescribed antibiotics recommended:  Discontinue Ceftriaxone Start Cefepime 2g IV q8h   Results for orders placed or performed during the hospital encounter of 02/06/22  Blood Culture ID Panel (Reflexed) (Collected: 02/06/2022  1:44 PM)  Result Value Ref Range   Enterococcus faecalis NOT DETECTED NOT DETECTED   Enterococcus Faecium NOT DETECTED NOT DETECTED   Listeria monocytogenes NOT DETECTED NOT DETECTED   Staphylococcus species NOT DETECTED NOT DETECTED   Staphylococcus aureus (BCID) NOT DETECTED NOT DETECTED   Staphylococcus epidermidis NOT DETECTED NOT DETECTED   Staphylococcus lugdunensis NOT DETECTED NOT DETECTED   Streptococcus species NOT DETECTED NOT DETECTED   Streptococcus agalactiae NOT DETECTED NOT DETECTED   Streptococcus pneumoniae NOT DETECTED NOT DETECTED   Streptococcus pyogenes NOT DETECTED NOT DETECTED   A.calcoaceticus-baumannii NOT DETECTED NOT DETECTED   Bacteroides fragilis NOT DETECTED NOT DETECTED   Enterobacterales DETECTED (A) NOT DETECTED   Enterobacter cloacae complex NOT DETECTED NOT DETECTED   Escherichia coli NOT DETECTED NOT DETECTED   Klebsiella aerogenes NOT DETECTED NOT DETECTED   Klebsiella oxytoca NOT DETECTED NOT DETECTED   Klebsiella pneumoniae NOT DETECTED NOT DETECTED   Proteus species NOT DETECTED NOT DETECTED   Salmonella species NOT DETECTED NOT DETECTED   Serratia marcescens DETECTED (A) NOT DETECTED   Haemophilus influenzae NOT DETECTED NOT DETECTED   Neisseria meningitidis NOT DETECTED NOT DETECTED    Pseudomonas aeruginosa NOT DETECTED NOT DETECTED   Stenotrophomonas maltophilia NOT DETECTED NOT DETECTED   Candida albicans NOT DETECTED NOT DETECTED   Candida auris NOT DETECTED NOT DETECTED   Candida glabrata NOT DETECTED NOT DETECTED   Candida krusei NOT DETECTED NOT DETECTED   Candida parapsilosis NOT DETECTED NOT DETECTED   Candida tropicalis NOT DETECTED NOT DETECTED   Cryptococcus neoformans/gattii NOT DETECTED NOT DETECTED   CTX-M ESBL NOT DETECTED NOT DETECTED   Carbapenem resistance IMP NOT DETECTED NOT DETECTED   Carbapenem resistance KPC NOT DETECTED NOT DETECTED   Carbapenem resistance NDM NOT DETECTED NOT DETECTED   Carbapenem resist OXA 48 LIKE NOT DETECTED NOT DETECTED   Carbapenem resistance VIM NOT DETECTED NOT DETECTED    [name] 02/07/2022  8:41 PM

## 2022-02-07 NOTE — Progress Notes (Signed)
PROGRESS NOTE    Jill Odom  JAS:[id number] DOB: 05-22-1986 DOA: 02/06/2022 PCP: Verlon Au, MD   Brief Narrative:  36 year old with morbid obesity does not take any home meds comes to the hospital with 2 days of fevers chills and back pain.  Patient was found to be septic secondary to urinary tract infection and obstructive stone.  Patient was seen by urology and underwent stent placement on the left side on 8/5.   Assessment & Plan:  Principal Problem:   Left nephrolithiasis Active Problems:   Sepsis with acute organ dysfunction without septic shock (HCC)   Hydronephrosis of left kidney   AKI (acute kidney injury) (HCC)   Morbid obesity with BMI of 60.0-69.9, adult (HCC)   Hypokalemia   Hypomagnesemia   Pyelonephritis of left kidney     Assessment and Plan: Severe sepsis secondary to urinary tract infection Left-sided obstructive renal stone with hydronephrosis Chronic Follicular cystitis.  Seen by urology status post stent placement 8/5.  On empiric IV antibiotics.  Follow-up cultures. Discussed with Dr Annabell Howells. Long term will need chronic suppression. IVF, dc foley.   AKI (acute kidney injury) (HCC) Baseline creatinine 0.6, admission creatinine 1.6.  Slowly improving with IV fluids.  Morbid obesity with BMI of 60.0-69.9, adult (HCC) Weight 174.6 kg. BMI 66.09  Pyelonephritis of left kidney Due to infected stone. Continue IV Rocephin  Hypomagnesemia/hypomagnesemia/hypophosphatemia Replete as needed    DVT prophylaxis: SQ Heparin Code Status: Full Family Communication:  None  Status is: Inpatient Remains inpatient appropriate because: For IV Abx, Urology following.      Subjective: Feels better. Mouth feels dry    Examination:  General exam: Appears calm and comfortable  Respiratory system: Clear to auscultation. Respiratory effort normal. Cardiovascular system: S1 & S2 heard, RRR. No JVD, murmurs, rubs, gallops or clicks. No pedal  edema. Gastrointestinal system: Abdomen is nondistended, soft and nontender. No organomegaly or masses felt. Normal bowel sounds heard. Central nervous system: Alert and oriented. No focal neurological deficits. Extremities: Symmetric 5 x 5 power. Skin: No rashes, lesions or ulcers Psychiatry: Judgement and insight appear normal. Mood & affect appropriate.     Objective: Vitals:   02/06/22 2300 02/06/22 2315 02/07/22 0332 02/07/22 0738  BP: 104/72 104/72 103/75 106/66  Pulse:   (!) 109 93  Resp: (!) 25 (!) 22 20 16   Temp: 99.1 F (37.3 C)  98.5 F (36.9 C) 98 F (36.7 C)  TempSrc:   Oral Oral  SpO2: 97% 98% 98% (!) 64%  Weight:      Height:        Intake/Output Summary (Last 24 hours) at 02/07/2022 0812 Last data filed at 02/07/2022 0600 Gross per 24 hour  Intake 1500 ml  Output 25 ml  Net 1475 ml   Filed Weights   02/06/22 1315  Weight: (!) 174.6 kg     Data Reviewed:   CBC: Recent Labs  Lab 02/06/22 1344 02/07/22 0552  WBC 16.6* 25.3*  NEUTROABS 15.4* 24.0*  HGB 10.1* 9.3*  HCT 33.0* 31.4*  MCV 80.3 83.1  PLT 313 244   Basic Metabolic Panel: Recent Labs  Lab 02/06/22 1344 02/07/22 0552  NA 135 138  K 3.2* 4.0  CL 108 112*  CO2 20* 19*  GLUCOSE 92 75  BUN 13 13  CREATININE 1.60* 1.41*  CALCIUM 9.9 10.0  MG 1.6* 2.1  PHOS 1.2*  --    GFR: Estimated Creatinine Clearance: 90.3 mL/min (A) (by C-G formula based on SCr  of 1.41 mg/dL (H)). Liver Function Tests: Recent Labs  Lab 02/06/22 1344 02/07/22 0552  AST 32 64*  ALT 16 30  ALKPHOS 60 52  BILITOT 1.7* 2.3*  PROT 8.1 7.2  ALBUMIN 2.9* 2.6*   No results for input(s): "LIPASE", "AMYLASE" in the last 168 hours. No results for input(s): "AMMONIA" in the last 168 hours. Coagulation Profile: No results for input(s): "INR", "PROTIME" in the last 168 hours. Cardiac Enzymes: Recent Labs  Lab 02/06/22 1344  CKTOTAL 141   BNP (last 3 results) No results for input(s): "PROBNP" in the last  8760 hours. HbA1C: No results for input(s): "HGBA1C" in the last 72 hours. CBG: No results for input(s): "GLUCAP" in the last 168 hours. Lipid Profile: No results for input(s): "CHOL", "HDL", "LDLCALC", "TRIG", "CHOLHDL", "LDLDIRECT" in the last 72 hours. Thyroid Function Tests: No results for input(s): "TSH", "T4TOTAL", "FREET4", "T3FREE", "THYROIDAB" in the last 72 hours. Anemia Panel: No results for input(s): "VITAMINB12", "FOLATE", "FERRITIN", "TIBC", "IRON", "RETICCTPCT" in the last 72 hours. Sepsis Labs: Recent Labs  Lab 02/06/22 1344  PROCALCITON 3.49  LATICACIDVEN 1.9    Recent Results (from the past 240 hour(s))  SARS Coronavirus 2 by RT PCR (hospital order, performed in [facility] hospital lab) *cepheid single result test* Anterior Nasal Swab     Status: None   Collection Time: 02/06/22  1:44 PM   Specimen: Anterior Nasal Swab  Result Value Ref Range Status   SARS Coronavirus 2 by RT PCR NEGATIVE NEGATIVE Final    Comment: (NOTE) SARS-CoV-2 target nucleic acids are NOT DETECTED.  The SARS-CoV-2 RNA is generally detectable in upper and lower respiratory specimens during the acute phase of infection. The lowest concentration of SARS-CoV-2 viral copies this assay can detect is 250 copies / mL. A negative result does not preclude SARS-CoV-2 infection and should not be used as the sole basis for treatment or other patient management decisions.  A negative result may occur with improper specimen collection / handling, submission of specimen other than nasopharyngeal swab, presence of viral mutation(s) within the areas targeted by this assay, and inadequate number of viral copies (<250 copies / mL). A negative result must be combined with clinical observations, patient history, and epidemiological information.  Fact Sheet for Patients:   RoadLapTop.co.za  Fact Sheet for Healthcare Providers: http://kim-miller.com/  This  test is not yet approved or  cleared by the [place] FDA and has been authorized for detection and/or diagnosis of SARS-CoV-2 by FDA under an Emergency Use Authorization (EUA).  This EUA will remain in effect (meaning this test can be used) for the duration of the COVID-19 declaration under Section 564(b)(1) of the Act, 21 U.S.C. section 360bbb-3(b)(1), unless the authorization is terminated or revoked sooner.  Performed at [facility], [address] [place], [place] [address]          Radiology Studies: DG C-Arm 1-60 Min-No Report  Result Date: 02/06/2022 Fluoroscopy was utilized by the requesting physician.  No radiographic interpretation.   CT CHEST ABDOMEN PELVIS W CONTRAST  Result Date: 02/06/2022 CLINICAL DATA:  Sepsis. Left lower quadrant pain. Cough fever and chills. EXAM: CT CHEST, ABDOMEN, AND PELVIS WITH CONTRAST TECHNIQUE: Multidetector CT imaging of the chest, abdomen and pelvis was performed following the standard protocol during bolus administration of intravenous contrast. RADIATION DOSE REDUCTION: This exam was performed according to the departmental dose-optimization program which includes automated exposure control, adjustment of the mA and/or kV according to patient size and/or  use of iterative reconstruction technique. CONTRAST:  47mL OMNIPAQUE IOHEXOL 300 MG/ML  SOLN COMPARISON:  December 12, 2011 FINDINGS: CT CHEST FINDINGS Cardiovascular: No significant vascular findings. Normal heart size. No pericardial effusion. Mediastinum/Nodes: No enlarged mediastinal, hilar, or axillary lymph nodes. Thyroid gland, trachea, and esophagus demonstrate no significant findings. Lungs/Pleura: Lungs are clear. No pleural effusion or pneumothorax. Mild atelectasis in the lingula. Musculoskeletal: No chest wall mass or suspicious bone lesions identified. CT ABDOMEN PELVIS FINDINGS Hepatobiliary: No focal liver abnormality is seen. No gallstones, gallbladder wall thickening,  or biliary dilatation. Pancreas: Unremarkable. No pancreatic ductal dilatation or surrounding inflammatory changes. Spleen: Normal in size without focal abnormality. Adrenals/Urinary Tract: Normal adrenal glands. Normal right kidney, right ureter and urinary bladder. There is an obstructive 1.1 cm proximal left ureteral calculus with moderate left hydronephrosis and proximal hydroureter. There is significant left perinephric fat stranding. Stomach/Bowel: Stomach is within normal limits. Appendix appears normal. No evidence of bowel wall thickening, distention, or inflammatory changes. Vascular/Lymphatic: No significant vascular findings are present. No enlarged abdominal or pelvic lymph nodes. Reproductive: Uterus and bilateral adnexa are unremarkable. Other: No abdominal wall hernia or abnormality. No abdominopelvic ascites. Musculoskeletal: No acute or significant osseous findings. IMPRESSION: Obstructing 1.1 cm proximal left ureteral calculus with moderate left hydronephrosis and proximal hydroureter. Significant left perinephric inflammatory fat stranding. Atelectasis in the lingula. Electronically Signed   By: Ted Mcalpine M.D.   On: 02/06/2022 17:05   DG Chest 2 View  Result Date: 02/06/2022 CLINICAL DATA:  cough EXAM: CHEST - 2 VIEW COMPARISON:  Radiograph dated January 2023 FINDINGS: The cardiomediastinal silhouette is unchanged in contour. No pleural effusion. No pneumothorax. LEFT lateral hazy platelike opacity. Visualized abdomen is unremarkable. No acute osseous abnormality. IMPRESSION: LEFT lateral basilar hazy platelike opacity. Differential considerations include atelectasis, infection or aspiration. Electronically Signed   By: Meda Klinefelter M.D.   On: 02/06/2022 13:43        Scheduled Meds:  heparin  5,000 Units Subcutaneous Q8H   lip balm       Continuous Infusions:  cefTRIAXone (ROCEPHIN)  IV     lactated ringers 100 mL/hr at 02/07/22 0120     LOS: 1 day   Time  spent= 35 mins    Meghan Tiemann Joline Maxcy, MD [facility]  If 7PM-7AM, please contact night-coverage  02/07/2022, 8:12 AM

## 2022-02-08 ENCOUNTER — Other Ambulatory Visit: Payer: Self-pay | Admitting: Urology

## 2022-02-08 LAB — CBC
HCT: 31.8 % — ABNORMAL LOW (ref 36.0–46.0)
Hemoglobin: 9.6 g/dL — ABNORMAL LOW (ref 12.0–15.0)
MCH: 24.9 pg — ABNORMAL LOW (ref 26.0–34.0)
MCHC: 30.2 g/dL (ref 30.0–36.0)
MCV: 82.4 fL (ref 80.0–100.0)
Platelets: 233 10*3/uL (ref 150–400)
RBC: 3.86 MIL/uL — ABNORMAL LOW (ref 3.87–5.11)
RDW: 17.7 % — ABNORMAL HIGH (ref 11.5–15.5)
WBC: 21.1 10*3/uL — ABNORMAL HIGH (ref 4.0–10.5)
nRBC: 0 % (ref 0.0–0.2)

## 2022-02-08 LAB — PROCALCITONIN: Procalcitonin: 11.48 ng/mL

## 2022-02-08 LAB — BASIC METABOLIC PANEL
Anion gap: 7 (ref 5–15)
BUN: 21 mg/dL — ABNORMAL HIGH (ref 6–20)
CO2: 21 mmol/L — ABNORMAL LOW (ref 22–32)
Calcium: 10.4 mg/dL — ABNORMAL HIGH (ref 8.9–10.3)
Chloride: 108 mmol/L (ref 98–111)
Creatinine, Ser: 0.89 mg/dL (ref 0.44–1.00)
GFR, Estimated: >60 mL/min (ref >60)
Glucose, Bld: 151 mg/dL — ABNORMAL HIGH (ref 70–99)
Potassium: 3.7 mmol/L (ref 3.5–5.1)
Sodium: 136 mmol/L (ref 135–145)

## 2022-02-08 LAB — URINE CULTURE

## 2022-02-08 LAB — MAGNESIUM: Magnesium: 2 mg/dL (ref 1.7–2.4)

## 2022-02-08 MED ORDER — SODIUM CHLORIDE 0.9 % IV SOLN: INTRAVENOUS | Status: AC

## 2022-02-08 NOTE — Progress Notes (Signed)
2 Days Post-Op  Subjective: Jill Odom is resting comfortably with less pain.  She is voiding frequently with the stent.  She is afebrile.  Blood cx ID scan showed enterobacter but cultures are pending.   ROS:  Review of Systems  Musculoskeletal:  Positive for myalgias (in her legs when she gets up.).  All other systems reviewed and are negative.   Anti-infectives: Anti-infectives (From admission, onward)    Start     Dose/Rate Route Frequency Ordered Stop   02/07/22 2230  ceFEPIme (MAXIPIME) 2 g in sodium chloride 0.9 % 100 mL IVPB        2 g 200 mL/hr over 30 Minutes Intravenous Every 8 hours 02/07/22 2132     02/07/22 1800  cefTRIAXone (ROCEPHIN) 2 g in sodium chloride 0.9 % 100 mL IVPB  Status:  Discontinued        2 g 200 mL/hr over 30 Minutes Intravenous Every 24 hours 02/07/22 0002 02/07/22 2132   02/06/22 1815  cefTRIAXone (ROCEPHIN) 2 g in sodium chloride 0.9 % 100 mL IVPB        2 g 200 mL/hr over 30 Minutes Intravenous  Once 02/06/22 1806 02/06/22 1913   02/06/22 1715  cefTRIAXone (ROCEPHIN) 1 g in sodium chloride 0.9 % 100 mL IVPB        1 g 200 mL/hr over 30 Minutes Intravenous  Once 02/06/22 1714 02/06/22 1757       Current Facility-Administered Medications  Medication Dose Route Frequency Provider Last Rate Last Admin   0.9 %  sodium chloride infusion   Intravenous Continuous Amin, Ankit Chirag, MD 75 mL/hr at 02/07/22 1418 New Bag at 02/07/22 1418   acetaminophen (TYLENOL) tablet 650 mg  650 mg Oral Q6H PRN Carollee Herter, DO   650 mg at 02/07/22 1652   Or   acetaminophen (TYLENOL) suppository 650 mg  650 mg Rectal Q6H PRN Carollee Herter, DO       ceFEPIme (MAXIPIME) 2 g in sodium chloride 0.9 % 100 mL IVPB  2 g Intravenous Q8H Foust, Katy L, NP 200 mL/hr at 02/08/22 0618 2 g at 02/08/22 0618   guaiFENesin (ROBITUSSIN) 100 MG/5ML liquid 5 mL  5 mL Oral Q4H PRN Dimple Nanas, MD       heparin injection 5,000 Units  5,000 Units Subcutaneous Q8H Carollee Herter, DO   5,000  Units at 02/07/22 2157   hydrALAZINE (APRESOLINE) injection 10 mg  10 mg Intravenous Q4H PRN Amin, Ankit Chirag, MD       ipratropium-albuterol (DUONEB) 0.5-2.5 (3) MG/3ML nebulizer solution 3 mL  3 mL Nebulization Q4H PRN Amin, Ankit Chirag, MD       metoprolol tartrate (LOPRESSOR) injection 5 mg  5 mg Intravenous Q4H PRN Amin, Ankit Chirag, MD       ondansetron (ZOFRAN) tablet 4 mg  4 mg Oral Q6H PRN Carollee Herter, DO       Or   ondansetron [facility]) injection 4 mg  4 mg Intravenous Q6H PRN Carollee Herter, DO       phosphorus (K PHOS NEUTRAL) tablet 500 mg  500 mg Oral TID Amin, Ankit Chirag, MD   500 mg at 02/07/22 2158   senna-docusate (Senokot-S) tablet 1 tablet  1 tablet Oral QHS PRN Amin, Ankit Chirag, MD       traZODone (DESYREL) tablet 50 mg  50 mg Oral QHS PRN Amin, Ankit Chirag, MD         Objective: Vital signs in last 24 hours: Temp:  [  97.5 F (36.4 C)-99.2 F (37.3 C)] 98.3 F (36.8 C) (08/07 0637) Pulse Rate:  [87-97] 89 (08/07 0637) Resp:  [16-22] 20 (08/07 0637) BP: (98-118)/(49-69) 98/49 (08/07 0637) SpO2:  [64 %-100 %] 100 % (08/07 0637) Weight:  [185.4 kg] 185.4 kg (08/07 0700)  Intake/Output from previous day: 08/06 0701 - 08/07 0700 In: 532.5 [I.V.:72.5; IV Piggyback:10] Out: 900 [Urine:900] Intake/Output this shift: No intake/output data recorded.   Physical Exam Vitals reviewed.  Constitutional:      Appearance: Normal appearance. She is obese.  Neurological:     Mental Status: She is alert.     Lab Results:  Recent Labs    02/06/22 1344 02/07/22 0552  WBC 16.6* 25.3*  HGB 10.1* 9.3*  HCT 33.0* 31.4*  PLT 313 244    BMET Recent Labs    02/06/22 1344 02/07/22 0552  NA 135 138  K 3.2* 4.0  CL 108 112*  CO2 20* 19*  GLUCOSE 92 75  BUN 13 13  CREATININE 1.60* 1.41*  CALCIUM 9.9 10.0    PT/INR No results for input(s): "LABPROT", "INR" in the last 72 hours. ABG No results for input(s): "PHART", "HCO3" in the last 72 hours.  Invalid  input(s): "PCO2", "PO2"  Studies/Results: DG C-Arm 1-60 Min-No Report  Result Date: 02/06/2022 Fluoroscopy was utilized by the requesting physician.  No radiographic interpretation.   CT CHEST ABDOMEN PELVIS W CONTRAST  Result Date: 02/06/2022 CLINICAL DATA:  Sepsis. Left lower quadrant pain. Cough fever and chills. EXAM: CT CHEST, ABDOMEN, AND PELVIS WITH CONTRAST TECHNIQUE: Multidetector CT imaging of the chest, abdomen and pelvis was performed following the standard protocol during bolus administration of intravenous contrast. RADIATION DOSE REDUCTION: This exam was performed according to the departmental dose-optimization program which includes automated exposure control, adjustment of the mA and/or kV according to patient size and/or use of iterative reconstruction technique. CONTRAST:  9mL OMNIPAQUE IOHEXOL 300 MG/ML  SOLN COMPARISON:  December 12, 2011 FINDINGS: CT CHEST FINDINGS Cardiovascular: No significant vascular findings. Normal heart size. No pericardial effusion. Mediastinum/Nodes: No enlarged mediastinal, hilar, or axillary lymph nodes. Thyroid gland, trachea, and esophagus demonstrate no significant findings. Lungs/Pleura: Lungs are clear. No pleural effusion or pneumothorax. Mild atelectasis in the lingula. Musculoskeletal: No chest wall mass or suspicious bone lesions identified. CT ABDOMEN PELVIS FINDINGS Hepatobiliary: No focal liver abnormality is seen. No gallstones, gallbladder wall thickening, or biliary dilatation. Pancreas: Unremarkable. No pancreatic ductal dilatation or surrounding inflammatory changes. Spleen: Normal in size without focal abnormality. Adrenals/Urinary Tract: Normal adrenal glands. Normal right kidney, right ureter and urinary bladder. There is an obstructive 1.1 cm proximal left ureteral calculus with moderate left hydronephrosis and proximal hydroureter. There is significant left perinephric fat stranding. Stomach/Bowel: Stomach is within normal limits. Appendix  appears normal. No evidence of bowel wall thickening, distention, or inflammatory changes. Vascular/Lymphatic: No significant vascular findings are present. No enlarged abdominal or pelvic lymph nodes. Reproductive: Uterus and bilateral adnexa are unremarkable. Other: No abdominal wall hernia or abnormality. No abdominopelvic ascites. Musculoskeletal: No acute or significant osseous findings. IMPRESSION: Obstructing 1.1 cm proximal left ureteral calculus with moderate left hydronephrosis and proximal hydroureter. Significant left perinephric inflammatory fat stranding. Atelectasis in the lingula. Electronically Signed   By: Ted Mcalpine M.D.   On: 02/06/2022 17:05   DG Chest 2 View  Result Date: 02/06/2022 CLINICAL DATA:  cough EXAM: CHEST - 2 VIEW COMPARISON:  Radiograph dated January 2023 FINDINGS: The cardiomediastinal silhouette is unchanged in contour. No pleural effusion.  No pneumothorax. LEFT lateral hazy platelike opacity. Visualized abdomen is unremarkable. No acute osseous abnormality. IMPRESSION: LEFT lateral basilar hazy platelike opacity. Differential considerations include atelectasis, infection or aspiration. Electronically Signed   By: Meda Klinefelter M.D.   On: 02/06/2022 13:43     Assessment and Plan: Left proximal ureteral stone with obstruction and sepsis.  She is improving s/p stenting and will need ureteroscopy in a couple of weeks.  I will arrange.   AKI.  Improving.  Good UOP.  D/C per hospitalist service.   Chronic follicular cystitis.   Once she has completed a therapeutic course of antibiotic, she would need nightly suppression for about 3 months with culture appropriate antibiotic.  Macrodantin or trimethoprim are reasonable options if she has a sensitive bacteria.       LOS: 2 days    Bjorn Pippin 02/08/2022 [phone] Patient ID: Junius Finner, female   DOB: 1986-03-25, 36 y.o.   MRN: [id number] Patient ID: DARRAH DREDGE, female   DOB: 1986/06/02, 36 y.o.    MRN: [id number]

## 2022-02-08 NOTE — Progress Notes (Addendum)
PROGRESS NOTE    Jill Odom  HEN:[id number] DOB: [birth year] DOA: 02/06/2022 PCP: Verlon Au, MD   Brief Narrative:  36 year old with morbid obesity does not take any home meds comes to the hospital with 2 days of fevers chills and back pain.  Patient was found to be septic secondary to urinary tract infection and obstructive stone.  Patient was seen by urology and underwent stent placement on the left side on 8/5.   Assessment & Plan:  Principal Problem:   Left nephrolithiasis Active Problems:   Sepsis secondary to UTI (HCC)   Hydronephrosis of left kidney   AKI (acute kidney injury) (HCC)   Morbid obesity with BMI of 60.0-69.9, adult (HCC)   Hypokalemia   Hypomagnesemia   Pyelonephritis of left kidney     Assessment and Plan: Severe sepsis secondary to urinary tract infection Left-sided obstructive renal stone with hydronephrosis Chronic Follicular cystitis.  Seen by urology status post stent placement 8/5.  On empiric IV antibiotics.  Discussed with Dr Annabell Howells. Long term will need chronic suppression. IVF, dc foley.  Culture showing Enterobacter, sensitivities pending.  Will adjust antibiotics accordingly. Now on cefepime. WBC trending up.  AKI (acute kidney injury) (HCC) Baseline creatinine 0.6, admission creatinine 1.6.  Slowly improving.  Creatinine 1.4  Morbid obesity with BMI of 60.0-69.9, adult (HCC) Weight 174.6 kg. BMI 66.09  Hypomagnesemia/hypomagnesemia/hypophosphatemia Replete as needed    DVT prophylaxis: SQ Heparin Code Status: Full Family Communication:  None  Status is: Inpatient Remains inpatient appropriate because: WBC still trending up, pending culture data.  Maintain hospital stay for least next 24 hours.   Subjective: Seen and examined at bedside, no complaints.  Overall feels little better.  Remains afebrile. Examination: Constitutional: Not in acute distress Respiratory: Clear to auscultation bilaterally Cardiovascular: Normal  sinus rhythm, no rubs Abdomen: Nontender nondistended good bowel sounds Musculoskeletal: No edema noted Skin: No rashes seen Neurologic: CN 2-12 grossly intact.  And nonfocal Psychiatric: Normal judgment and insight. Alert and oriented x 3. Normal mood.   Objective: Vitals:   02/07/22 1330 02/07/22 2259 02/08/22 0637 02/08/22 0700  BP: 118/69 107/60 (!) 98/49   Pulse: 87 97 89   Resp: 16 (!) 22 20   Temp: 99.2 F (37.3 C) 98.1 F (36.7 C) 98.3 F (36.8 C)   TempSrc: Oral Oral Oral   SpO2: 98% 99% 100%   Weight:    (!) 185.4 kg  Height:        Intake/Output Summary (Last 24 hours) at 02/08/2022 0801 Last data filed at 02/07/2022 1700 Gross per 24 hour  Intake 532.49 ml  Output 900 ml  Net -367.51 ml   Filed Weights   02/06/22 1315 02/08/22 0700  Weight: (!) 174.6 kg (!) 185.4 kg     Data Reviewed:   CBC: Recent Labs  Lab 02/06/22 1344 02/07/22 0552  WBC 16.6* 25.3*  NEUTROABS 15.4* 24.0*  HGB 10.1* 9.3*  HCT 33.0* 31.4*  MCV 80.3 83.1  PLT 313 244   Basic Metabolic Panel: Recent Labs  Lab 02/06/22 1344 02/07/22 0552  NA 135 138  K 3.2* 4.0  CL 108 112*  CO2 20* 19*  GLUCOSE 92 75  BUN 13 13  CREATININE 1.60* 1.41*  CALCIUM 9.9 10.0  MG 1.6* 2.1  PHOS 1.2*  --    GFR: Estimated Creatinine Clearance: 94.1 mL/min (A) (by C-G formula based on SCr of 1.41 mg/dL (H)). Liver Function Tests: Recent Labs  Lab 02/06/22 1344 02/07/22 0552  AST 32 64*  ALT 16 30  ALKPHOS 60 52  BILITOT 1.7* 2.3*  PROT 8.1 7.2  ALBUMIN 2.9* 2.6*   No results for input(s): "LIPASE", "AMYLASE" in the last 168 hours. No results for input(s): "AMMONIA" in the last 168 hours. Coagulation Profile: No results for input(s): "INR", "PROTIME" in the last 168 hours. Cardiac Enzymes: Recent Labs  Lab 02/06/22 1344  CKTOTAL 141   BNP (last 3 results) No results for input(s): "PROBNP" in the last 8760 hours. HbA1C: Recent Labs    02/07/22 0552  HGBA1C 5.8*   CBG: No  results for input(s): "GLUCAP" in the last 168 hours. Lipid Profile: No results for input(s): "CHOL", "HDL", "LDLCALC", "TRIG", "CHOLHDL", "LDLDIRECT" in the last 72 hours. Thyroid Function Tests: No results for input(s): "TSH", "T4TOTAL", "FREET4", "T3FREE", "THYROIDAB" in the last 72 hours. Anemia Panel: No results for input(s): "VITAMINB12", "FOLATE", "FERRITIN", "TIBC", "IRON", "RETICCTPCT" in the last 72 hours. Sepsis Labs: Recent Labs  Lab 02/06/22 1344  PROCALCITON 3.49  LATICACIDVEN 1.9    Recent Results (from the past 240 hour(s))  SARS Coronavirus 2 by RT PCR (hospital order, performed in [facility] hospital lab) *cepheid single result test* Anterior Nasal Swab     Status: None   Collection Time: 02/06/22  1:44 PM   Specimen: Anterior Nasal Swab  Result Value Ref Range Status   SARS Coronavirus 2 by RT PCR NEGATIVE NEGATIVE Final    Comment: (NOTE) SARS-CoV-2 target nucleic acids are NOT DETECTED.  The SARS-CoV-2 RNA is generally detectable in upper and lower respiratory specimens during the acute phase of infection. The lowest concentration of SARS-CoV-2 viral copies this assay can detect is 250 copies / mL. A negative result does not preclude SARS-CoV-2 infection and should not be used as the sole basis for treatment or other patient management decisions.  A negative result may occur with improper specimen collection / handling, submission of specimen other than nasopharyngeal swab, presence of viral mutation(s) within the areas targeted by this assay, and inadequate number of viral copies (<250 copies / mL). A negative result must be combined with clinical observations, patient history, and epidemiological information.  Fact Sheet for Patients:   RoadLapTop.co.za  Fact Sheet for Healthcare Providers: http://kim-miller.com/  This test is not yet approved or  cleared by the [place] FDA and has been authorized for  detection and/or diagnosis of SARS-CoV-2 by FDA under an Emergency Use Authorization (EUA).  This EUA will remain in effect (meaning this test can be used) for the duration of the COVID-19 declaration under Section 564(b)(1) of the Act, 21 U.S.C. section 360bbb-3(b)(1), unless the authorization is terminated or revoked sooner.  Performed at [facility], [address] [place], [place] [address]   Culture, blood (routine x 2)     Status: None (Preliminary result)   Collection Time: 02/06/22  1:44 PM   Specimen: BLOOD  Result Value Ref Range Status   Specimen Description   Final    BLOOD RIGHT ANTECUBITAL Performed at [facility], [address] [place], [place] [address]    Special Requests   Final    BOTTLES DRAWN AEROBIC AND ANAEROBIC Blood Culture results may not be optimal due to an inadequate volume of blood received in culture bottles Performed at [facility], [address] [place], [place] [address]    Culture  Setup Time   Final    GRAM NEGATIVE RODS IN BOTH AEROBIC AND  ANAEROBIC BOTTLES Organism ID to follow CRITICAL RESULT CALLED TO, READ BACK BY AND VERIFIED WITH: J JADHIA,[email or web address]  02/07/22 MK Performed at [facility], [address], [place], [place] [address]    Culture PENDING  Incomplete   Report Status PENDING  Incomplete  Blood Culture ID Panel (Reflexed)     Status: Abnormal   Collection Time: 02/06/22  1:44 PM  Result Value Ref Range Status   Enterococcus faecalis NOT DETECTED NOT DETECTED Final   Enterococcus Faecium NOT DETECTED NOT DETECTED Final   Listeria monocytogenes NOT DETECTED NOT DETECTED Final   Staphylococcus species NOT DETECTED NOT DETECTED Final   Staphylococcus aureus (BCID) NOT DETECTED NOT DETECTED Final   Staphylococcus epidermidis NOT DETECTED NOT DETECTED Final   Staphylococcus lugdunensis NOT DETECTED NOT DETECTED Final   Streptococcus species NOT DETECTED NOT DETECTED Final    Streptococcus agalactiae NOT DETECTED NOT DETECTED Final   Streptococcus pneumoniae NOT DETECTED NOT DETECTED Final   Streptococcus pyogenes NOT DETECTED NOT DETECTED Final   A.calcoaceticus-baumannii NOT DETECTED NOT DETECTED Final   Bacteroides fragilis NOT DETECTED NOT DETECTED Final   Enterobacterales DETECTED (A) NOT DETECTED Final    Comment: Enterobacterales represent a large order of gram negative bacteria, not a single organism. CRITICAL RESULT CALLED TO, READ BACK BY AND VERIFIED WITH: J JADHIA,[email or web address]  02/07/22 MK    Enterobacter cloacae complex NOT DETECTED NOT DETECTED Final   Escherichia coli NOT DETECTED NOT DETECTED Final   Klebsiella aerogenes NOT DETECTED NOT DETECTED Final   Klebsiella oxytoca NOT DETECTED NOT DETECTED Final   Klebsiella pneumoniae NOT DETECTED NOT DETECTED Final   Proteus species NOT DETECTED NOT DETECTED Final   Salmonella species NOT DETECTED NOT DETECTED Final   Serratia marcescens DETECTED (A) NOT DETECTED Final    Comment: CRITICAL RESULT CALLED TO, READ BACK BY AND VERIFIED WITH: J JADHIS,[email or web address]  02/07/22 MK    Haemophilus influenzae NOT DETECTED NOT DETECTED Final   Neisseria meningitidis NOT DETECTED NOT DETECTED Final   Pseudomonas aeruginosa NOT DETECTED NOT DETECTED Final   Stenotrophomonas maltophilia NOT DETECTED NOT DETECTED Final   Candida albicans NOT DETECTED NOT DETECTED Final   Candida auris NOT DETECTED NOT DETECTED Final   Candida glabrata NOT DETECTED NOT DETECTED Final   Candida krusei NOT DETECTED NOT DETECTED Final   Candida parapsilosis NOT DETECTED NOT DETECTED Final   Candida tropicalis NOT DETECTED NOT DETECTED Final   Cryptococcus neoformans/gattii NOT DETECTED NOT DETECTED Final   CTX-M ESBL NOT DETECTED NOT DETECTED Final   Carbapenem resistance IMP NOT DETECTED NOT DETECTED Final   Carbapenem resistance KPC NOT DETECTED NOT DETECTED Final   Carbapenem resistance NDM NOT DETECTED NOT DETECTED Final    Carbapenem resist OXA 48 LIKE NOT DETECTED NOT DETECTED Final   Carbapenem resistance VIM NOT DETECTED NOT DETECTED Final    Comment: Performed at [facility], [address] [place], [place] [address]  Culture, blood (routine x 2)     Status: None (Preliminary result)   Collection Time: 02/06/22  4:20 PM   Specimen: BLOOD  Result Value Ref Range Status   Specimen Description   Final    BLOOD BLOOD LEFT ARM Performed at [facility], [address] [place], [place] [address]    Special Requests   Final    BOTTLES DRAWN AEROBIC AND ANAEROBIC Blood Culture adequate volume Performed at [facility], [address], [place], [place] [address]    Culture  Final    NO GROWTH < 12 HOURS Performed at [facility], [address] [place], [place] [address]    Report Status PENDING  Incomplete         Radiology Studies: DG C-Arm 1-60 Min-No Report  Result Date: 02/06/2022 Fluoroscopy was utilized by the requesting physician.  No radiographic interpretation.   CT CHEST ABDOMEN PELVIS W CONTRAST  Result Date: 02/06/2022 CLINICAL DATA:  Sepsis. Left lower quadrant pain. Cough fever and chills. EXAM: CT CHEST, ABDOMEN, AND PELVIS WITH CONTRAST TECHNIQUE: Multidetector CT imaging of the chest, abdomen and pelvis was performed following the standard protocol during bolus administration of intravenous contrast. RADIATION DOSE REDUCTION: This exam was performed according to the departmental dose-optimization program which includes automated exposure control, adjustment of the mA and/or kV according to patient size and/or use of iterative reconstruction technique. CONTRAST:  39mL OMNIPAQUE IOHEXOL 300 MG/ML  SOLN COMPARISON:  December 12, 2011 FINDINGS: CT CHEST FINDINGS Cardiovascular: No significant vascular findings. Normal heart size. No pericardial effusion. Mediastinum/Nodes: No enlarged mediastinal, hilar, or axillary lymph nodes. Thyroid gland, trachea, and  esophagus demonstrate no significant findings. Lungs/Pleura: Lungs are clear. No pleural effusion or pneumothorax. Mild atelectasis in the lingula. Musculoskeletal: No chest wall mass or suspicious bone lesions identified. CT ABDOMEN PELVIS FINDINGS Hepatobiliary: No focal liver abnormality is seen. No gallstones, gallbladder wall thickening, or biliary dilatation. Pancreas: Unremarkable. No pancreatic ductal dilatation or surrounding inflammatory changes. Spleen: Normal in size without focal abnormality. Adrenals/Urinary Tract: Normal adrenal glands. Normal right kidney, right ureter and urinary bladder. There is an obstructive 1.1 cm proximal left ureteral calculus with moderate left hydronephrosis and proximal hydroureter. There is significant left perinephric fat stranding. Stomach/Bowel: Stomach is within normal limits. Appendix appears normal. No evidence of bowel wall thickening, distention, or inflammatory changes. Vascular/Lymphatic: No significant vascular findings are present. No enlarged abdominal or pelvic lymph nodes. Reproductive: Uterus and bilateral adnexa are unremarkable. Other: No abdominal wall hernia or abnormality. No abdominopelvic ascites. Musculoskeletal: No acute or significant osseous findings. IMPRESSION: Obstructing 1.1 cm proximal left ureteral calculus with moderate left hydronephrosis and proximal hydroureter. Significant left perinephric inflammatory fat stranding. Atelectasis in the lingula. Electronically Signed   By: Ted Mcalpine M.D.   On: 02/06/2022 17:05   DG Chest 2 View  Result Date: 02/06/2022 CLINICAL DATA:  cough EXAM: CHEST - 2 VIEW COMPARISON:  Radiograph dated January 2023 FINDINGS: The cardiomediastinal silhouette is unchanged in contour. No pleural effusion. No pneumothorax. LEFT lateral hazy platelike opacity. Visualized abdomen is unremarkable. No acute osseous abnormality. IMPRESSION: LEFT lateral basilar hazy platelike opacity. Differential  considerations include atelectasis, infection or aspiration. Electronically Signed   By: Meda Klinefelter M.D.   On: 02/06/2022 13:43        Scheduled Meds:  heparin  5,000 Units Subcutaneous Q8H   phosphorus  500 mg Oral TID   Continuous Infusions:  sodium chloride 75 mL/hr at 02/07/22 1418   ceFEPime (MAXIPIME) IV 2 g (02/08/22 0618)     LOS: 2 days   Time spent= 35 mins    Daviona Herbert Joline Maxcy, MD [facility]  If 7PM-7AM, please contact night-coverage  02/08/2022, 8:01 AM

## 2022-02-09 LAB — CULTURE, BLOOD (ROUTINE X 2)

## 2022-02-09 LAB — URINE CULTURE: Culture: >=100000 — AB

## 2022-02-09 LAB — PHOSPHORUS: Phosphorus: 1.9 mg/dL — ABNORMAL LOW (ref 2.5–4.6)

## 2022-02-09 LAB — BASIC METABOLIC PANEL
Anion gap: 6 (ref 5–15)
BUN: 13 mg/dL (ref 6–20)
CO2: 24 mmol/L (ref 22–32)
Calcium: 10.1 mg/dL (ref 8.9–10.3)
Chloride: 109 mmol/L (ref 98–111)
Creatinine, Ser: 0.95 mg/dL (ref 0.44–1.00)
GFR, Estimated: >60 mL/min (ref >60)
Glucose, Bld: 106 mg/dL — ABNORMAL HIGH (ref 70–99)
Potassium: 3.4 mmol/L — ABNORMAL LOW (ref 3.5–5.1)
Sodium: 139 mmol/L (ref 135–145)

## 2022-02-09 LAB — CBC
HCT: 31.7 % — ABNORMAL LOW (ref 36.0–46.0)
Hemoglobin: 9.4 g/dL — ABNORMAL LOW (ref 12.0–15.0)
MCH: 24 pg — ABNORMAL LOW (ref 26.0–34.0)
MCHC: 29.7 g/dL — ABNORMAL LOW (ref 30.0–36.0)
MCV: 81.1 fL (ref 80.0–100.0)
Platelets: 230 10*3/uL (ref 150–400)
RBC: 3.91 MIL/uL (ref 3.87–5.11)
RDW: 17.8 % — ABNORMAL HIGH (ref 11.5–15.5)
WBC: 14.4 10*3/uL — ABNORMAL HIGH (ref 4.0–10.5)
nRBC: 0 % (ref 0.0–0.2)

## 2022-02-09 LAB — MAGNESIUM: Magnesium: 1.9 mg/dL (ref 1.7–2.4)

## 2022-02-09 MED ORDER — SULFAMETHOXAZOLE-TRIMETHOPRIM 800-160 MG PO TABS: 2.0000 | ORAL_TABLET | Freq: Two times a day (BID) | ORAL | 0 refills | Status: AC

## 2022-02-09 MED ORDER — K PHOS MONO-SOD PHOS DI & MONO 155-852-130 MG PO TABS: 500.0000 mg | ORAL_TABLET | Freq: Three times a day (TID) | ORAL | 0 refills | Status: DC

## 2022-02-09 MED ORDER — POTASSIUM CHLORIDE 20 MEQ PO PACK
40.0000 meq | PACK | Freq: Once | ORAL | Status: AC
  Administered 2022-02-09: 40 meq via ORAL
  Filled 2022-02-09: qty 2

## 2022-02-09 MED ORDER — SULFAMETHOXAZOLE-TRIMETHOPRIM 800-160 MG PO TABS: 1.0000 | ORAL_TABLET | Freq: Two times a day (BID) | ORAL | 0 refills | Status: DC

## 2022-02-09 MED ORDER — TRIMETHOPRIM 100 MG PO TABS: 100.0000 mg | ORAL_TABLET | Freq: Two times a day (BID) | ORAL | 2 refills | Status: AC

## 2022-02-09 NOTE — Plan of Care (Signed)

## 2022-02-09 NOTE — Progress Notes (Signed)
3 Days Post-Op  Subjective: Jill Odom is resting comfortably with less pain.  She is voiding frequently with the stent.  She is afebrile.  Blood and urine cultures are growing Serratia with sens pending. .   ROS:  Review of Systems  Musculoskeletal:  Positive for myalgias (in her legs when she gets up.).  All other systems reviewed and are negative.   Anti-infectives: Anti-infectives (From admission, onward)    Start     Dose/Rate Route Frequency Ordered Stop   02/07/22 2230  ceFEPIme (MAXIPIME) 2 g in sodium chloride 0.9 % 100 mL IVPB        2 g 200 mL/hr over 30 Minutes Intravenous Every 8 hours 02/07/22 2132     02/07/22 1800  cefTRIAXone (ROCEPHIN) 2 g in sodium chloride 0.9 % 100 mL IVPB  Status:  Discontinued        2 g 200 mL/hr over 30 Minutes Intravenous Every 24 hours 02/07/22 0002 02/07/22 2132   02/06/22 1815  cefTRIAXone (ROCEPHIN) 2 g in sodium chloride 0.9 % 100 mL IVPB        2 g 200 mL/hr over 30 Minutes Intravenous  Once 02/06/22 1806 02/06/22 1913   02/06/22 1715  cefTRIAXone (ROCEPHIN) 1 g in sodium chloride 0.9 % 100 mL IVPB        1 g 200 mL/hr over 30 Minutes Intravenous  Once 02/06/22 1714 02/06/22 1757       Current Facility-Administered Medications  Medication Dose Route Frequency Provider Last Rate Last Admin   0.9 %  sodium chloride infusion   Intravenous Continuous Amin, Ankit Chirag, MD 100 mL/hr at 02/08/22 0949 New Bag at 02/08/22 0949   acetaminophen (TYLENOL) tablet 650 mg  650 mg Oral Q6H PRN Carollee Herter, DO   650 mg at 02/08/22 1540   Or   acetaminophen (TYLENOL) suppository 650 mg  650 mg Rectal Q6H PRN Carollee Herter, DO       ceFEPIme (MAXIPIME) 2 g in sodium chloride 0.9 % 100 mL IVPB  2 g Intravenous Q8H Foust, Katy L, NP 200 mL/hr at 02/09/22 0538 2 g at 02/09/22 0538   guaiFENesin (ROBITUSSIN) 100 MG/5ML liquid 5 mL  5 mL Oral Q4H PRN Dimple Nanas, MD       heparin injection 5,000 Units  5,000 Units Subcutaneous Q8H Carollee Herter, DO    5,000 Units at 02/09/22 5643   hydrALAZINE (APRESOLINE) injection 10 mg  10 mg Intravenous Q4H PRN Amin, Ankit Chirag, MD       ipratropium-albuterol (DUONEB) 0.5-2.5 (3) MG/3ML nebulizer solution 3 mL  3 mL Nebulization Q4H PRN Amin, Ankit Chirag, MD       metoprolol tartrate (LOPRESSOR) injection 5 mg  5 mg Intravenous Q4H PRN Amin, Ankit Chirag, MD       ondansetron (ZOFRAN) tablet 4 mg  4 mg Oral Q6H PRN Carollee Herter, DO       Or   ondansetron [facility]) injection 4 mg  4 mg Intravenous Q6H PRN Carollee Herter, DO       phosphorus (K PHOS NEUTRAL) tablet 500 mg  500 mg Oral TID Amin, Ankit Chirag, MD   500 mg at 02/08/22 2209   senna-docusate (Senokot-S) tablet 1 tablet  1 tablet Oral QHS PRN Amin, Ankit Chirag, MD       traZODone (DESYREL) tablet 50 mg  50 mg Oral QHS PRN Amin, Ankit Chirag, MD         Objective: Vital signs in last 24 hours:  Temp:  [97.9 F (36.6 C)-98.4 F (36.9 C)] 97.9 F (36.6 C) (08/07 2337) Pulse Rate:  [91-94] 91 (08/07 2337) Resp:  [18-22] 22 (08/07 2337) BP: (111-113)/(58-68) 113/68 (08/07 2337) SpO2:  [97 %] 97 % (08/07 2337) Weight:  [185.4 kg] 185.4 kg (08/07 0700)  Intake/Output from previous day: 08/07 0701 - 08/08 0700 In: 559.4 [I.V.:359.4; IV Piggyback:200] Out: -  Intake/Output this shift: No intake/output data recorded.   Physical Exam Vitals reviewed.  Constitutional:      Appearance: Normal appearance. She is obese.  Neurological:     Mental Status: She is alert.     Lab Results:  Recent Labs    02/08/22 0847 02/09/22 0526  WBC 21.1* 14.4*  HGB 9.6* 9.4*  HCT 31.8* 31.7*  PLT 233 230    BMET Recent Labs    02/08/22 0847 02/09/22 0526  NA 136 139  K 3.7 3.4*  CL 108 109  CO2 21* 24  GLUCOSE 151* 106*  BUN 21* 13  CREATININE 0.89 0.95  CALCIUM 10.4* 10.1    PT/INR No results for input(s): "LABPROT", "INR" in the last 72 hours. ABG No results for input(s): "PHART", "HCO3" in the last 72 hours.  Invalid input(s):  "PCO2", "PO2"  Studies/Results: No results found.   Assessment and Plan: Left proximal ureteral stone with obstruction and sepsis.  She is improving s/p stenting and will need ureteroscopy in a couple of weeks.  I will arrange.    AKI.  Resolved.   Chronic follicular cystitis.   Once she has completed a therapeutic course of antibiotic, she would need nightly suppression for about 3 months with culture appropriate antibiotic.  Macrodantin or trimethoprim are reasonable options if she has a sensitive bacteria.       LOS: 3 days    Bjorn Pippin 02/09/2022 [phone] Patient ID: Jill Odom, female   DOB: Jan 18, 1986, 36 y.o.   MRN: [id number] Patient ID: Jill Odom, female   DOB: 1985/07/08, 36 y.o.   MRN: [id number] Patient ID: Jill Odom, female   DOB: 27-Jun-1986, 36 y.o.   MRN: [id number]

## 2022-02-09 NOTE — Discharge Summary (Signed)
Physician Discharge Summary  DESHANTI ADCOX AVW:[id number] DOB: Aug 01, 1985 DOA: 02/06/2022  PCP: Verlon Au, MD  Admit date: 02/06/2022 Discharge date: 02/09/2022  Admitted From: Home Disposition:  Home  Recommendations for Outpatient Follow-up:  Follow up with PCP in 1-2 weeks Please obtain BMP/CBC in one week your next doctors visit.  Bactrim taken twice a day for 8 more days thereafter she can start trimethoprim 100 mg twice daily for 3 more months as advised by urology Outpatient arrangements to be made by their service Advised to take Neutra-Phos for 3 more doses as outpatient.  Home Health: None Equipment/Devices: None Discharge Condition: Stable CODE STATUS: Full code Diet recommendation: Regular  Brief/Interim Summary: 36 year old with morbid obesity does not take any home meds comes to the hospital with 2 days of fevers chills and back pain.  Patient was found to be septic secondary to urinary tract infection and obstructive stone.  Patient was seen by urology and underwent stent placement on the left side on 8/5.  Eventually all cultures grew Serratia marcescens which was sensitive to Bactrim.  Patient was prescribed 8 more days of Bactrim upon discharge followed by chronic suppressive therapy with trimethoprim as advised by urology.  Recommend outpatient follow-up with their service.  Rest the recommendations as mentioned above.     Assessment & Plan:  Principal Problem:   Left nephrolithiasis Active Problems:   Sepsis secondary to UTI (HCC)   Hydronephrosis of left kidney   AKI (acute kidney injury) (HCC)   Morbid obesity with BMI of 60.0-69.9, adult (HCC)   Hypokalemia   Hypomagnesemia   Pyelonephritis of left kidney       Assessment and Plan: Severe sepsis secondary to urinary tract infection Left-sided obstructive renal stone with hydronephrosis Chronic Follicular cystitis.  Seen by urology status post stent placement 8/5.  On empiric IV antibiotics.   Discussed with Dr Annabell Howells. Long term will need chronic suppression.  Cultures reviewed, will discharge on 8 more days of Bactrim followed by chronic suppressive therapy.  Outpatient follow-up with urology.  AKI (acute kidney injury) (HCC), resolved Baseline creatinine 0.6, admission creatinine 1.6.  Slowly improving.  Creatinine 1.4   Morbid obesity with BMI of 60.0-69.9, adult (HCC) Weight 174.6 kg. BMI 66.09   Hypomagnesemia/hypomagnesemia/hypophosphatemia Replete as needed         Discharge Diagnoses:  Principal Problem:   Left nephrolithiasis Active Problems:   Sepsis secondary to UTI (HCC)   Hydronephrosis of left kidney   AKI (acute kidney injury) (HCC)   Morbid obesity with BMI of 60.0-69.9, adult (HCC)   Hypokalemia   Hypomagnesemia   Pyelonephritis of left kidney      Consultations: [facility]  Subjective: Feels great no complaints.  Wishes to go home.  Discharge Exam: Vitals:   02/08/22 2337 02/09/22 0659  BP: 113/68 117/64  Pulse: 91 (!) 105  Resp: (!) 22 20  Temp: 97.9 F (36.6 C) (!) 100.4 F (38 C)  SpO2: 97% 100%   Vitals:   02/08/22 1456 02/08/22 2337 02/09/22 0659 02/09/22 0700  BP: (!) 111/58 113/68 117/64   Pulse: 94 91 (!) 105   Resp: 18 (!) 22 20   Temp: 98.4 F (36.9 C) 97.9 F (36.6 C) (!) 100.4 F (38 C)   TempSrc: Oral Oral Oral   SpO2: 97% 97% 100%   Weight:    (!) 188.3 kg  Height:        General: Pt is alert, awake, not in acute distress Cardiovascular: RRR,  S1/S2 +, no rubs, no gallops Respiratory: CTA bilaterally, no wheezing, no rhonchi Abdominal: Soft, NT, ND, bowel sounds + Extremities: no edema, no cyanosis  Discharge Instructions   Allergies as of 02/09/2022       Reactions   Caffeine Other (See Comments)   Itching and throat swelling.        Medication List     TAKE these medications    acetaminophen 500 MG tablet Commonly known as: TYLENOL Take 1,000 mg by mouth every 6 (six) hours as needed  for mild pain.   albuterol 108 (90 Base) MCG/ACT inhaler Commonly known as: VENTOLIN HFA Inhale 1-2 puffs into the lungs every 6 (six) hours as needed for wheezing or shortness of breath.   Humira Pen 40 MG/0.8ML Pnkt Generic drug: Adalimumab SMARTSIG:1 Pre-Filled Pen Syringe SUB-Q Once a Week   ibuprofen 200 MG tablet Commonly known as: ADVIL Take 400 mg by mouth every 6 (six) hours as needed for mild pain or headache.   phosphorus 155-852-130 MG tablet Commonly known as: K PHOS NEUTRAL Take 2 tablets (500 mg total) by mouth 3 (three) times daily.   sulfamethoxazole-trimethoprim 800-160 MG tablet Commonly known as: BACTRIM DS Take 2 tablets by mouth 2 (two) times daily for 8 days.   trimethoprim 100 MG tablet Commonly known as: TRIMPEX Take 1 tablet (100 mg total) by mouth 2 (two) times daily. Start taking on: February 16, 2022        Follow-up Information     Bjorn Pippin, MD Follow up.   Specialty: Urology Why: My office will call to arrange your next procedure for sometime in the next 7-14 days. Contact information: [address] [place] [address] [phone]         Verlon Au, MD Follow up in 1 week(s).   Specialty: Family Medicine Contact information: [address] [place] [address] [phone]                Allergies  Allergen Reactions   Caffeine Other (See Comments)    Itching and throat swelling.    You were cared for by a hospitalist during your hospital stay. If you have any questions about your discharge medications or the care you received while you were in the hospital after you are discharged, you can call the unit and asked to speak with the hospitalist on call if the hospitalist that took care of you is not available. Once you are discharged, your primary care physician will handle any further medical issues. Please note that no refills for any discharge medications will be authorized once you are  discharged, as it is imperative that you return to your primary care physician (or establish a relationship with a primary care physician if you do not have one) for your aftercare needs so that they can reassess your need for medications and monitor your lab values.   Procedures/Studies: DG C-Arm 1-60 Min-No Report  Result Date: 02/06/2022 Fluoroscopy was utilized by the requesting physician.  No radiographic interpretation.   CT CHEST ABDOMEN PELVIS W CONTRAST  Result Date: 02/06/2022 CLINICAL DATA:  Sepsis. Left lower quadrant pain. Cough fever and chills. EXAM: CT CHEST, ABDOMEN, AND PELVIS WITH CONTRAST TECHNIQUE: Multidetector CT imaging of the chest, abdomen and pelvis was performed following the standard protocol during bolus administration of intravenous contrast. RADIATION DOSE REDUCTION: This exam was performed according to the departmental dose-optimization program which includes automated exposure control, adjustment of the mA and/or  kV according to patient size and/or use of iterative reconstruction technique. CONTRAST:  4mL OMNIPAQUE IOHEXOL 300 MG/ML  SOLN COMPARISON:  December 12, 2011 FINDINGS: CT CHEST FINDINGS Cardiovascular: No significant vascular findings. Normal heart size. No pericardial effusion. Mediastinum/Nodes: No enlarged mediastinal, hilar, or axillary lymph nodes. Thyroid gland, trachea, and esophagus demonstrate no significant findings. Lungs/Pleura: Lungs are clear. No pleural effusion or pneumothorax. Mild atelectasis in the lingula. Musculoskeletal: No chest wall mass or suspicious bone lesions identified. CT ABDOMEN PELVIS FINDINGS Hepatobiliary: No focal liver abnormality is seen. No gallstones, gallbladder wall thickening, or biliary dilatation. Pancreas: Unremarkable. No pancreatic ductal dilatation or surrounding inflammatory changes. Spleen: Normal in size without focal abnormality. Adrenals/Urinary Tract: Normal adrenal glands. Normal right kidney, right ureter and  urinary bladder. There is an obstructive 1.1 cm proximal left ureteral calculus with moderate left hydronephrosis and proximal hydroureter. There is significant left perinephric fat stranding. Stomach/Bowel: Stomach is within normal limits. Appendix appears normal. No evidence of bowel wall thickening, distention, or inflammatory changes. Vascular/Lymphatic: No significant vascular findings are present. No enlarged abdominal or pelvic lymph nodes. Reproductive: Uterus and bilateral adnexa are unremarkable. Other: No abdominal wall hernia or abnormality. No abdominopelvic ascites. Musculoskeletal: No acute or significant osseous findings. IMPRESSION: Obstructing 1.1 cm proximal left ureteral calculus with moderate left hydronephrosis and proximal hydroureter. Significant left perinephric inflammatory fat stranding. Atelectasis in the lingula. Electronically Signed   By: Ted Mcalpine M.D.   On: 02/06/2022 17:05   DG Chest 2 View  Result Date: 02/06/2022 CLINICAL DATA:  cough EXAM: CHEST - 2 VIEW COMPARISON:  Radiograph dated January 2023 FINDINGS: The cardiomediastinal silhouette is unchanged in contour. No pleural effusion. No pneumothorax. LEFT lateral hazy platelike opacity. Visualized abdomen is unremarkable. No acute osseous abnormality. IMPRESSION: LEFT lateral basilar hazy platelike opacity. Differential considerations include atelectasis, infection or aspiration. Electronically Signed   By: Meda Klinefelter M.D.   On: 02/06/2022 13:43     The results of significant diagnostics from this hospitalization (including imaging, microbiology, ancillary and laboratory) are listed below for reference.     Microbiology: Recent Results (from the past 240 hour(s))  SARS Coronavirus 2 by RT PCR (hospital order, performed in [facility] hospital lab) *cepheid single result test* Anterior Nasal Swab     Status: None   Collection Time: 02/06/22  1:44 PM   Specimen: Anterior Nasal Swab  Result Value Ref  Range Status   SARS Coronavirus 2 by RT PCR NEGATIVE NEGATIVE Final    Comment: (NOTE) SARS-CoV-2 target nucleic acids are NOT DETECTED.  The SARS-CoV-2 RNA is generally detectable in upper and lower respiratory specimens during the acute phase of infection. The lowest concentration of SARS-CoV-2 viral copies this assay can detect is 250 copies / mL. A negative result does not preclude SARS-CoV-2 infection and should not be used as the sole basis for treatment or other patient management decisions.  A negative result may occur with improper specimen collection / handling, submission of specimen other than nasopharyngeal swab, presence of viral mutation(s) within the areas targeted by this assay, and inadequate number of viral copies (<250 copies / mL). A negative result must be combined with clinical observations, patient history, and epidemiological information.  Fact Sheet for Patients:   RoadLapTop.co.za  Fact Sheet for Healthcare Providers: http://kim-miller.com/  This test is not yet approved or  cleared by the [place] FDA and has been authorized for detection and/or diagnosis of SARS-CoV-2 by FDA under an Emergency Use Authorization (EUA).  This EUA will remain in effect (meaning this test can be used) for the duration of the COVID-19 declaration under Section 564(b)(1) of the Act, 21 U.S.C. section 360bbb-3(b)(1), unless the authorization is terminated or revoked sooner.  Performed at [facility], [address] [place], [place] [address]   Culture, blood (routine x 2)     Status: Abnormal   Collection Time: 02/06/22  1:44 PM   Specimen: BLOOD  Result Value Ref Range Status   Specimen Description   Final    BLOOD RIGHT ANTECUBITAL Performed at [facility], [address] [place], [place] [address]    Special Requests   Final    BOTTLES DRAWN AEROBIC AND ANAEROBIC Blood Culture results may not  be optimal due to an inadequate volume of blood received in culture bottles Performed at [facility], [address] [place], [place] [address]    Culture  Setup Time   Final    GRAM NEGATIVE RODS IN BOTH AEROBIC AND ANAEROBIC BOTTLES CRITICAL RESULT CALLED TO, READ BACK BY AND VERIFIED WITH: J JADHIA,PHARMD@[email or web address]  02/07/22 MK Performed at [facility], [address] [place], [place] [address]    Culture SERRATIA MARCESCENS (A)  Final   Report Status 02/09/2022 FINAL  Final   Organism ID, Bacteria SERRATIA MARCESCENS  Final      Susceptibility   Serratia marcescens - MIC*    CEFAZOLIN >=64 RESISTANT Resistant     CEFEPIME 0.25 SENSITIVE Sensitive     CEFTAZIDIME <=1 SENSITIVE Sensitive     CEFTRIAXONE <=0.25 SENSITIVE Sensitive     CIPROFLOXACIN <=0.25 SENSITIVE Sensitive     GENTAMICIN <=1 SENSITIVE Sensitive     TRIMETH/SULFA <=20 SENSITIVE Sensitive     * SERRATIA MARCESCENS  Blood Culture ID Panel (Reflexed)     Status: Abnormal   Collection Time: 02/06/22  1:44 PM  Result Value Ref Range Status   Enterococcus faecalis NOT DETECTED NOT DETECTED Final   Enterococcus Faecium NOT DETECTED NOT DETECTED Final   Listeria monocytogenes NOT DETECTED NOT DETECTED Final   Staphylococcus species NOT DETECTED NOT DETECTED Final   Staphylococcus aureus (BCID) NOT DETECTED NOT DETECTED Final   Staphylococcus epidermidis NOT DETECTED NOT DETECTED Final   Staphylococcus lugdunensis NOT DETECTED NOT DETECTED Final   Streptococcus species NOT DETECTED NOT DETECTED Final   Streptococcus agalactiae NOT DETECTED NOT DETECTED Final   Streptococcus pneumoniae NOT DETECTED NOT DETECTED Final   Streptococcus pyogenes NOT DETECTED NOT DETECTED Final   A.calcoaceticus-baumannii NOT DETECTED NOT DETECTED Final   Bacteroides fragilis NOT DETECTED NOT DETECTED Final   Enterobacterales DETECTED (A) NOT DETECTED Final    Comment: Enterobacterales represent a large order of gram negative  bacteria, not a single organism. CRITICAL RESULT CALLED TO, READ BACK BY AND VERIFIED WITH: J JADHIA,PHARMD@[email or web address]  02/07/22 MK    Enterobacter cloacae complex NOT DETECTED NOT DETECTED Final   Escherichia coli NOT DETECTED NOT DETECTED Final   Klebsiella aerogenes NOT DETECTED NOT DETECTED Final   Klebsiella oxytoca NOT DETECTED NOT DETECTED Final   Klebsiella pneumoniae NOT DETECTED NOT DETECTED Final   Proteus species NOT DETECTED NOT DETECTED Final   Salmonella species NOT DETECTED NOT DETECTED Final   Serratia marcescens DETECTED (A) NOT DETECTED Final    Comment: CRITICAL RESULT CALLED TO, READ BACK BY AND VERIFIED WITH: J JADHIS,PHARMD@2035  02/07/22 MK    Haemophilus influenzae NOT DETECTED NOT DETECTED Final   Neisseria meningitidis NOT DETECTED NOT  DETECTED Final   Pseudomonas aeruginosa NOT DETECTED NOT DETECTED Final   Stenotrophomonas maltophilia NOT DETECTED NOT DETECTED Final   Candida albicans NOT DETECTED NOT DETECTED Final   Candida auris NOT DETECTED NOT DETECTED Final   Candida glabrata NOT DETECTED NOT DETECTED Final   Candida krusei NOT DETECTED NOT DETECTED Final   Candida parapsilosis NOT DETECTED NOT DETECTED Final   Candida tropicalis NOT DETECTED NOT DETECTED Final   Cryptococcus neoformans/gattii NOT DETECTED NOT DETECTED Final   CTX-M ESBL NOT DETECTED NOT DETECTED Final   Carbapenem resistance IMP NOT DETECTED NOT DETECTED Final   Carbapenem resistance KPC NOT DETECTED NOT DETECTED Final   Carbapenem resistance NDM NOT DETECTED NOT DETECTED Final   Carbapenem resist OXA 48 LIKE NOT DETECTED NOT DETECTED Final   Carbapenem resistance VIM NOT DETECTED NOT DETECTED Final    Comment: Performed at [facility], [address] [place], [place] [address]  Urine Culture     Status: Abnormal   Collection Time: 02/06/22  3:44 PM   Specimen: Urine, Clean Catch  Result Value Ref Range Status   Specimen Description   Final    URINE, CLEAN CATCH Performed at  [facility], [address] [place], [place] [address]    Special Requests   Final    NONE Performed at [facility], [address] [place], [place] [address]    Culture MULTIPLE SPECIES PRESENT, SUGGEST RECOLLECTION (A)  Final   Report Status 02/08/2022 FINAL  Final  Culture, blood (routine x 2)     Status: None (Preliminary result)   Collection Time: 02/06/22  4:20 PM   Specimen: BLOOD  Result Value Ref Range Status   Specimen Description   Final    BLOOD BLOOD LEFT ARM Performed at [facility], [address] [place], [place] [address]    Special Requests   Final    BOTTLES DRAWN AEROBIC AND ANAEROBIC Blood Culture adequate volume Performed at [facility], [address] [place], [place] [address]    Culture   Final    NO GROWTH 3 DAYS Performed at [facility], [address], [place], [place] [address]    Report Status PENDING  Incomplete  Urine Culture     Status: Abnormal   Collection Time: 02/06/22 10:33 PM   Specimen: Urine, Cystoscope  Result Value Ref Range Status   Specimen Description   Final    URINE, CLEAN CATCH LEFT URETERAL STONE, SEPSIS CYSTOSCOPY Performed at [facility], [address], [place], [place] [address]    Special Requests   Final    NONE Performed at [facility], [address] [place], [place] [address]    Culture >=100,000 COLONIES/mL SERRATIA MARCESCENS (A)  Final   Report Status 02/09/2022 FINAL  Final   Organism ID, Bacteria SERRATIA MARCESCENS (A)  Final      Susceptibility   Serratia marcescens - MIC*    CEFAZOLIN >=64 RESISTANT Resistant     CEFEPIME <=0.12 SENSITIVE Sensitive     CEFTRIAXONE <=0.25 SENSITIVE Sensitive     CIPROFLOXACIN <=0.25 SENSITIVE Sensitive     GENTAMICIN <=1 SENSITIVE Sensitive     NITROFURANTOIN 128 RESISTANT Resistant     TRIMETH/SULFA <=20 SENSITIVE Sensitive     * >=100,000 COLONIES/mL SERRATIA MARCESCENS      Labs: BNP (last 3 results) No results for input(s): "BNP" in the last 8760  hours. Basic Metabolic Panel: Recent Labs  Lab 02/06/22 1344 02/07/22 0552 02/08/22 0847 02/09/22 0526 02/09/22 0532  NA 135 138 136 139  --   K 3.2* 4.0 3.7 3.4*  --   CL 108 112* 108 109  --   CO2 20* 19* 21* 24  --   GLUCOSE 92 75 151* 106*  --   BUN 13 13 21* 13  --   CREATININE 1.60* 1.41* 0.89 0.95  --   CALCIUM 9.9 10.0 10.4* 10.1  --   MG 1.6* 2.1 2.0 1.9  --   PHOS 1.2*  --   --   --  1.9*   Liver Function Tests: Recent Labs  Lab 02/06/22 1344 02/07/22 0552  AST 32 64*  ALT 16 30  ALKPHOS 60 52  BILITOT 1.7* 2.3*  PROT 8.1 7.2  ALBUMIN 2.9* 2.6*   No results for input(s): "LIPASE", "AMYLASE" in the last 168 hours. No results for input(s): "AMMONIA" in the last 168 hours. CBC: Recent Labs  Lab 02/06/22 1344 02/07/22 0552 02/08/22 0847 02/09/22 0526  WBC 16.6* 25.3* 21.1* 14.4*  NEUTROABS 15.4* 24.0*  --   --   HGB 10.1* 9.3* 9.6* 9.4*  HCT 33.0* 31.4* 31.8* 31.7*  MCV 80.3 83.1 82.4 81.1  PLT 313 244 233 230   Cardiac Enzymes: Recent Labs  Lab 02/06/22 1344  CKTOTAL 141   BNP: Invalid input(s): "POCBNP" CBG: No results for input(s): "GLUCAP" in the last 168 hours. D-Dimer No results for input(s): "DDIMER" in the last 72 hours. Hgb A1c Recent Labs    02/07/22 0552  HGBA1C 5.8*   Lipid Profile No results for input(s): "CHOL", "HDL", "LDLCALC", "TRIG", "CHOLHDL", "LDLDIRECT" in the last 72 hours. Thyroid function studies No results for input(s): "TSH", "T4TOTAL", "T3FREE", "THYROIDAB" in the last 72 hours.  Invalid input(s): "FREET3" Anemia work up No results for input(s): "VITAMINB12", "FOLATE", "FERRITIN", "TIBC", "IRON", "RETICCTPCT" in the last 72 hours. Urinalysis    Component Value Date/Time   COLORURINE YELLOW 02/06/2022 1544   APPEARANCEUR CLEAR 02/06/2022 1544   LABSPEC <=1.005 02/06/2022 1544   PHURINE 5.5 02/06/2022 1544   GLUCOSEU  NEGATIVE 02/06/2022 1544   HGBUR MODERATE (A) 02/06/2022 1544   BILIRUBINUR NEGATIVE 02/06/2022 1544   BILIRUBINUR neg 05/03/2017 1446   KETONESUR NEGATIVE 02/06/2022 1544   PROTEINUR NEGATIVE 02/06/2022 1544   UROBILINOGEN 0.2 06/21/2018 0841   NITRITE NEGATIVE 02/06/2022 1544   LEUKOCYTESUR TRACE (A) 02/06/2022 1544   Sepsis Labs Recent Labs  Lab 02/06/22 1344 02/07/22 0552 02/08/22 0847 02/09/22 0526  WBC 16.6* 25.3* 21.1* 14.4*   Microbiology Recent Results (from the past 240 hour(s))  SARS Coronavirus 2 by RT PCR (hospital order, performed in [facility] hospital lab) *cepheid single result test* Anterior Nasal Swab     Status: None   Collection Time: 02/06/22  1:44 PM   Specimen: Anterior Nasal Swab  Result Value Ref Range Status   SARS Coronavirus 2 by RT PCR NEGATIVE NEGATIVE Final    Comment: (NOTE) SARS-CoV-2 target nucleic acids are NOT DETECTED.  The SARS-CoV-2 RNA is generally detectable in upper and lower respiratory specimens during the acute phase of infection. The lowest concentration of SARS-CoV-2 viral copies this assay can detect is 250 copies / mL. A negative result does not preclude SARS-CoV-2 infection and should not be used as the sole basis for treatment or other patient management decisions.  A negative result may occur with improper specimen collection / handling, submission of specimen  other than nasopharyngeal swab, presence of viral mutation(s) within the areas targeted by this assay, and inadequate number of viral copies (<250 copies / mL). A negative result must be combined with clinical observations, patient history, and epidemiological information.  Fact Sheet for Patients:   RoadLapTop.co.za  Fact Sheet for Healthcare Providers: http://kim-miller.com/  This test is not yet approved or  cleared by the [place] FDA and has been authorized for detection and/or diagnosis of SARS-CoV-2  by FDA under an Emergency Use Authorization (EUA).  This EUA will remain in effect (meaning this test can be used) for the duration of the COVID-19 declaration under Section 564(b)(1) of the Act, 21 U.S.C. section 360bbb-3(b)(1), unless the authorization is terminated or revoked sooner.  Performed at [facility], [address] [place], [place] [address]   Culture, blood (routine x 2)     Status: Abnormal   Collection Time: 02/06/22  1:44 PM   Specimen: BLOOD  Result Value Ref Range Status   Specimen Description   Final    BLOOD RIGHT ANTECUBITAL Performed at [facility], [address] [place], [place] [address]    Special Requests   Final    BOTTLES DRAWN AEROBIC AND ANAEROBIC Blood Culture results may not be optimal due to an inadequate volume of blood received in culture bottles Performed at [facility], [address] [place], [place] [address]    Culture  Setup Time   Final    GRAM NEGATIVE RODS IN BOTH AEROBIC AND ANAEROBIC BOTTLES CRITICAL RESULT CALLED TO, READ BACK BY AND VERIFIED WITH: J JADHIA,[email or web address]@[email or web address]  02/07/22 MK Performed at [facility], [address], [place], [place] [address]    Culture SERRATIA MARCESCENS (A)  Final   Report Status 02/09/2022 FINAL  Final   Organism ID, Bacteria SERRATIA MARCESCENS  Final      Susceptibility   Serratia marcescens - MIC*    CEFAZOLIN >=64 RESISTANT Resistant     CEFEPIME 0.25 SENSITIVE Sensitive     CEFTAZIDIME <=1 SENSITIVE Sensitive     CEFTRIAXONE <=0.25 SENSITIVE Sensitive     CIPROFLOXACIN <=0.25 SENSITIVE Sensitive     GENTAMICIN <=1 SENSITIVE Sensitive     TRIMETH/SULFA <=20 SENSITIVE Sensitive     * SERRATIA MARCESCENS  Blood Culture ID Panel (Reflexed)     Status: Abnormal   Collection Time: 02/06/22  1:44 PM  Result Value Ref Range Status   Enterococcus faecalis NOT DETECTED NOT DETECTED Final   Enterococcus Faecium NOT DETECTED NOT DETECTED Final   Listeria  monocytogenes NOT DETECTED NOT DETECTED Final   Staphylococcus species NOT DETECTED NOT DETECTED Final   Staphylococcus aureus (BCID) NOT DETECTED NOT DETECTED Final   Staphylococcus epidermidis NOT DETECTED NOT DETECTED Final   Staphylococcus lugdunensis NOT DETECTED NOT DETECTED Final   Streptococcus species NOT DETECTED NOT DETECTED Final   Streptococcus agalactiae NOT DETECTED NOT DETECTED Final   Streptococcus pneumoniae NOT DETECTED NOT DETECTED Final   Streptococcus pyogenes NOT DETECTED NOT DETECTED Final   A.calcoaceticus-baumannii NOT DETECTED NOT DETECTED Final   Bacteroides fragilis NOT DETECTED NOT DETECTED Final   Enterobacterales DETECTED (A) NOT DETECTED Final    Comment: Enterobacterales represent a large order of gram negative bacteria, not a single organism. CRITICAL RESULT CALLED TO, READ BACK BY AND VERIFIED WITH: J JADHIA,PHARMD@2035  02/07/22 MK    Enterobacter cloacae complex NOT DETECTED NOT DETECTED Final   Escherichia coli NOT DETECTED NOT DETECTED Final  Klebsiella aerogenes NOT DETECTED NOT DETECTED Final   Klebsiella oxytoca NOT DETECTED NOT DETECTED Final   Klebsiella pneumoniae NOT DETECTED NOT DETECTED Final   Proteus species NOT DETECTED NOT DETECTED Final   Salmonella species NOT DETECTED NOT DETECTED Final   Serratia marcescens DETECTED (A) NOT DETECTED Final    Comment: CRITICAL RESULT CALLED TO, READ BACK BY AND VERIFIED WITH: J JADHIS,PHARMD@2035  02/07/22 MK    Haemophilus influenzae NOT DETECTED NOT DETECTED Final   Neisseria meningitidis NOT DETECTED NOT DETECTED Final   Pseudomonas aeruginosa NOT DETECTED NOT DETECTED Final   Stenotrophomonas maltophilia NOT DETECTED NOT DETECTED Final   Candida albicans NOT DETECTED NOT DETECTED Final   Candida auris NOT DETECTED NOT DETECTED Final   Candida glabrata NOT DETECTED NOT DETECTED Final   Candida krusei NOT DETECTED NOT DETECTED Final   Candida parapsilosis NOT DETECTED NOT DETECTED Final    Candida tropicalis NOT DETECTED NOT DETECTED Final   Cryptococcus neoformans/gattii NOT DETECTED NOT DETECTED Final   CTX-M ESBL NOT DETECTED NOT DETECTED Final   Carbapenem resistance IMP NOT DETECTED NOT DETECTED Final   Carbapenem resistance KPC NOT DETECTED NOT DETECTED Final   Carbapenem resistance NDM NOT DETECTED NOT DETECTED Final   Carbapenem resist OXA 48 LIKE NOT DETECTED NOT DETECTED Final   Carbapenem resistance VIM NOT DETECTED NOT DETECTED Final    Comment: Performed at [facility], [address], [place], [place] [address]  Urine Culture     Status: Abnormal   Collection Time: 02/06/22  3:44 PM   Specimen: Urine, Clean Catch  Result Value Ref Range Status   Specimen Description   Final    URINE, CLEAN CATCH Performed at [facility], [address] [place], [place] [address]    Special Requests   Final    NONE Performed at [facility], [address] [place], [place] [address]    Culture MULTIPLE SPECIES PRESENT, SUGGEST RECOLLECTION (A)  Final   Report Status 02/08/2022 FINAL  Final  Culture, blood (routine x 2)     Status: None (Preliminary result)   Collection Time: 02/06/22  4:20 PM   Specimen: BLOOD  Result Value Ref Range Status   Specimen Description   Final    BLOOD BLOOD LEFT ARM Performed at [facility], [address] [place], [place] [address]    Special Requests   Final    BOTTLES DRAWN AEROBIC AND ANAEROBIC Blood Culture adequate volume Performed at [facility], [address], [place], [place] [address]    Culture   Final    NO GROWTH 3 DAYS Performed at [facility], [address], [place], [place] [address]    Report Status PENDING  Incomplete  Urine Culture     Status: Abnormal   Collection Time: 02/06/22 10:33 PM   Specimen: Urine, Cystoscope  Result Value Ref Range Status   Specimen Description   Final    URINE, CLEAN CATCH LEFT URETERAL STONE, SEPSIS CYSTOSCOPY Performed  at [facility], [address], [place], [place] [address]    Special Requests   Final    NONE Performed at [facility], [address], [place], [place] [address]    Culture >=100,000 COLONIES/mL SERRATIA MARCESCENS (A)  Final   Report Status 02/09/2022 FINAL  Final   Organism ID, Bacteria SERRATIA MARCESCENS (A)  Final      Susceptibility  Serratia marcescens - MIC*    CEFAZOLIN >=64 RESISTANT Resistant     CEFEPIME <=0.12 SENSITIVE Sensitive     CEFTRIAXONE <=0.25 SENSITIVE Sensitive     CIPROFLOXACIN <=0.25 SENSITIVE Sensitive     GENTAMICIN <=1 SENSITIVE Sensitive     NITROFURANTOIN 128 RESISTANT Resistant     TRIMETH/SULFA <=20 SENSITIVE Sensitive     * >=100,000 COLONIES/mL SERRATIA MARCESCENS     Time coordinating discharge:  I have spent 35 minutes face to face with the patient and on the ward discussing the patients care, assessment, plan and disposition with other care givers. >50% of the time was devoted counseling the patient about the risks and benefits of treatment/Discharge disposition and coordinating care.   SIGNED:   Dimple Nanas, MD  [facility] 02/09/2022, 1:19 PM   If 7PM-7AM, please contact night-coverage

## 2022-02-09 NOTE — TOC CM/SW Note (Signed)
  Transition of Care [facility]) Screening Note   Patient Details  Name: Jill Odom Date of Birth: [birth year]   Transition of Care [facility]) CM/SW Contact:    Darleene Cleaver, LCSW Phone Number: 02/09/2022, 11:25 AM    Transition of Care Department [facility]) has reviewed patient and no TOC needs have been identified at this time. We will continue to monitor patient advancement through interdisciplinary progression rounds. If new patient transition needs arise, please place a TOC consult.

## 2022-02-09 NOTE — Progress Notes (Signed)
Patient being discharge home. PIV and tele removed per order. Discharge teaching completed and all questions answered. Patient sent home with all personal belongings.

## 2022-02-10 NOTE — Patient Instructions (Addendum)
SURGICAL WAITING ROOM VISITATION Patients having surgery or a procedure may have no more than 2 support people in the waiting area - these visitors may rotate.   Children under the age of 35 must have an adult with them who is not the patient. If the patient needs to stay at the hospital during part of their recovery, the visitor guidelines for inpatient rooms apply. Pre-op nurse will coordinate an appropriate time for 1 support person to accompany patient in pre-op.  This support person may not rotate.    Please refer to the [facility] website for the visitor guidelines for Inpatients (after your surgery is over and you are in a regular room).      Your procedure is scheduled on: 02-23-22   Report to [facility] Main Entrance    Report to admitting at 8:45 AM   Call this number if you have problems the morning of surgery [phone]   Do not eat food :After Midnight.   After Midnight you may have the following liquids until 8:00 AM DAY OF SURGERY  Water Non-Citrus Juices (without pulp, NO RED) Carbonated Beverages Black Coffee (NO MILK/CREAM OR CREAMERS, sugar ok)  Clear Tea (NO MILK/CREAM OR CREAMERS, sugar ok) regular and decaf                             Plain Jell-O (NO RED)                                           Fruit ices (not with fruit pulp, NO RED)                                     Popsicles (NO RED)                                                               Sports drinks like Gatorade (NO RED)                       If you have questions, please contact your surgeon's office.   FOLLOW ANY ADDITIONAL PRE OP INSTRUCTIONS YOU RECEIVED FROM YOUR SURGEON'S OFFICE!!!     Oral Hygiene is also important to reduce your risk of infection.                                    Remember - BRUSH YOUR TEETH THE MORNING OF SURGERY WITH YOUR REGULAR TOOTHPASTE   Do NOT smoke after Midnight   Take these medicines the morning of surgery with A SIP OF WATER:  Trimethoprim, Tylenol if needed                             You may not have any metal on your body including hair pins, jewelry, and body piercing             Do not wear make-up, lotions, powders, perfumes,  or deodorant  Do not wear nail polish including gel and S&S, artificial/acrylic nails, or any other type of covering on natural nails including finger and toenails. If you have artificial nails, gel coating, etc. that needs to be removed by a nail salon please have this removed prior to surgery or surgery may need to be canceled/ delayed if the surgeon/ anesthesia feels like they are unable to be safely monitored.   Do not shave  48 hours prior to surgery.    Do not bring valuables to the hospital. [facility] IS NOT RESPONSIBLE   FOR VALUABLES.   Contacts, dentures or bridgework may not be worn into surgery.   DO NOT BRING YOUR HOME MEDICATIONS TO THE HOSPITAL. PHARMACY WILL DISPENSE MEDICATIONS LISTED ON YOUR MEDICATION LIST TO YOU DURING YOUR ADMISSION IN THE HOSPITAL!   Patients discharged on the day of surgery will not be allowed to drive home.  Someone NEEDS to stay with you for the first 24 hours after anesthesia.  Please read over the following fact sheets you were given: IF YOU HAVE QUESTIONS ABOUT YOUR PRE-OP INSTRUCTIONS PLEASE CALL [phone] [facility] - Preparing for Surgery Before surgery, you can play an important role.  Because skin is not sterile, your skin needs to be as free of germs as possible.  You can reduce the number of germs on your skin by washing with CHG (chlorahexidine gluconate) soap before surgery.  CHG is an antiseptic cleaner which kills germs and bonds with the skin to continue killing germs even after washing. Please DO NOT use if you have an allergy to CHG or antibacterial soaps.  If your skin becomes reddened/irritated stop using the CHG and inform your nurse when you arrive at Short Stay. Do not shave (including legs and underarms) for at  least 48 hours prior to the first CHG shower.  You may shave your face/neck.  Please follow these instructions carefully:  1.  Shower with CHG Soap the night before surgery and the  morning of surgery.  2.  If you choose to wash your hair, wash your hair first as usual with your normal  shampoo.  3.  After you shampoo, rinse your hair and body thoroughly to remove the shampoo.                             4.  Use CHG as you would any other liquid soap.  You can apply chg directly to the skin and wash.  Gently with a scrungie or clean washcloth.  5.  Apply the CHG Soap to your body ONLY FROM THE NECK DOWN.   Do   not use on face/ open                           Wound or open sores. Avoid contact with eyes, ears mouth and   genitals (private parts).                       Wash face,  Genitals (private parts) with your normal soap.             6.  Wash thoroughly, paying special attention to the area where your    surgery  will be performed.  7.  Thoroughly rinse your body with warm water from the neck down.  8.  DO NOT shower/wash with your normal soap  after using and rinsing off the CHG Soap.                9.  Pat yourself dry with a clean towel.            10.  Wear clean pajamas.            11.  Place clean sheets on your bed the night of your first shower and do not  sleep with pets. Day of Surgery : Do not apply any lotions/deodorants the morning of surgery.  Please wear clean clothes to the hospital/surgery center.  FAILURE TO FOLLOW THESE INSTRUCTIONS MAY RESULT IN THE CANCELLATION OF YOUR SURGERY  PATIENT SIGNATURE_________________________________  NURSE SIGNATURE__________________________________  ________________________________________________________________________

## 2022-02-11 NOTE — Progress Notes (Addendum)
COVID Vaccine Completed:  Yes  Date of COVID positive in last 90 days:  No  PCP - Virl Son, MD Cardiologist - N/A Pulmonologist - Sandrea Hughs, MD  Chest x-ray - 02-06-22 Epic EKG - 02-06-22 Epic Stress Test -  N/A ECHO - greater than 2 years Epic Cardiac Cath -  N/A Pacemaker/ICD device last checked: Spinal Cord Stimulator: N/A  Bowel Prep -  N/A  Sleep Study -  N/A CPAP -   Fasting Blood Sugar -  N/A Checks Blood Sugar _____ times a day  Blood Thinner Instructions: N/A Aspirin Instructions: Last Dose:  Activity level:   Can go up a flight of stairs and perform activities of daily living without stopping and without symptoms of chest pain or shortness of breath.  Anesthesia review:  Hemoglobin 8.4 on PAT labs  Patient denies shortness of breath, fever, cough and chest pain at PAT appointment  Patient verbalized understanding of instructions that were given to them at the PAT appointment. Patient was also instructed that they will need to review over the PAT instructions again at home before surgery.

## 2022-02-12 LAB — CULTURE, BLOOD (ROUTINE X 2): Special Requests: ADEQUATE

## 2022-02-16 ENCOUNTER — Other Ambulatory Visit: Payer: Self-pay

## 2022-02-16 ENCOUNTER — Encounter (HOSPITAL_COMMUNITY): Payer: Self-pay

## 2022-02-16 ENCOUNTER — Encounter (HOSPITAL_COMMUNITY)
Admission: RE | Admit: 2022-02-16 | Discharge: 2022-02-16 | Disposition: A | Discharge status: Home / Self Care | Payer: Managed Care, Other (non HMO) | Source: Ambulatory Visit | Attending: Urology | Admitting: Urology

## 2022-02-16 VITALS — BP 135/82 | HR 96 | Temp 98.3°F | Resp 18 | Ht 64.0 in | Wt 387.6 lb

## 2022-02-16 DIAGNOSIS — Z01812 Encounter for preprocedural laboratory examination: Secondary | ICD-10-CM | POA: Insufficient documentation

## 2022-02-16 DIAGNOSIS — D649 Anemia, unspecified: Secondary | ICD-10-CM | POA: Diagnosis not present

## 2022-02-16 DIAGNOSIS — Z01818 Encounter for other preprocedural examination: Secondary | ICD-10-CM

## 2022-02-16 HISTORY — DX: Anemia, unspecified: D64.9

## 2022-02-16 HISTORY — DX: Anxiety disorder, unspecified: F41.9

## 2022-02-16 HISTORY — DX: Personal history of urinary calculi: Z87.442

## 2022-02-16 LAB — CBC
HCT: 28.9 % — ABNORMAL LOW (ref 36.0–46.0)
Hemoglobin: 8.4 g/dL — ABNORMAL LOW (ref 12.0–15.0)
MCH: 24.1 pg — ABNORMAL LOW (ref 26.0–34.0)
MCHC: 29.1 g/dL — ABNORMAL LOW (ref 30.0–36.0)
MCV: 82.8 fL (ref 80.0–100.0)
Platelets: 458 10*3/uL — ABNORMAL HIGH (ref 150–400)
RBC: 3.49 MIL/uL — ABNORMAL LOW (ref 3.87–5.11)
RDW: 18.1 % — ABNORMAL HIGH (ref 11.5–15.5)
WBC: 10.3 10*3/uL (ref 4.0–10.5)
nRBC: 0 % (ref 0.0–0.2)

## 2022-02-16 NOTE — Progress Notes (Signed)
[facility] requested with portable equipment for surgery 02-23-22.

## 2022-02-23 ENCOUNTER — Ambulatory Visit (HOSPITAL_COMMUNITY)
Admission: RE | Admit: 2022-02-23 | Discharge: 2022-02-23 | Disposition: A | Discharge status: Home / Self Care | Payer: Managed Care, Other (non HMO) | Source: Ambulatory Visit | Attending: Urology | Admitting: Urology

## 2022-02-23 ENCOUNTER — Encounter (HOSPITAL_COMMUNITY)
Admission: RE | Disposition: A | Discharge status: Home / Self Care | Payer: Self-pay | Source: Ambulatory Visit | Attending: Urology

## 2022-02-23 ENCOUNTER — Encounter (HOSPITAL_COMMUNITY): Payer: Self-pay | Admitting: Urology

## 2022-02-23 ENCOUNTER — Ambulatory Visit (HOSPITAL_BASED_OUTPATIENT_CLINIC_OR_DEPARTMENT_OTHER): Payer: Managed Care, Other (non HMO) | Admitting: Certified Registered"

## 2022-02-23 ENCOUNTER — Ambulatory Visit (HOSPITAL_COMMUNITY): Payer: Managed Care, Other (non HMO)

## 2022-02-23 ENCOUNTER — Other Ambulatory Visit: Payer: Self-pay

## 2022-02-23 ENCOUNTER — Ambulatory Visit (HOSPITAL_COMMUNITY): Payer: Managed Care, Other (non HMO) | Admitting: Physician Assistant

## 2022-02-23 DIAGNOSIS — K219 Gastro-esophageal reflux disease without esophagitis: Secondary | ICD-10-CM | POA: Diagnosis not present

## 2022-02-23 DIAGNOSIS — Z87891 Personal history of nicotine dependence: Secondary | ICD-10-CM | POA: Insufficient documentation

## 2022-02-23 DIAGNOSIS — Z6841 Body Mass Index (BMI) 40.0 and over, adult: Secondary | ICD-10-CM

## 2022-02-23 DIAGNOSIS — N201 Calculus of ureter: Secondary | ICD-10-CM | POA: Insufficient documentation

## 2022-02-23 DIAGNOSIS — Z01818 Encounter for other preprocedural examination: Secondary | ICD-10-CM

## 2022-02-23 DIAGNOSIS — D649 Anemia, unspecified: Secondary | ICD-10-CM

## 2022-02-23 DIAGNOSIS — J45909 Unspecified asthma, uncomplicated: Secondary | ICD-10-CM | POA: Insufficient documentation

## 2022-02-23 HISTORY — PX: CYSTOSCOPY/URETEROSCOPY/HOLMIUM LASER/STENT PLACEMENT: SHX6546

## 2022-02-23 LAB — POCT PREGNANCY, URINE: Preg Test, Ur: NEGATIVE

## 2022-02-23 SURGERY — CYSTOSCOPY/URETEROSCOPY/HOLMIUM LASER/STENT PLACEMENT
Anesthesia: General | Laterality: Left

## 2022-02-23 MED ORDER — ORAL CARE MOUTH RINSE: 15.0000 mL | Freq: Once | OROMUCOSAL | Status: AC

## 2022-02-23 MED ORDER — HYDROCODONE-ACETAMINOPHEN 5-325 MG PO TABS: 1.0000 | ORAL_TABLET | ORAL | 0 refills | Status: AC | PRN

## 2022-02-23 MED ORDER — SODIUM CHLORIDE 0.9% FLUSH: 3.0000 mL | Freq: Two times a day (BID) | INTRAVENOUS | Status: DC

## 2022-02-23 MED ORDER — DEXAMETHASONE SODIUM PHOSPHATE 10 MG/ML IJ SOLN
INTRAMUSCULAR | Status: AC
  Filled 2022-02-23: qty 1

## 2022-02-23 MED ORDER — ONDANSETRON HCL 4 MG/2ML IJ SOLN
INTRAMUSCULAR | Status: DC | PRN
  Administered 2022-02-23: 4 mg via INTRAVENOUS

## 2022-02-23 MED ORDER — DEXAMETHASONE SODIUM PHOSPHATE 10 MG/ML IJ SOLN
INTRAMUSCULAR | Status: DC | PRN
  Administered 2022-02-23: 10 mg via INTRAVENOUS

## 2022-02-23 MED ORDER — LIDOCAINE 2% (20 MG/ML) 5 ML SYRINGE
INTRAMUSCULAR | Status: DC | PRN
  Administered 2022-02-23: 100 mg via INTRAVENOUS

## 2022-02-23 MED ORDER — LIDOCAINE 2% (20 MG/ML) 5 ML SYRINGE
INTRAMUSCULAR | Status: AC
  Filled 2022-02-23: qty 5

## 2022-02-23 MED ORDER — MIDAZOLAM HCL 2 MG/2ML IJ SOLN
INTRAMUSCULAR | Status: AC
  Filled 2022-02-23: qty 2

## 2022-02-23 MED ORDER — PROMETHAZINE HCL 25 MG/ML IJ SOLN: 6.2500 mg | INTRAMUSCULAR | Status: DC | PRN

## 2022-02-23 MED ORDER — AMISULPRIDE (ANTIEMETIC) 5 MG/2ML IV SOLN: 10.0000 mg | Freq: Once | INTRAVENOUS | Status: DC | PRN

## 2022-02-23 MED ORDER — LACTATED RINGERS IV SOLN: INTRAVENOUS | Status: DC

## 2022-02-23 MED ORDER — OXYCODONE HCL 5 MG PO TABS: 5.0000 mg | ORAL_TABLET | Freq: Once | ORAL | Status: DC | PRN

## 2022-02-23 MED ORDER — SODIUM CHLORIDE 0.9 % IV SOLN: INTRAVENOUS | Status: DC

## 2022-02-23 MED ORDER — ONDANSETRON HCL 4 MG/2ML IJ SOLN
INTRAMUSCULAR | Status: AC
  Filled 2022-02-23: qty 2

## 2022-02-23 MED ORDER — FENTANYL CITRATE (PF) 100 MCG/2ML IJ SOLN
INTRAMUSCULAR | Status: AC
  Filled 2022-02-23: qty 2

## 2022-02-23 MED ORDER — CHLORHEXIDINE GLUCONATE 0.12 % MT SOLN
15.0000 mL | Freq: Once | OROMUCOSAL | Status: AC
  Administered 2022-02-23: 15 mL via OROMUCOSAL

## 2022-02-23 MED ORDER — MIDAZOLAM HCL 5 MG/5ML IJ SOLN
INTRAMUSCULAR | Status: DC | PRN
  Administered 2022-02-23: 2 mg via INTRAVENOUS

## 2022-02-23 MED ORDER — FENTANYL CITRATE (PF) 100 MCG/2ML IJ SOLN
INTRAMUSCULAR | Status: DC | PRN
  Administered 2022-02-23 (×2): 50 ug via INTRAVENOUS

## 2022-02-23 MED ORDER — HYDROMORPHONE HCL 1 MG/ML IJ SOLN: 0.2500 mg | INTRAMUSCULAR | Status: DC | PRN

## 2022-02-23 MED ORDER — PROPOFOL 10 MG/ML IV BOLUS
INTRAVENOUS | Status: AC
  Filled 2022-02-23: qty 20

## 2022-02-23 MED ORDER — SODIUM CHLORIDE 0.9 % IV SOLN
2.0000 g | INTRAVENOUS | Status: AC
  Administered 2022-02-23: 2 g via INTRAVENOUS
  Filled 2022-02-23: qty 20

## 2022-02-23 MED ORDER — OXYCODONE HCL 5 MG/5ML PO SOLN: 5.0000 mg | Freq: Once | ORAL | Status: DC | PRN

## 2022-02-23 MED ORDER — PROPOFOL 10 MG/ML IV BOLUS
INTRAVENOUS | Status: DC | PRN
  Administered 2022-02-23: 200 mg via INTRAVENOUS

## 2022-02-23 SURGICAL SUPPLY — 23 items
BAG URO CATCHER STRL LF (MISCELLANEOUS) ×1 IMPLANT
BASKET STONE NCOMPASS (UROLOGICAL SUPPLIES) IMPLANT
CATH URETERAL DUAL LUMEN 10F (MISCELLANEOUS) IMPLANT
CATH URETL OPEN 5X70 (CATHETERS) IMPLANT
CLOTH BEACON ORANGE TIMEOUT ST (SAFETY) ×1 IMPLANT
EXTRACTOR STONE NITINOL NGAGE (UROLOGICAL SUPPLIES) IMPLANT
GLOVE SURG SS PI 8.0 STRL IVOR (GLOVE) ×1 IMPLANT
GOWN STRL REUS W/ TWL XL LVL3 (GOWN DISPOSABLE) ×1 IMPLANT
GOWN STRL REUS W/TWL XL LVL3 (GOWN DISPOSABLE) ×1
GUIDEWIRE STR DUAL SENSOR (WIRE) ×1 IMPLANT
IV NS IRRIG 3000ML ARTHROMATIC (IV SOLUTION) ×1 IMPLANT
KIT TURNOVER KIT A (KITS) IMPLANT
LASER FIB FLEXIVA PULSE ID 365 (Laser) IMPLANT
LASER FIB FLEXIVA PULSE ID 550 (Laser) IMPLANT
LASER FIB FLEXIVA PULSE ID 910 (Laser) IMPLANT
MANIFOLD NEPTUNE II (INSTRUMENTS) ×1 IMPLANT
PACK CYSTO (CUSTOM PROCEDURE TRAY) ×1 IMPLANT
SHEATH NAVIGATOR HD 11/13X36 (SHEATH) IMPLANT
STENT URET 6FRX24 CONTOUR (STENTS) IMPLANT
TRACTIP FLEXIVA PULS ID 200XHI (Laser) IMPLANT
TRACTIP FLEXIVA PULSE ID 200 (Laser) ×1
TUBING CONNECTING 10 (TUBING) ×1 IMPLANT
TUBING UROLOGY SET (TUBING) ×1 IMPLANT

## 2022-02-23 NOTE — Anesthesia Preprocedure Evaluation (Signed)
Anesthesia Evaluation  Patient identified by MRN, date of birth, ID band Patient awake    Reviewed: Allergy & Precautions, NPO status , Patient's Chart, lab work & pertinent test results  Airway Mallampati: II  TM Distance: >3 FB Neck ROM: Full    Dental no notable dental hx. (+) Teeth Intact, Dental Advisory Given   Pulmonary asthma , former smoker,    Pulmonary exam normal breath sounds clear to auscultation       Cardiovascular Exercise Tolerance: Good Normal cardiovascular exam Rhythm:Regular Rate:Normal     Neuro/Psych    GI/Hepatic Neg liver ROS, GERD  ,  Endo/Other  Morbid obesity (66.1)  Renal/GU Renal diseasenephrolithiasis     Musculoskeletal   Abdominal (+) + obese,   Peds  Hematology  (+) Blood dyscrasia, anemia , Lab Results      Component                Value               Date                      WBC                      16.6 (H)            02/06/2022                HGB                      10.1 (L)            02/06/2022                HCT                      33.0 (L)            02/06/2022                MCV                      80.3                02/06/2022                PLT                      313                 02/06/2022              Anesthesia Other Findings   Reproductive/Obstetrics negative OB ROS                             Anesthesia Physical  Anesthesia Plan  ASA: 3  Anesthesia Plan: General   Post-op Pain Management: Dilaudid IV   Induction: Intravenous  PONV Risk Score and Plan: 3 and Treatment may vary due to age or medical condition, Midazolam, Ondansetron and Dexamethasone  Airway Management Planned: LMA  Additional Equipment: None  Intra-op Plan:   Post-operative Plan: Extubation in OR  Informed Consent:     Dental advisory given  Plan Discussed with: CRNA  Anesthesia Plan Comments:         Anesthesia Quick  Evaluation

## 2022-02-23 NOTE — Anesthesia Procedure Notes (Signed)
Procedure Name: LMA Insertion Date/Time: 02/23/2022 11:20 AM  Performed by: Alli Jasmer D, CRNAPre-anesthesia Checklist: Patient identified, Emergency Drugs available, Suction available and Patient being monitored Patient Re-evaluated:Patient Re-evaluated prior to induction Oxygen Delivery Method: Circle system utilized Preoxygenation: Pre-oxygenation with 100% oxygen Induction Type: IV induction Ventilation: Mask ventilation without difficulty LMA: LMA inserted LMA Size: 4.0 Tube type: Oral Number of attempts: 1 Placement Confirmation: positive ETCO2 and breath sounds checked- equal and bilateral Tube secured with: Tape Dental Injury: Teeth and Oropharynx as per pre-operative assessment

## 2022-02-23 NOTE — Transfer of Care (Signed)
Immediate Anesthesia Transfer of Care Note  Patient: Jill Odom  Procedure(s) Performed: CYSTOSCOPY/URETEROSCOPY/HOLMIUM LASER/STENT EXCHANGE (Left)  Patient Location: PACU  Anesthesia Type:General  Level of Consciousness: awake, alert  and oriented  Airway & Oxygen Therapy: Patient Spontanous Breathing and Patient connected to face mask oxygen  Post-op Assessment: Report given to RN and Post -op Vital signs reviewed and stable  Post vital signs: Reviewed and stable  Last Vitals:  Vitals Value Taken Time  BP 116/68 02/23/22 1212  Temp 36.7 C 02/23/22 1212  Pulse 82 02/23/22 1212  Resp 19 02/23/22 1212  SpO2 100 % 02/23/22 1212  Vitals shown include unvalidated device data.  Last Pain:  Vitals:   02/23/22 0954  TempSrc:   PainSc: 0-No pain         Complications: No notable events documented.

## 2022-02-23 NOTE — Discharge Instructions (Signed)
There is a string attached to the ureteral stent.  The string is tucked vaginally like a tampon string.  Be careful with the string and we will use it to remove the stent when you return to the office on 8/29.   We have sent the stone fragments to the lab.     Please continue taking the nightly antibiotic, trimethoprim.  I will want that to continue for a total of 3 months some make sure you get the med refilled.

## 2022-02-23 NOTE — Op Note (Signed)
Procedure: 1.  Cystoscopy with left ureteroscopy with holmium laser application, stone extraction and exchange of left double-J stent. 2.  Application of fluoroscopy.  Preop diagnosis: Left proximal ureteral stone.  Postop diagnosis: Left lower pole stone.  Surgeon: Dr. Bjorn Pippin.  Anesthesia: General.  Specimen: Stone fragments.  Sent to pathology.  Drain: 6 [place] by 24 cm left contour double-J stent with tether.  EBL: None.  Complications: None.  Indications: The patient is a 36 year old female who was previously seen for an 11 mm obstructing left UPJ stone with sepsis.  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  After completion of her therapeutic antibiotic she was placed on trimethoprim for suppression.  She returns now for stone management.  Procedure: She was taken the operating room where general anesthetic was induced.  She was placed in lithotomy position and fitted with PAS hose.  Her perineum and genitalia were prepped with Betadine solution and she was draped in usual sterile fashion.  Cystoscopy was performed using a 21 [place] scope and 30 degree lens.  The stent loop was visualized at the left ureteral orifice with some surrounding edema.  No new bladder findings were noted otherwise.  The stent was grasped with a grasping forceps and pulled to the urethral meatus.  A sensor wire was then advanced to the kidney through the stent under fluoroscopic guidance and the stent was removed.  A 36 cm 11/13 [place] digital access sheath inner core was then used to dilate the ureter under fluoroscopic guidance.  The assembled sheath was then passed to the proximal ureter and the inner core and wire removed.  The dual-lumen digital flexible scope was then inserted to the kidney under visual guidance and the collecting system was inspected.  The only stone identified was in the lower collecting system and was consistent with the previously UPJ  located stone seen on CT.  The stone had a yellowish-tan appearance consistent with struvite.  The stone was then fragmented using a 200 m laser fiber on the dusting setting.  A few fragments were sufficiently large that they were removable with the engage basket.  Once all the significant fragments had been removed and only dust remained, the ureteroscope was removed.  A sensor wire was then advanced to the kidney under fluoroscopic guidance and the access sheath was removed.  The cystoscope was reinserted over the wire and a 6 [place] by 24 cm contour double-J stent with tether was advanced to the kidney under fluoroscopic guidance.  The wire was removed, and a good coil in the kidney and a good coil in the bladder.  The bladder was drained and the cystoscope was removed leaving the stent string exiting urethra.  The string was knotted close to the urethral meatus, trimmed to an appropriate length and tucked vaginally.  Final fluoroscopy revealed good stent position.  She was taken down from lithotomy position, her anesthetic was reversed and she was moved to recovery in stable condition.  There were no complications.

## 2022-02-23 NOTE — Interval H&P Note (Signed)
History and Physical Interval Note:  She has been on TMP for suppression and has been tolerating the stent.   02/23/2022 10:33 AM  Jill Odom  has presented today for surgery, with the diagnosis of LEFT URETERAL STONE.  The various methods of treatment have been discussed with the patient and family. After consideration of risks, benefits and other options for treatment, the patient has consented to  Procedure(s): CYSTOSCOPY/URETEROSCOPY/HOLMIUM LASER/STENT EXCHANGE (Left) as a surgical intervention.  The patient's history has been reviewed, patient examined, no change in status, stable for surgery.  I have reviewed the patient's chart and labs.  Questions were answered to the patient's satisfaction.     Bjorn Pippin

## 2022-02-23 NOTE — Progress Notes (Signed)
Call to Dr. Annabell Howells re: ABX dose of 3G rocephin. Per pharmacist Azzie Glatter there is not a dose available of 3 G. Patient's culture shows organism resistant to Ancef. Per Dr. Annabell Howells ok to proceed with 2 G rocephin. Azzie Glatter aware and orders adjusted.  Rocephin is not compatiable with LR. Must use NS per pharmacy. LR d/c and NS ordered.

## 2022-02-23 NOTE — Anesthesia Postprocedure Evaluation (Signed)
Anesthesia Post Note  Patient: Jill Odom  Procedure(s) Performed: CYSTOSCOPY/URETEROSCOPY/HOLMIUM LASER/STENT EXCHANGE (Left)     Patient location during evaluation: PACU Anesthesia Type: General Level of consciousness: awake and alert Pain management: pain level controlled Vital Signs Assessment: post-procedure vital signs reviewed and stable Respiratory status: spontaneous breathing, nonlabored ventilation and respiratory function stable Cardiovascular status: blood pressure returned to baseline and stable Postop Assessment: no apparent nausea or vomiting Anesthetic complications: no   No notable events documented.  Last Vitals:  Vitals:   02/23/22 1245 02/23/22 1303  BP: 127/77 (!) 149/93  Pulse: 84 79  Resp: (!) 21 12  Temp: 36.6 C   SpO2: 96% 99%    Last Pain:  Vitals:   02/23/22 1245  TempSrc:   PainSc: 0-No pain                 Lowella Curb

## 2022-02-24 ENCOUNTER — Encounter (HOSPITAL_COMMUNITY): Payer: Self-pay | Admitting: Urology

## 2022-03-03 LAB — CALCULI, WITH PHOTOGRAPH (CLINICAL LAB)
Carbonate Apatite: 100 %
Weight Calculi: 6 mg

## 2022-07-07 ENCOUNTER — Emergency Department (HOSPITAL_BASED_OUTPATIENT_CLINIC_OR_DEPARTMENT_OTHER): Payer: Managed Care, Other (non HMO)

## 2022-07-07 ENCOUNTER — Encounter (HOSPITAL_BASED_OUTPATIENT_CLINIC_OR_DEPARTMENT_OTHER): Payer: Self-pay

## 2022-07-07 ENCOUNTER — Emergency Department (HOSPITAL_BASED_OUTPATIENT_CLINIC_OR_DEPARTMENT_OTHER)
Admission: EM | Admit: 2022-07-07 | Discharge: 2022-07-07 | Disposition: A | Discharge status: Home / Self Care | Payer: Managed Care, Other (non HMO) | Attending: Emergency Medicine | Admitting: Emergency Medicine

## 2022-07-07 DIAGNOSIS — J45909 Unspecified asthma, uncomplicated: Secondary | ICD-10-CM | POA: Insufficient documentation

## 2022-07-07 DIAGNOSIS — Z1152 Encounter for screening for COVID-19: Secondary | ICD-10-CM | POA: Insufficient documentation

## 2022-07-07 DIAGNOSIS — J069 Acute upper respiratory infection, unspecified: Secondary | ICD-10-CM | POA: Diagnosis not present

## 2022-07-07 DIAGNOSIS — Z7951 Long term (current) use of inhaled steroids: Secondary | ICD-10-CM | POA: Diagnosis not present

## 2022-07-07 DIAGNOSIS — R059 Cough, unspecified: Secondary | ICD-10-CM | POA: Diagnosis present

## 2022-07-07 LAB — RESP PANEL BY RT-PCR (RSV, FLU A&B, COVID)  RVPGX2
Influenza A by PCR: NEGATIVE
Influenza B by PCR: NEGATIVE
Resp Syncytial Virus by PCR: NEGATIVE
SARS Coronavirus 2 by RT PCR: NEGATIVE

## 2022-07-07 MED ORDER — BENZONATATE 100 MG PO CAPS
100.0000 mg | ORAL_CAPSULE | Freq: Three times a day (TID) | ORAL | 0 refills | Status: DC
Start: 2022-07-07 — End: 2024-02-28

## 2022-07-07 MED ORDER — NIRMATRELVIR/RITONAVIR (PAXLOVID)TABLET: 3.0000 | ORAL_TABLET | Freq: Two times a day (BID) | ORAL | 0 refills | Status: DC

## 2022-07-07 MED ORDER — FLUTICASONE PROPIONATE 50 MCG/ACT NA SUSP: 2.0000 | Freq: Every day | NASAL | 0 refills | Status: AC

## 2022-07-07 MED ORDER — NIRMATRELVIR/RITONAVIR (PAXLOVID)TABLET: 3.0000 | ORAL_TABLET | Freq: Two times a day (BID) | ORAL | 0 refills | Status: AC

## 2022-07-07 MED ORDER — LOPERAMIDE HCL 2 MG PO CAPS: 2.0000 mg | ORAL_CAPSULE | Freq: Four times a day (QID) | ORAL | 0 refills | Status: DC | PRN

## 2022-07-07 NOTE — Discharge Instructions (Addendum)
You were seen in the emergency room today with flulike symptoms.  Your chest x-ray does not show pneumonia.  I have attached a prescription for Paxlovid.  Please follow the respiratory results in the MyChart app and take Paxlovid only if your COVID (SARS Coronavirs 2) test comes back positive.   Return with any new or worsening symptoms.

## 2022-07-07 NOTE — ED Provider Notes (Signed)
Emergency Department Provider Note   I have reviewed the triage vital signs and the nursing notes.   HISTORY  Chief Complaint Cough   HPI Jill Odom is a 37 y.o. female with PMH of asthma and GERD presents to ED for evaluation of cough with weakness and fever.  She reports congestion and poor appetite.  No chest pain or shortness of breath.  No particular wheezing.  No vomiting or diarrhea.   Past Medical History:  Diagnosis Date   Allergy    Anemia    Anxiety    Asthma    GERD (gastroesophageal reflux disease)    History of kidney stones     Review of Systems  Constitutional: Positive fever/chills and body aches.  ENT: Positive sore throat. Cardiovascular: Denies chest pain. Respiratory: Denies shortness of breath. Positive cough.  Gastrointestinal: No abdominal pain.   Genitourinary: Negative for dysuria. Musculoskeletal: Negative for back pain. Skin: Negative for rash. ____________________________________________   PHYSICAL EXAM:  VITAL SIGNS: ED Triage Vitals  Enc Vitals Group     BP 07/07/22 0827 113/75     Pulse Rate 07/07/22 0827 91     Resp 07/07/22 0827 20     Temp 07/07/22 0827 98.5 F (36.9 C)     Temp src --      SpO2 07/07/22 0827 100 %     Weight 07/07/22 0823 (!) 390 lb (176.9 kg)     Height 07/07/22 0823 5\' 4"  (1.626 m)   Constitutional: Alert and oriented. Well appearing and in no acute distress. Eyes: Conjunctivae are normal. Head: Atraumatic. Nose: Positive congestion/rhinnorhea. Mouth/Throat: Mucous membranes are moist.  Oropharynx non-erythematous. Neck: No stridor.   Cardiovascular: Normal rate, regular rhythm. Good peripheral circulation. Grossly normal heart sounds.   Respiratory: Normal respiratory effort.  No retractions. Lungs CTAB. Gastrointestinal: No distention.  Musculoskeletal: No lower extremity tenderness nor edema. No gross deformities of extremities. Neurologic:  Normal speech and language. No gross focal  neurologic deficits are appreciated.  Skin:  Skin is warm, dry and intact. No rash noted.   ____________________________________________   LABS (all labs ordered are listed, but only abnormal results are displayed)  Labs Reviewed  RESP PANEL BY RT-PCR (RSV, FLU A&B, COVID)  [id number]    ____________________________________________   PROCEDURES  Procedure(s) performed:   Procedures  None  ____________________________________________   INITIAL IMPRESSION / ASSESSMENT AND PLAN / ED COURSE  Pertinent labs & imaging results that were available during my care of the patient were reviewed by me and considered in my medical decision making (see chart for details).   This patient is Presenting for Evaluation of cough and congestion, which does require a range of treatment options, and is a complaint that involves a high risk of morbidity and mortality.  The Differential Diagnoses includes COVID, Flu, RSV, CAP, etc   Clinical Laboratory Tests Ordered, included viral respiratory panel negative.  Radiologic Tests Ordered, included CXR. I independently interpreted the images and agree with radiology interpretation.   Cardiac Monitor Tracing which shows NSR.    Social Determinants of Health Risk patient is not an active smoker.   Medical Decision Making: Summary:  Patient presents emergency department with cough, congestion, flulike symptoms.  Plan for respiratory panel.  Chest x-ray unremarkable.  Discussed supportive care and to start antivirals if COVID comes back positive.  Reevaluation with update and discussion with   10:06 AM  Called the patient back to inform her that her viral panel came back negative  and that she does not need to take the Paxlovid.  She verbalized understanding over the phone.  Continue with supportive care and PCP follow-up as needed.   Patient's presentation is most consistent with acute illness / injury with system symptoms.   Disposition:  discharge  ____________________________________________  FINAL CLINICAL IMPRESSION(S) / ED DIAGNOSES  Final diagnoses:  Viral URI with cough     NEW OUTPATIENT MEDICATIONS STARTED DURING THIS VISIT:  Discharge Medication List as of 07/07/2022  9:02 AM     START taking these medications   Details  benzonatate (TESSALON) 100 MG capsule Take 1 capsule (100 mg total) by mouth every 8 (eight) hours., Starting Wed 07/07/2022, Normal    fluticasone (FLONASE) 50 MCG/ACT nasal spray Place 2 sprays into both nostrils daily for 7 days., Starting Wed 07/07/2022, Until Wed 07/14/2022, Normal    loperamide (IMODIUM) 2 MG capsule Take 1 capsule (2 mg total) by mouth 4 (four) times daily as needed for diarrhea or loose stools., Starting Wed 07/07/2022, Normal        Note:  This document was prepared using Dragon voice recognition software and may include unintentional dictation errors.  Nanda Quinton, MD, [facility] Emergency Medicine    Icholas Irby, W[facility], MD 07/13/22 [phone]

## 2022-07-07 NOTE — ED Triage Notes (Signed)
Pt states that she has been sick since Sunday with cough, weakness, fever only on Sunday. Congestion, unable to eat.

## 2022-10-17 ENCOUNTER — Encounter (HOSPITAL_BASED_OUTPATIENT_CLINIC_OR_DEPARTMENT_OTHER): Payer: Self-pay

## 2022-10-17 ENCOUNTER — Emergency Department (HOSPITAL_BASED_OUTPATIENT_CLINIC_OR_DEPARTMENT_OTHER): Payer: Managed Care, Other (non HMO)

## 2022-10-17 ENCOUNTER — Emergency Department (HOSPITAL_BASED_OUTPATIENT_CLINIC_OR_DEPARTMENT_OTHER)
Admission: EM | Admit: 2022-10-17 | Discharge: 2022-10-17 | Disposition: A | Discharge status: Home / Self Care | Payer: Managed Care, Other (non HMO) | Attending: Emergency Medicine | Admitting: Emergency Medicine

## 2022-10-17 ENCOUNTER — Other Ambulatory Visit: Payer: Self-pay

## 2022-10-17 DIAGNOSIS — S29011A Strain of muscle and tendon of front wall of thorax, initial encounter: Secondary | ICD-10-CM | POA: Insufficient documentation

## 2022-10-17 DIAGNOSIS — X58XXXA Exposure to other specified factors, initial encounter: Secondary | ICD-10-CM | POA: Insufficient documentation

## 2022-10-17 DIAGNOSIS — M25562 Pain in left knee: Secondary | ICD-10-CM | POA: Diagnosis not present

## 2022-10-17 DIAGNOSIS — R0781 Pleurodynia: Secondary | ICD-10-CM | POA: Diagnosis present

## 2022-10-17 LAB — URINALYSIS, MICROSCOPIC (REFLEX)

## 2022-10-17 LAB — URINALYSIS, ROUTINE W REFLEX MICROSCOPIC
Bilirubin Urine: NEGATIVE
Glucose, UA: NEGATIVE mg/dL
Ketones, ur: NEGATIVE mg/dL
Leukocytes,Ua: NEGATIVE
Nitrite: NEGATIVE
Protein, ur: NEGATIVE mg/dL
Specific Gravity, Urine: 1.025 (ref 1.005–1.030)
pH: 7 (ref 5.0–8.0)

## 2022-10-17 MED ORDER — LIDOCAINE 5 % EX PTCH
1.0000 | MEDICATED_PATCH | CUTANEOUS | Status: DC
  Administered 2022-10-17: 1 via TRANSDERMAL
  Filled 2022-10-17: qty 1

## 2022-10-17 NOTE — ED Notes (Signed)
D/c paperwork reviewed with pt, including follow up care.  All questions and/or concerns addressed at time of d/c.  No further needs expressed. . Pt verbalized understanding, Ambulatory without assistance to ED exit, NAD.   

## 2022-10-17 NOTE — ED Triage Notes (Addendum)
Pt complains of right lateral rib pain x 1 week. Sharp pains when she attempts to get out of bed. Also complains of left knee pain x 69months  possibly from missing last step of ladder

## 2022-10-17 NOTE — Discharge Instructions (Signed)
Please follow-up with your primary care doctor, and orthopedics as needed.  Make sure that you are resting, and using lidocaine patches, as well as Tylenol ibuprofen for your pain control.  Return to the ER if you have swelling of your leg, chest pain, or shortness of breath.  Try to elevate your knee and ice it.

## 2022-10-17 NOTE — ED Notes (Signed)
Pt ambulatory from triage, steady gait.  °

## 2022-10-17 NOTE — ED Provider Notes (Signed)
[facility] Provider Note   CSN: [id number] Arrival date & time: 10/17/22  1607     History  Chief Complaint  Patient presents with   Chest Pain   Knee Pain    Jill Odom is a 37 y.o. female, no pertinent past medical presents to the ED for 2 different complaints.  She states that about a month ago she was getting off a ladder, and she missed a step, and hurt her knee.  She said for 2 weeks she was fine, but then she started having some pain in the front of her knee.  She states that it has been since been buckling on her, now she is having difficulty putting weight on it.  She states she has tried ice, Tylenol without any relief.  Denies any swelling, redness to the area.  No direct trauma.  Has not fallen directly on it.  Additionally states that she has some right-sided rib cage pain, this been going on for the last week.  She states that it came on kind of all of a sudden, is not associated with any shortness of breath, and feels, but worse when she moves.  She states that turning, makes the pain worse especially when she turns over in bed.  She has tried Tylenol without relief of her symptoms.  She denies any rash, swelling to the area.  Has not had any bloody cough.  Is not on birth control.  Denies any frequent cough.  Denies any urinary symptoms.    Home Medications Prior to Admission medications   Medication Sig Start Date End Date Taking? Authorizing Provider  acetaminophen (TYLENOL) 500 MG tablet Take 1,000 mg by mouth every 6 (six) hours as needed for mild pain.    [provider]  albuterol (VENTOLIN HFA) 108 (90 Base) MCG/ACT inhaler Inhale 1-2 puffs into the lungs every 6 (six) hours as needed for wheezing or shortness of breath. Patient not taking: Reported on 02/07/2022 03/26/21   Long, Arlyss Repress, MD  benzonatate (TESSALON) 100 MG capsule Take 1 capsule (100 mg total) by mouth every 8 (eight) hours. 07/07/22   Long,  Arlyss Repress, MD  fluticasone (FLONASE) 50 MCG/ACT nasal spray Place 2 sprays into both nostrils daily for 7 days. 07/07/22 07/14/22  Long, Arlyss Repress, MD  HUMIRA PEN 40 MG/0.8ML PNKT SMARTSIG:1 Pre-Filled Pen Syringe SUB-Q Once a Week Patient not taking: Reported on 02/07/2022 10/28/20   [provider]  HYDROcodone-acetaminophen (NORCO/VICODIN) 5-325 MG tablet Take 1 tablet by mouth every 4 (four) hours as needed for moderate pain. 02/23/22 02/23/23  Bjorn Pippin, MD  ibuprofen (ADVIL) 200 MG tablet Take 400 mg by mouth every 6 (six) hours as needed for mild pain or headache.    [provider]  loperamide (IMODIUM) 2 MG capsule Take 1 capsule (2 mg total) by mouth 4 (four) times daily as needed for diarrhea or loose stools. 07/07/22   Long, Arlyss Repress, MD  phosphorus (K PHOS NEUTRAL) [phone] MG tablet Take 2 tablets (500 mg total) by mouth 3 (three) times daily. 02/09/22   Dimple Nanas, MD      Allergies    Caffeine    Review of Systems   Review of Systems  Respiratory:  Negative for shortness of breath.   Cardiovascular:  Positive for chest pain.    Physical Exam Updated Vital Signs BP 125/81 (BP Location: Left Arm)   Pulse 86   Temp 98.6 F (  37 C) (Oral)   Resp 20   Wt (!) 176.9 kg   LMP 09/27/2022   SpO2 100%   BMI 66.94 kg/m  Physical Exam Vitals and nursing note reviewed.  Constitutional:      General: She is not in acute distress.    Appearance: She is obese.  HENT:     Head: Normocephalic and atraumatic.  Eyes:     Conjunctiva/sclera: Conjunctivae normal.  Cardiovascular:     Rate and Rhythm: Normal rate and regular rhythm.     Heart sounds: No murmur heard. Pulmonary:     Effort: Pulmonary effort is normal. No respiratory distress.     Breath sounds: Normal breath sounds.  Abdominal:     Palpations: Abdomen is soft.     Tenderness: There is no abdominal tenderness.  Musculoskeletal:        General: No swelling.     Cervical back: Neck supple.      Comments: Left Knee: Tenderness to palpation of lateral and medial joint lines. An effusion is not present.  Negative anterior and posterior drawer. Negative Mcmurray's. +Patellar stability. Negative valgus and varus stress test. Extension and flexion intact. No sensory deficits.  Mild tenderness behind posterior knee.  No swelling.  Right chest wall: Tenderness to palpation over right chest wall, no abrasions, ecchymosis, edema.  Skin:    General: Skin is warm and dry.     Capillary Refill: Capillary refill takes less than 2 seconds.  Neurological:     Mental Status: She is alert.  Psychiatric:        Mood and Affect: Mood normal.     ED Results / Procedures / Treatments   Labs (all labs ordered are listed, but only abnormal results are displayed) Labs Reviewed  URINALYSIS, ROUTINE W REFLEX MICROSCOPIC - Abnormal; Notable for the following components:      Result Value   APPearance HAZY (*)    Hgb urine dipstick TRACE (*)    All other components within normal limits  URINALYSIS, MICROSCOPIC (REFLEX) - Abnormal; Notable for the following components:   Bacteria, UA MANY (*)    All other components within normal limits    EKG None  Radiology DG Ribs Unilateral W/Chest Right  Result Date: 10/17/2022 CLINICAL DATA:  Pain. EXAM: RIGHT RIBS AND CHEST - 3+ VIEW COMPARISON:  Chest x-ray 07/07/2022. FINDINGS: Subsegmental left midlung atelectasis. No confluent consolidation. No visible pleural effusions or pneumothorax. No displaced fracture. Cardiomediastinal silhouette is within normal limits. IMPRESSION: 1. Subsegmental left midlung atelectasis. 2. No displaced fracture. Electronically Signed   By: Feliberto Harts M.D.   On: 10/17/2022 17:15   DG Knee Complete 4 Views Left  Result Date: 10/17/2022 CLINICAL DATA:  Left knee pain for 2 months EXAM: LEFT KNEE - COMPLETE 4 VIEW COMPARISON:  Left knee radiographs May 30, 2015 FINDINGS: No evidence of fracture, dislocation, or joint  effusion. No evidence of arthropathy or other focal bone abnormality. Soft tissues are unremarkable. IMPRESSION: Negative. Electronically Signed   By: Jacob Moores M.D.   On: 10/17/2022 17:13    Procedures Procedures    Medications Ordered in ED Medications  lidocaine (LIDODERM) 5 % 1 patch (1 patch Transdermal Patch Applied 10/17/22 1749)    ED Course/ Medical Decision Making/ A&P                             Medical Decision Making Patient is a 37 year old female, here for  knee pain that is been going on for the last month, and she has tenderness to palpation of her lateral and medial joint lines, and no laxity, given buckling with pain, will obtain xray, suspicious for possible ligament sprain. Additionally ttp of right chest wall, worse with movement, negative PERC. No chest pain. Will obtain xray of ribs for further eval.  Amount and/or Complexity of Data Reviewed Labs: ordered. Radiology: ordered.    Details: No acute abnormalities Discussion of management or test interpretation with external provider(s): Patient here for right lateral rib cage pain, tenderness to palpation on exam, negative x-ray, suspicious for possible costochondritis, versus muscle strain, there was no trauma, the pain is only with tenderness to palpation, and worse with movements, which strongly aligns with a musculoskeletal injury, and she does not have any central chest pain, shortness of breath, and is PERC negative.  Is not on any oral contraceptives.  Treat with Tylenol ibuprofen, lidocaine patches.  Knee is not swollen, but does have tenderness to palpation of the medial and lateral joint lines, and a little bit of posterior tenderness.  There is no swelling however of this, and is been buckling, this is suspicious for possible ligament sprain, and patient should follow-up with her primary care doctor,/orthopedics for further evaluation.  Risk Prescription drug management.    Final Clinical  Impression(s) / ED Diagnoses Final diagnoses:  Left knee pain, unspecified chronicity  Muscle strain of chest wall, initial encounter    Rx / DC Orders ED Discharge Orders     None         Danaka Llera, Harley Alto, PA 10/17/22 1903    Virgina Norfolk, DO 10/17/22 1916

## 2022-12-19 ENCOUNTER — Other Ambulatory Visit: Payer: Self-pay

## 2022-12-19 ENCOUNTER — Encounter (HOSPITAL_BASED_OUTPATIENT_CLINIC_OR_DEPARTMENT_OTHER): Payer: Self-pay | Admitting: Emergency Medicine

## 2022-12-19 ENCOUNTER — Emergency Department (HOSPITAL_BASED_OUTPATIENT_CLINIC_OR_DEPARTMENT_OTHER)
Admission: EM | Admit: 2022-12-19 | Discharge: 2022-12-19 | Disposition: A | Discharge status: Home / Self Care | Payer: Managed Care, Other (non HMO) | Attending: Emergency Medicine | Admitting: Emergency Medicine

## 2022-12-19 DIAGNOSIS — L03011 Cellulitis of right finger: Secondary | ICD-10-CM | POA: Diagnosis present

## 2022-12-19 MED ORDER — DOXYCYCLINE HYCLATE 100 MG PO TABS
100.0000 mg | ORAL_TABLET | Freq: Once | ORAL | Status: AC
  Administered 2022-12-19: 100 mg via ORAL
  Filled 2022-12-19: qty 1

## 2022-12-19 MED ORDER — DOXYCYCLINE HYCLATE 100 MG PO CAPS: 100.0000 mg | ORAL_CAPSULE | Freq: Two times a day (BID) | ORAL | 0 refills | Status: DC

## 2022-12-19 NOTE — Discharge Instructions (Signed)
Apply warm compresses as frequently as possible for the next several days.  Begin taking doxycycline as prescribed.  Follow-up with primary doctor if not improving in the next few days, and return to the ER symptoms significantly worsen or change.

## 2022-12-19 NOTE — ED Provider Notes (Signed)
[facility] Provider Note   CSN: [id number] Arrival date & time: 12/19/22  2254     History  Chief Complaint  Patient presents with   Hand Pain    Jill Odom is a 37 y.o. female.  Patient is a 37 year old female presenting with complaints of pain and swelling to the right middle finger.  She was seen 12 days ago at an outside facility and diagnosed with a paronychia.  She had an incision and drainage performed and was treated with Bactroban and Keflex.  She finished the antibiotics approximately 1 week ago, but now symptoms seem to be returning.  She describes increased redness and pain adjacent to the nail.  No fevers or chills.  The history is provided by the patient.       Home Medications Prior to Admission medications   Medication Sig Start Date End Date Taking? Authorizing Provider  acetaminophen (TYLENOL) 500 MG tablet Take 1,000 mg by mouth every 6 (six) hours as needed for mild pain.    [provider]  albuterol (VENTOLIN HFA) 108 (90 Base) MCG/ACT inhaler Inhale 1-2 puffs into the lungs every 6 (six) hours as needed for wheezing or shortness of breath. Patient not taking: Reported on 02/07/2022 03/26/21   Long, Arlyss Repress, MD  benzonatate (TESSALON) 100 MG capsule Take 1 capsule (100 mg total) by mouth every 8 (eight) hours. 07/07/22   Long, Arlyss Repress, MD  fluticasone (FLONASE) 50 MCG/ACT nasal spray Place 2 sprays into both nostrils daily for 7 days. 07/07/22 07/14/22  Long, Arlyss Repress, MD  HUMIRA PEN 40 MG/0.8ML PNKT SMARTSIG:1 Pre-Filled Pen Syringe SUB-Q Once a Week Patient not taking: Reported on 02/07/2022 10/28/20   [provider]  HYDROcodone-acetaminophen (NORCO/VICODIN) 5-325 MG tablet Take 1 tablet by mouth every 4 (four) hours as needed for moderate pain. 02/23/22 02/23/23  Bjorn Pippin, MD  ibuprofen (ADVIL) 200 MG tablet Take 400 mg by mouth every 6 (six) hours as needed for mild pain or headache.     [provider]  loperamide (IMODIUM) 2 MG capsule Take 1 capsule (2 mg total) by mouth 4 (four) times daily as needed for diarrhea or loose stools. 07/07/22   Long, Arlyss Repress, MD  phosphorus (K PHOS NEUTRAL) [phone] MG tablet Take 2 tablets (500 mg total) by mouth 3 (three) times daily. 02/09/22   Dimple Nanas, MD      Allergies    Caffeine    Review of Systems   Review of Systems  All other systems reviewed and are negative.   Physical Exam Updated Vital Signs BP (!) 154/96 (BP Location: Right Arm)   Pulse 90   Temp 98.3 F (36.8 C) (Oral)   Resp 18   Ht 5\' 3"  (1.6 m)   Wt (!) 166 kg   SpO2 99%   BMI 64.83 kg/m  Physical Exam Vitals and nursing note reviewed.  Constitutional:      Appearance: Normal appearance.  HENT:     Head: Normocephalic and atraumatic.  Pulmonary:     Effort: Pulmonary effort is normal.  Skin:    General: Skin is warm and dry.     Comments: The right middle finger has swelling and erythema adjacent to the nail consistent with paronychia.  There is no redness extending up the hand.  No red streaks.  Neurological:     Mental Status: She is alert and oriented to person, place, and time.  ED Results / Procedures / Treatments   Labs (all labs ordered are listed, but only abnormal results are displayed) Labs Reviewed - No data to display  EKG None  Radiology No results found.  Procedures Procedures    Medications Ordered in ED Medications  doxycycline (VIBRA-TABS) tablet 100 mg (has no administration in time range)    ED Course/ Medical Decision Making/ A&P  Patient with a recurring paronychia.  This to be treated with warm compresses and doxycycline.  I feel no fluctuance at this point that I would incise and drain.  To return as needed if not improving.  Final Clinical Impression(s) / ED Diagnoses Final diagnoses:  None    Rx / DC Orders ED Discharge Orders     None         Geoffery Lyons, MD 12/19/22  2321

## 2022-12-19 NOTE — ED Triage Notes (Addendum)
Pt from home, c/o right middle finger pain X3 weeks.  Pt had it drained and was on antibiotics but the swelling is starting to return.    Pt's finger is red and does appear swollen.

## 2023-04-25 ENCOUNTER — Telehealth: Payer: Managed Care, Other (non HMO) | Admitting: Physician Assistant

## 2023-04-25 DIAGNOSIS — R110 Nausea: Secondary | ICD-10-CM | POA: Diagnosis not present

## 2023-04-25 DIAGNOSIS — R1110 Vomiting, unspecified: Secondary | ICD-10-CM

## 2023-04-25 DIAGNOSIS — J069 Acute upper respiratory infection, unspecified: Secondary | ICD-10-CM

## 2023-04-26 MED ORDER — ONDANSETRON 4 MG PO TBDP
4.0000 mg | ORAL_TABLET | Freq: Three times a day (TID) | ORAL | 0 refills | Status: DC | PRN
Start: 2023-04-26 — End: 2024-02-28

## 2023-04-26 NOTE — Progress Notes (Signed)
I have spent 5 minutes in review of e-visit questionnaire, review and updating patient chart, medical decision making and response to patient.   Mia Milan Cody Jacklynn Dehaas, PA-C    

## 2023-04-26 NOTE — Progress Notes (Signed)

## 2023-06-03 ENCOUNTER — Other Ambulatory Visit: Payer: Self-pay

## 2023-06-03 ENCOUNTER — Encounter (HOSPITAL_BASED_OUTPATIENT_CLINIC_OR_DEPARTMENT_OTHER): Payer: Self-pay | Admitting: Emergency Medicine

## 2023-06-03 ENCOUNTER — Emergency Department (HOSPITAL_BASED_OUTPATIENT_CLINIC_OR_DEPARTMENT_OTHER): Payer: Managed Care, Other (non HMO)

## 2023-06-03 ENCOUNTER — Emergency Department (HOSPITAL_BASED_OUTPATIENT_CLINIC_OR_DEPARTMENT_OTHER)
Admission: EM | Admit: 2023-06-03 | Discharge: 2023-06-03 | Disposition: A | Discharge status: Home / Self Care | Payer: Managed Care, Other (non HMO) | Attending: Emergency Medicine | Admitting: Emergency Medicine

## 2023-06-03 DIAGNOSIS — Z1152 Encounter for screening for COVID-19: Secondary | ICD-10-CM | POA: Diagnosis not present

## 2023-06-03 DIAGNOSIS — R052 Subacute cough: Secondary | ICD-10-CM | POA: Diagnosis not present

## 2023-06-03 DIAGNOSIS — J45909 Unspecified asthma, uncomplicated: Secondary | ICD-10-CM | POA: Insufficient documentation

## 2023-06-03 DIAGNOSIS — B974 Respiratory syncytial virus as the cause of diseases classified elsewhere: Secondary | ICD-10-CM | POA: Insufficient documentation

## 2023-06-03 DIAGNOSIS — R059 Cough, unspecified: Secondary | ICD-10-CM | POA: Diagnosis present

## 2023-06-03 DIAGNOSIS — Z7951 Long term (current) use of inhaled steroids: Secondary | ICD-10-CM | POA: Insufficient documentation

## 2023-06-03 DIAGNOSIS — B338 Other specified viral diseases: Secondary | ICD-10-CM

## 2023-06-03 LAB — RESP PANEL BY RT-PCR (RSV, FLU A&B, COVID)  RVPGX2
Influenza A by PCR: NEGATIVE
Influenza B by PCR: NEGATIVE
Resp Syncytial Virus by PCR: POSITIVE — AB
SARS Coronavirus 2 by RT PCR: NEGATIVE

## 2023-06-03 MED ORDER — PREDNISONE 20 MG PO TABS: 40.0000 mg | ORAL_TABLET | Freq: Every day | ORAL | 0 refills | Status: DC

## 2023-06-03 MED ORDER — ALBUTEROL SULFATE HFA 108 (90 BASE) MCG/ACT IN AERS
2.0000 | INHALATION_SPRAY | RESPIRATORY_TRACT | Status: DC | PRN
  Filled 2023-06-03: qty 6.7

## 2023-06-03 MED ORDER — IPRATROPIUM-ALBUTEROL 0.5-2.5 (3) MG/3ML IN SOLN
3.0000 mL | Freq: Once | RESPIRATORY_TRACT | Status: AC
  Administered 2023-06-03: 3 mL via RESPIRATORY_TRACT
  Filled 2023-06-03: qty 3

## 2023-06-03 MED ORDER — ALBUTEROL SULFATE (2.5 MG/3ML) 0.083% IN NEBU
2.5000 mg | INHALATION_SOLUTION | Freq: Once | RESPIRATORY_TRACT | Status: AC
  Administered 2023-06-03: 2.5 mg via RESPIRATORY_TRACT
  Filled 2023-06-03: qty 3

## 2023-06-03 MED ORDER — PREDNISONE 50 MG PO TABS
60.0000 mg | ORAL_TABLET | Freq: Once | ORAL | Status: AC
  Administered 2023-06-03: 60 mg via ORAL
  Filled 2023-06-03: qty 1

## 2023-06-03 NOTE — ED Notes (Signed)
Last Neb ~1:30   06/03/23 1451  Respiratory Assessment  $ RT Protocol Assessment  Yes  Assessment Type Pre-treatment  Respiratory Pattern Regular;Symmetrical  Chest Assessment Chest expansion symmetrical  Cough Strong;Dry (white/green at times per patient)  R Upper  Breath Sounds Diminished  L Upper Breath Sounds Diminished;Expiratory wheezes  R Lower Breath Sounds Diminished  L Lower Breath Sounds Diminished  Oxygen Therapy/Pulse Ox  O2 Therapy Room air

## 2023-06-03 NOTE — ED Notes (Signed)

## 2023-06-03 NOTE — ED Provider Notes (Signed)
[place] [facility] AT [facility] Provider Note   CSN: [id number] Arrival date & time: 06/03/23  1359     History  Chief Complaint  Patient presents with   Cough   Shortness of Breath    Jill Odom is a 37 y.o. female.  Patient with history of asthma --presents to the emergency department today for ongoing cough.  She has had symptoms for over a month.  She had an e-visit on 10/21 for viral URI symptoms and vomiting, was prescribed Zofran.  She had a minute clinic appointment on 11/1 and was diagnosed with bronchitis.  She was given cough suppressant medications, azithromycin, albuterol, prednisone.  Her symptoms did not improve and she followed up on 11/10 for ongoing coughing.  She was prescribed cough syrup.  No further antibiotics or steroids.  Patient states that in the past couple of weeks her symptoms had been improving but not resolved.  Over the past 2 days the cough has come back and she has had increased shortness of breath.  No lower extremity swelling.  No documented fevers.  She has had some posttussive emesis.  No chest or abdominal pain.  No known sick contacts.       Home Medications Prior to Admission medications   Medication Sig Start Date End Date Taking? Authorizing Provider  acetaminophen (TYLENOL) 500 MG tablet Take 1,000 mg by mouth every 6 (six) hours as needed for mild pain.    [provider]  albuterol (VENTOLIN HFA) 108 (90 Base) MCG/ACT inhaler Inhale 1-2 puffs into the lungs every 6 (six) hours as needed for wheezing or shortness of breath. Patient not taking: Reported on 02/07/2022 03/26/21   Long, Arlyss Repress, MD  benzonatate (TESSALON) 100 MG capsule Take 1 capsule (100 mg total) by mouth every 8 (eight) hours. 07/07/22   Long, Arlyss Repress, MD  doxycycline (VIBRAMYCIN) 100 MG capsule Take 1 capsule (100 mg total) by mouth 2 (two) times daily. One po bid x 7 days 12/19/22   Geoffery Lyons, MD  fluticasone [facility]) 50 MCG/ACT  nasal spray Place 2 sprays into both nostrils daily for 7 days. 07/07/22 07/14/22  Long, Arlyss Repress, MD  HUMIRA PEN 40 MG/0.8ML PNKT SMARTSIG:1 Pre-Filled Pen Syringe SUB-Q Once a Week Patient not taking: Reported on 02/07/2022 10/28/20   [provider]  ibuprofen (ADVIL) 200 MG tablet Take 400 mg by mouth every 6 (six) hours as needed for mild pain or headache.    [provider]  loperamide (IMODIUM) 2 MG capsule Take 1 capsule (2 mg total) by mouth 4 (four) times daily as needed for diarrhea or loose stools. 07/07/22   Long, Arlyss Repress, MD  ondansetron (ZOFRAN-ODT) 4 MG disintegrating tablet Take 1 tablet (4 mg total) by mouth every 8 (eight) hours as needed for nausea or vomiting. 04/26/23   Waldon Merl, PA-C  phosphorus (K PHOS NEUTRAL) [phone] MG tablet Take 2 tablets (500 mg total) by mouth 3 (three) times daily. 02/09/22   Miguel Rota, MD      Allergies    Caffeine    Review of Systems   Review of Systems  Physical Exam Updated Vital Signs BP 125/80 (BP Location: Left Arm)   Pulse 74   Temp 97.8 F (36.6 C) (Oral)   Resp 20   Ht 5\' 3"  (1.6 m)   Wt (!) 166 kg   LMP 05/20/2023 (Approximate)   SpO2 99%   BMI 64.83 kg/m   Physical  Exam Vitals and nursing note reviewed.  Constitutional:      Appearance: She is well-developed.  HENT:     Head: Normocephalic and atraumatic.     Jaw: No trismus.     Right Ear: Tympanic membrane, ear canal and external ear normal.     Left Ear: Tympanic membrane, ear canal and external ear normal.     Nose: Nose normal. No mucosal edema or rhinorrhea.     Mouth/Throat:     Mouth: Mucous membranes are moist. Mucous membranes are not dry. No oral lesions.     Pharynx: Uvula midline. No oropharyngeal exudate, posterior oropharyngeal erythema or uvula swelling.     Tonsils: No tonsillar abscesses.  Eyes:     General:        Right eye: No discharge.        Left eye: No discharge.     Conjunctiva/sclera: Conjunctivae normal.   Cardiovascular:     Rate and Rhythm: Normal rate and regular rhythm.     Heart sounds: Normal heart sounds.  Pulmonary:     Effort: Pulmonary effort is normal. No respiratory distress.     Breath sounds: Examination of the right-upper field reveals decreased breath sounds. Examination of the left-upper field reveals decreased breath sounds. Examination of the right-middle field reveals decreased breath sounds. Examination of the left-middle field reveals decreased breath sounds. Examination of the right-lower field reveals decreased breath sounds. Examination of the left-lower field reveals decreased breath sounds. Decreased breath sounds present. No wheezing or rales.  Abdominal:     Palpations: Abdomen is soft.     Tenderness: There is no abdominal tenderness.  Musculoskeletal:     Cervical back: Normal range of motion and neck supple.  Lymphadenopathy:     Cervical: No cervical adenopathy.  Skin:    General: Skin is warm and dry.  Neurological:     Mental Status: She is alert.  Psychiatric:        Mood and Affect: Mood normal.     ED Results / Procedures / Treatments   Labs (all labs ordered are listed, but only abnormal results are displayed) Labs Reviewed  RESP PANEL BY RT-PCR (RSV, FLU A&B, COVID)  [id number]    EKG None  Radiology No results found.  Procedures Procedures    Medications Ordered in ED Medications  albuterol (VENTOLIN HFA) 108 (90 Base) MCG/ACT inhaler 2 puff (has no administration in time range)  ipratropium-albuterol (DUONEB) 0.5-2.5 (3) MG/3ML nebulizer solution 3 mL (3 mLs Nebulization Given 06/03/23 1500)  albuterol (PROVENTIL) (2.5 MG/3ML) 0.083% nebulizer solution 2.5 mg (2.5 mg Nebulization Given 06/03/23 1500)    ED Course/ Medical Decision Making/ A&P    Patient seen and examined. History obtained directly from patient.  I reviewed recent PCP and minute clinic appointment notes.  Labs/EKG: Ordered viral panel.  Imaging: Ordered chest  x-ray to be.  Medications/Fluids: Ordered: Albuterol/Atrovent.   Most recent vital signs reviewed and are as follows: BP 125/80 (BP Location: Left Arm)   Pulse 74   Temp 97.8 F (36.6 C) (Oral)   Resp 20   Ht 5\' 3"  (1.6 m)   Wt (!) 166 kg   LMP 05/20/2023 (Approximate)   SpO2 98%   BMI 64.83 kg/m   Initial impression: Subacute to recurrent cough.  4:56 PM Reassessment performed. Patient appears stable, continues with recurrent coughing.  Labs personally reviewed and interpreted including: Viral panel positive for RSV.  Imaging personally visualized and interpreted including: Chest x-ray agree  negative.  Reviewed pertinent lab work and imaging with patient at bedside. Questions answered.   Most current vital signs reviewed and are as follows: BP 125/80 (BP Location: Left Arm)   Pulse 74   Temp 97.8 F (36.6 C) (Oral)   Resp 20   Ht 5\' 3"  (1.6 m)   Wt (!) 166 kg   LMP 05/20/2023 (Approximate)   SpO2 100%   BMI 64.83 kg/m   Plan: Discharge to home.  Continue albuterol nebs at home every 4 hours over the next couple of days and then just as needed  Prescriptions written for: Prednisone  Other home care instructions discussed: Rest, hydration, cough suppression  ED return instructions discussed: Worsening shortness of breath or trouble breathing, persistent vomiting, new or worsening symptoms  Follow-up instructions discussed: Patient encouraged to follow-up with their PCP in 5 days if not improving.                                  Medical Decision Making Amount and/or Complexity of Data Reviewed Radiology: ordered.  Risk Prescription drug management.   Patient with cough over the past month but worsening again over the past couple of days.  She tested positive for RSV here tonight.  Chest x-ray is clear without signs of pneumonia.  She has underlying asthma with diminished lung sounds on exam and not any significant wheezing.  She has a bothersome cough.  She  will be started on another burst of prednisone starting today for 5 days.  She will continue albuterol at home as well as cough suppressant medications that she already has.  The patient's vital signs, pertinent lab work and imaging were reviewed and interpreted as discussed in the ED course. Hospitalization was considered for further testing, treatments, or serial exams/observation. However as patient is well-appearing, has a stable exam, and reassuring studies today, I do not feel that they warrant admission at this time. This plan was discussed with the patient who verbalizes agreement and comfort with this plan and seems reliable and able to return to the Emergency Department with worsening or changing symptoms.           Final Clinical Impression(s) / ED Diagnoses Final diagnoses:  RSV (respiratory syncytial virus infection)  Subacute cough    Rx / DC Orders ED Discharge Orders          Ordered    predniSONE (DELTASONE) 20 MG tablet  Daily        06/03/23 1700              Renne Crigler, PA-C 06/03/23 1700    Estelle June A, DO 06/11/23 1038

## 2023-06-03 NOTE — ED Triage Notes (Signed)
Pt POV steady gait- c/o cough x 1 month, reports new chills, fever, worsening cough. Using MDI and nebulizer at home, mild relief.   Hx asthma. RT to triage to assess.

## 2023-06-03 NOTE — ED Notes (Signed)
Fall risk armband Fall risk sign on door Patient wearing shoes

## 2023-06-03 NOTE — Discharge Instructions (Signed)
Please read and follow all provided instructions.  Your diagnoses today include:  1. RSV (respiratory syncytial virus infection)   2. Subacute cough     Tests performed today include: Chest x-ray - does not show any pneumonia Flu and COVID-negative, RSV positive Vital signs. See below for your results today.   Medications prescribed:  Prednisone - steroid medicine   It is best to take this medication in the morning to prevent sleeping problems. If you are diabetic, monitor your blood sugar closely and stop taking Prednisone if blood sugar is over 300. Take with food to prevent stomach upset.   Take any prescribed medications only as directed.  Home care instructions:  Follow any educational materials contained in this packet.  Use home albuterol and cough suppressant medications as prescribed.  Follow-up instructions: Please follow-up with your primary care provider in the next 5 days for further evaluation of your symptoms and a recheck if you are not feeling better.   Return instructions:  Please return to the Emergency Department if you experience worsening symptoms. Please return with worsening wheezing, shortness of breath, or difficulty breathing. Return with persistent fever above 101F.  Please return if you have any other emergent concerns.  Additional Information:  Your vital signs today were: BP 125/80 (BP Location: Left Arm)   Pulse 74   Temp 97.8 F (36.6 C) (Oral)   Resp 20   Ht 5\' 3"  (1.6 m)   Wt (!) 166 kg   LMP 05/20/2023 (Approximate)   SpO2 100%   BMI 64.83 kg/m  If your blood pressure (BP) was elevated above 135/85 this visit, please have this repeated by your doctor within one month. --------------

## 2023-06-14 ENCOUNTER — Emergency Department (HOSPITAL_BASED_OUTPATIENT_CLINIC_OR_DEPARTMENT_OTHER)
Admission: EM | Admit: 2023-06-14 | Discharge: 2023-06-14 | Disposition: A | Discharge status: Home / Self Care | Payer: Managed Care, Other (non HMO) | Attending: Emergency Medicine | Admitting: Emergency Medicine

## 2023-06-14 ENCOUNTER — Other Ambulatory Visit: Payer: Self-pay

## 2023-06-14 ENCOUNTER — Emergency Department (HOSPITAL_BASED_OUTPATIENT_CLINIC_OR_DEPARTMENT_OTHER): Payer: Managed Care, Other (non HMO)

## 2023-06-14 DIAGNOSIS — R0789 Other chest pain: Secondary | ICD-10-CM | POA: Insufficient documentation

## 2023-06-14 MED ORDER — OXYCODONE-ACETAMINOPHEN 5-325 MG PO TABS: 1.0000 | ORAL_TABLET | Freq: Four times a day (QID) | ORAL | 0 refills | Status: DC | PRN

## 2023-06-14 MED ORDER — IBUPROFEN 600 MG PO TABS: 600.0000 mg | ORAL_TABLET | Freq: Four times a day (QID) | ORAL | 0 refills | Status: DC | PRN

## 2023-06-14 MED ORDER — OXYCODONE-ACETAMINOPHEN 5-325 MG PO TABS
1.0000 | ORAL_TABLET | Freq: Once | ORAL | Status: AC
  Administered 2023-06-14: 1 via ORAL
  Filled 2023-06-14: qty 1

## 2023-06-14 NOTE — ED Provider Notes (Signed)
[place] [facility] AT [facility] Provider Note   CSN: [id number] Arrival date & time: 06/14/23  1910     History  Chief Complaint  Patient presents with   Rib Injury    Jill Odom is a 37 y.o. female.  Patient to ED c/o left sided lower chest pain that started 3 days ago. She reports having a cough that had made her upper abdomen sore, but the lower left chest pain started without warning and has continued with movement, cough. No fall or injury. She reports she can't sleep on that side. No fever. No nausea, vomiting or abdominal pain. No back pain.   The history is provided by the patient. No language interpreter was used.       Home Medications Prior to Admission medications   Medication Sig Start Date End Date Taking? Authorizing Provider  ibuprofen (ADVIL) 600 MG tablet Take 1 tablet (600 mg total) by mouth every 6 (six) hours as needed. 06/14/23  Yes Elpidio Anis, PA-C  oxyCODONE-acetaminophen (PERCOCET/ROXICET) 5-325 MG tablet Take 1 tablet by mouth every 6 (six) hours as needed for severe pain (pain score 7-10). 06/14/23  Yes Elpidio Anis, PA-C  acetaminophen (TYLENOL) 500 MG tablet Take 1,000 mg by mouth every 6 (six) hours as needed for mild pain.    [provider]  albuterol (VENTOLIN HFA) 108 (90 Base) MCG/ACT inhaler Inhale 1-2 puffs into the lungs every 6 (six) hours as needed for wheezing or shortness of breath. Patient not taking: Reported on 02/07/2022 03/26/21   Long, Arlyss Repress, MD  benzonatate (TESSALON) 100 MG capsule Take 1 capsule (100 mg total) by mouth every 8 (eight) hours. 07/07/22   Long, Arlyss Repress, MD  doxycycline (VIBRAMYCIN) 100 MG capsule Take 1 capsule (100 mg total) by mouth 2 (two) times daily. One po bid x 7 days 12/19/22   Geoffery Lyons, MD  fluticasone [facility]) 50 MCG/ACT nasal spray Place 2 sprays into both nostrils daily for 7 days. 07/07/22 07/14/22  Long, Arlyss Repress, MD  HUMIRA PEN 40 MG/0.8ML PNKT SMARTSIG:1  Pre-Filled Pen Syringe SUB-Q Once a Week Patient not taking: Reported on 02/07/2022 10/28/20   [provider]  loperamide (IMODIUM) 2 MG capsule Take 1 capsule (2 mg total) by mouth 4 (four) times daily as needed for diarrhea or loose stools. 07/07/22   Long, Arlyss Repress, MD  ondansetron (ZOFRAN-ODT) 4 MG disintegrating tablet Take 1 tablet (4 mg total) by mouth every 8 (eight) hours as needed for nausea or vomiting. 04/26/23   Waldon Merl, PA-C  phosphorus (K PHOS NEUTRAL) [phone] MG tablet Take 2 tablets (500 mg total) by mouth 3 (three) times daily. 02/09/22   Amin, Ankit C, MD  predniSONE (DELTASONE) 20 MG tablet Take 2 tablets (40 mg total) by mouth daily. 06/03/23   Renne Crigler, PA-C      Allergies    Caffeine    Review of Systems   Review of Systems  Physical Exam Updated Vital Signs BP 131/67   Pulse 93   Temp 98.1 F (36.7 C)   Resp 18   Ht 5\' 4"  (1.626 m)   Wt (!) 165.6 kg   LMP 05/20/2023 (Approximate)   SpO2 100%   BMI 62.65 kg/m  Physical Exam Vitals and nursing note reviewed.  Constitutional:      Appearance: Normal appearance. She is obese.  Cardiovascular:     Rate and Rhythm: Normal rate.     Heart sounds: No murmur  heard. Pulmonary:     Effort: Pulmonary effort is normal.     Breath sounds: Normal breath sounds. No wheezing, rhonchi or rales.  Chest:     Chest wall: Tenderness present.    Abdominal:     Palpations: Abdomen is soft.     Tenderness: There is no abdominal tenderness.  Musculoskeletal:        General: Normal range of motion.  Skin:    General: Skin is warm and dry.  Neurological:     Mental Status: She is alert and oriented to person, place, and time.     ED Results / Procedures / Treatments   Labs (all labs ordered are listed, but only abnormal results are displayed) Labs Reviewed - No data to display  EKG None  Radiology DG Chest 2 View  Result Date: 06/14/2023 CLINICAL DATA:  Rib injury EXAM: CHEST - 2  VIEW COMPARISON:  06/03/2023 FINDINGS: Lungs are clear.  No pleural effusion or pneumothorax. The heart is normal in size. Mild degenerative changes of the visualized thoracolumbar spine. IMPRESSION: Normal chest radiographs. Electronically Signed   By: Charline Bills M.D.   On: 06/14/2023 20:05    Procedures Procedures    Medications Ordered in ED Medications  oxyCODONE-acetaminophen (PERCOCET/ROXICET) 5-325 MG per tablet 1 tablet (1 tablet Oral Given 06/14/23 2225)    ED Course/ Medical Decision Making/ A&P Clinical Course as of 06/14/23 2226  Tue Jun 14, 2023  2222 Patient with left lower chest pain x 3 days without injury. Has had a cough with upper abdominal soreness but pain of complaint is more intense. CXR negative for infiltrates or PTX. VSS, no hypoxia or tachycardia. Pain is reproducible supporting diagnosis of MSK chest wall pain. Will treat supportively. She has established PCP for recheck if no better in 2-3 days.  [SU]    Clinical Course User Index [SU] Elpidio Anis, PA-C                                 Medical Decision Making          Final Clinical Impression(s) / ED Diagnoses Final diagnoses:  Chest wall pain    Rx / DC Orders ED Discharge Orders          Ordered    oxyCODONE-acetaminophen (PERCOCET/ROXICET) 5-325 MG tablet  Every 6 hours PRN        06/14/23 2217    ibuprofen (ADVIL) 600 MG tablet  Every 6 hours PRN        06/14/23 2217              Elpidio Anis, PA-C 06/14/23 2226    Tegeler, Canary Brim, MD 06/14/23 2255

## 2023-06-14 NOTE — Discharge Instructions (Signed)
Warm compresses to the sore areas. Take the medications as prescribed: the ibuprofen every 6 hours and the oxycodone every 4 hours as needed.   If pain is no better in 2-3 days, please see your doctor for recheck. If symptoms worsen, return to the ED for further evaluation.

## 2023-06-14 NOTE — ED Triage Notes (Signed)
Patient ambulated to triage c/o rib pain under left breast that started 06/10/23. Patient reports pain to left side with movement, coughing, and palpation. Patient denies any injury or trauma. Patient is AAOx4, NAD.

## 2024-02-02 ENCOUNTER — Other Ambulatory Visit: Payer: Self-pay

## 2024-02-02 ENCOUNTER — Encounter (HOSPITAL_BASED_OUTPATIENT_CLINIC_OR_DEPARTMENT_OTHER): Payer: Self-pay

## 2024-02-02 DIAGNOSIS — R109 Unspecified abdominal pain: Secondary | ICD-10-CM | POA: Insufficient documentation

## 2024-02-02 DIAGNOSIS — R1111 Vomiting without nausea: Secondary | ICD-10-CM | POA: Insufficient documentation

## 2024-02-02 LAB — COMPREHENSIVE METABOLIC PANEL WITH GFR
ALT: 14 U/L (ref 0–44)
AST: 15 U/L (ref 15–41)
Albumin: 3.6 g/dL (ref 3.5–5.0)
Alkaline Phosphatase: 85 U/L (ref 38–126)
Anion gap: 9 (ref 5–15)
BUN: 11 mg/dL (ref 6–20)
CO2: 23 mmol/L (ref 22–32)
Calcium: 10.6 mg/dL — ABNORMAL HIGH (ref 8.9–10.3)
Chloride: 107 mmol/L (ref 98–111)
Creatinine, Ser: 0.68 mg/dL (ref 0.44–1.00)
GFR, Estimated: >60 mL/min (ref >60)
Glucose, Bld: 103 mg/dL — ABNORMAL HIGH (ref 70–99)
Potassium: 3.8 mmol/L (ref 3.5–5.1)
Sodium: 139 mmol/L (ref 135–145)
Total Bilirubin: 0.4 mg/dL (ref 0.0–1.2)
Total Protein: 7.9 g/dL (ref 6.5–8.1)

## 2024-02-02 LAB — LIPASE, BLOOD: Lipase: 20 U/L (ref 11–51)

## 2024-02-02 LAB — CBC
HCT: 33 % — ABNORMAL LOW (ref 36.0–46.0)
Hemoglobin: 10.2 g/dL — ABNORMAL LOW (ref 12.0–15.0)
MCH: 25.5 pg — ABNORMAL LOW (ref 26.0–34.0)
MCHC: 30.9 g/dL (ref 30.0–36.0)
MCV: 82.5 fL (ref 80.0–100.0)
Platelets: 413 K/uL — ABNORMAL HIGH (ref 150–400)
RBC: 4 MIL/uL (ref 3.87–5.11)
RDW: 15.8 % — ABNORMAL HIGH (ref 11.5–15.5)
WBC: 9.3 K/uL (ref 4.0–10.5)
nRBC: 0 % (ref 0.0–0.2)

## 2024-02-02 NOTE — ED Triage Notes (Signed)
 Pt states she has N/V/D x 1 day. Denies any other family sick. Pt states she has vomited 5 times today. Denies any other s/s at time of triage.

## 2024-02-03 ENCOUNTER — Emergency Department (HOSPITAL_BASED_OUTPATIENT_CLINIC_OR_DEPARTMENT_OTHER)
Admission: EM | Admit: 2024-02-03 | Discharge: 2024-02-03 | Disposition: A | Discharge status: Home / Self Care | Attending: Emergency Medicine | Admitting: Emergency Medicine

## 2024-02-03 DIAGNOSIS — R1111 Vomiting without nausea: Secondary | ICD-10-CM | POA: Diagnosis not present

## 2024-02-03 LAB — PREGNANCY, URINE: Preg Test, Ur: NEGATIVE

## 2024-02-03 MED ORDER — ONDANSETRON HCL 4 MG PO TABS: 4.0000 mg | ORAL_TABLET | Freq: Four times a day (QID) | ORAL | 0 refills | Status: DC

## 2024-02-03 MED ORDER — ONDANSETRON 4 MG PO TBDP
4.0000 mg | ORAL_TABLET | Freq: Once | ORAL | Status: AC
  Administered 2024-02-03: 4 mg via ORAL
  Filled 2024-02-03: qty 1

## 2024-02-03 MED ORDER — LOPERAMIDE HCL 2 MG PO CAPS
4.0000 mg | ORAL_CAPSULE | Freq: Once | ORAL | Status: AC
  Administered 2024-02-03: 4 mg via ORAL
  Filled 2024-02-03: qty 2

## 2024-02-03 NOTE — ED Provider Notes (Signed)
 [place] [facility] Provider Note   CSN: [id number] Arrival date & time: 02/02/24  2308     History Chief Complaint  Patient presents with   Emesis    HPI Jill Odom is a 38 y.o. female presenting for chief complaint of nausea and vomiting and diarrhea States that it happened after she ate fast food.  States that she was recently started on Mounjaro and has been strictly adhering to a diet.  She states that yesterday she broke from the diet for fast food She is ambulatory tolerating p.o. intake at this time states substantial diarrhea as well as abdominal pain earlier in the day now resolved. Patient's recorded medical, surgical, social, medication list and allergies were reviewed in the Snapshot window as part of the initial history.   Review of Systems   Review of Systems  Constitutional:  Negative for chills and fever.  HENT:  Negative for ear pain and sore throat.   Eyes:  Negative for pain and visual disturbance.  Respiratory:  Negative for cough and shortness of breath.   Cardiovascular:  Negative for chest pain and palpitations.  Gastrointestinal:  Positive for diarrhea, nausea and vomiting. Negative for abdominal pain.  Genitourinary:  Negative for dysuria and hematuria.  Musculoskeletal:  Negative for arthralgias and back pain.  Skin:  Negative for color change and rash.  Neurological:  Negative for seizures and syncope.  All other systems reviewed and are negative.   Physical Exam Updated Vital Signs BP 139/89   Pulse 70   Temp 98.1 F (36.7 C) (Oral)   Resp 18   Ht 5' 3 (1.6 m)   Wt (!) 173.3 kg   LMP 01/30/2024 (Exact Date)   SpO2 97%   BMI 67.67 kg/m  Physical Exam Vitals and nursing note reviewed.  Constitutional:      General: She is not in acute distress.    Appearance: She is well-developed.  HENT:     Head: Normocephalic and atraumatic.  Eyes:     Conjunctiva/sclera: Conjunctivae normal.   Cardiovascular:     Rate and Rhythm: Normal rate and regular rhythm.     Heart sounds: No murmur heard. Pulmonary:     Effort: Pulmonary effort is normal. No respiratory distress.     Breath sounds: Normal breath sounds.  Abdominal:     General: There is no distension.     Palpations: Abdomen is soft.     Tenderness: There is no abdominal tenderness. There is no right CVA tenderness or left CVA tenderness.  Musculoskeletal:        General: No swelling or tenderness. Normal range of motion.     Cervical back: Neck supple.  Skin:    General: Skin is warm and dry.  Neurological:     General: No focal deficit present.     Mental Status: She is alert and oriented to person, place, and time. Mental status is at baseline.     Cranial Nerves: No cranial nerve deficit.      ED Course/ Medical Decision Making/ A&P    Procedures Procedures   Medications Ordered in ED Medications  ondansetron  (ZOFRAN -ODT) disintegrating tablet 4 mg (4 mg Oral Given 02/03/24 0124)  loperamide  (IMODIUM ) capsule 4 mg (4 mg Oral Given 02/03/24 0124)    Medical Decision Making:   Jill Odom is a 38 y.o. female who presented to the ED today with abdominal pain, detailed above.    Patient placed on continuous  vitals and telemetry monitoring while in ED which was reviewed periodically.  Complete initial physical exam performed, notably the patient  was HDS in NAD.     Reviewed and confirmed nursing documentation for past medical history, family history, social history.    Initial Assessment:   With the patient's presentation of abdominal pain, most likely diagnosis is nonspecific etiology. Other diagnoses were considered including (but not limited to) gastroenteritis, colitis, small bowel obstruction, appendicitis, cholecystitis, pancreatitis, nephrolithiasis, UTI, pyleonephritis, ruptured ectopic pregnancy, PID, ovarian torsion. These are considered less likely due to history of present illness and  physical exam findings.   This is most consistent with an acute life/limb threatening illness complicated by underlying chronic conditions.   Initial Plan:  CBC/CMP to evaluate for underlying infectious/metabolic etiology for patient's abdominal pain  Lipase to evaluate for pancreatitis  Urinalysis and repeat physical assessment to evaluate for UTI/Pyelonpehritis  Empiric management of symptoms with escalating pain control and antiemetics as needed.   Initial Study Results:   Laboratory  All laboratory results reviewed without evidence of clinically relevant pathology.    Final Reassessment and Plan:   On reassessment, her symptoms are grossly improved.  She is ambulatory tolerating p.o. intake.  Given involvement of both upper and lower gastrointestinal symptoms, likely gastroenteritis.  Given painless nature, appendicitis, cholecystitis considered grossly less likely in the setting of otherwise reassuring lab work.  Given restoration of normal p.o. function, patient is stable for outpatient care management follow-up with PCP.  Strict return precautions reinforced.      Clinical Impression:  1. Vomiting without nausea, unspecified vomiting type      Discharge   Final Clinical Impression(s) / ED Diagnoses Final diagnoses:  Vomiting without nausea, unspecified vomiting type    Rx / DC Orders ED Discharge Orders          Ordered    ondansetron  (ZOFRAN ) 4 MG tablet  Every 6 hours        02/03/24 0156              Jerral Meth, MD 02/03/24 9496

## 2024-02-27 ENCOUNTER — Other Ambulatory Visit: Payer: Self-pay

## 2024-02-27 ENCOUNTER — Encounter (HOSPITAL_BASED_OUTPATIENT_CLINIC_OR_DEPARTMENT_OTHER): Payer: Self-pay

## 2024-02-27 ENCOUNTER — Emergency Department (HOSPITAL_BASED_OUTPATIENT_CLINIC_OR_DEPARTMENT_OTHER)
Admission: EM | Admit: 2024-02-27 | Discharge: 2024-02-28 | Disposition: A | Discharge status: Home / Self Care | Attending: Emergency Medicine | Admitting: Emergency Medicine

## 2024-02-27 DIAGNOSIS — R112 Nausea with vomiting, unspecified: Secondary | ICD-10-CM | POA: Diagnosis present

## 2024-02-27 DIAGNOSIS — R1011 Right upper quadrant pain: Secondary | ICD-10-CM | POA: Diagnosis not present

## 2024-02-27 DIAGNOSIS — R1110 Vomiting, unspecified: Secondary | ICD-10-CM

## 2024-02-27 DIAGNOSIS — R110 Nausea: Secondary | ICD-10-CM

## 2024-02-27 MED ORDER — ONDANSETRON HCL 4 MG/2ML IJ SOLN
4.0000 mg | Freq: Once | INTRAMUSCULAR | Status: AC
  Administered 2024-02-27: 4 mg via INTRAVENOUS
  Filled 2024-02-27: qty 2

## 2024-02-27 MED ORDER — MORPHINE SULFATE (PF) 4 MG/ML IV SOLN
4.0000 mg | Freq: Once | INTRAVENOUS | Status: AC
  Administered 2024-02-27: 4 mg via INTRAVENOUS
  Filled 2024-02-27: qty 1

## 2024-02-27 MED ORDER — SODIUM CHLORIDE 0.9 % IV BOLUS
1000.0000 mL | Freq: Once | INTRAVENOUS | Status: AC
  Administered 2024-02-28: 1000 mL via INTRAVENOUS

## 2024-02-27 NOTE — ED Triage Notes (Signed)
 Severe generalized abdominal pain and vomiting that manifested ~930pm tonight.

## 2024-02-27 NOTE — ED Provider Notes (Signed)
 [place] [facility]  Provider Note  CSN: [id number] Arrival date & time: 02/27/24 2307  History Chief Complaint  Patient presents with   Vomiting    Jill Odom is a 38 y.o. female reports she was in her usual state of health until around 2130hrs tonight when she began having nausea, vomiting and diffuse abdominal pains. No diarrhea, no fever. Maybe some dysuria. Had a similar, but less severe episode about 3 weeks ago with a negative ED lab workup, no imaging done then. She has been on Wegovy recently, but typically does not have any issues after taking it and last dose was 4 days ago.    Home Medications Prior to Admission medications   Medication Sig Start Date End Date Taking? Authorizing Provider  acetaminophen  (TYLENOL ) 500 MG tablet Take 1,000 mg by mouth every 6 (six) hours as needed for mild pain.    [provider]  fluticasone  (FLONASE ) 50 MCG/ACT nasal spray Place 2 sprays into both nostrils daily for 7 days. 07/07/22 07/14/22  Long, Fonda MATSU, MD  ondansetron  (ZOFRAN -ODT) 4 MG disintegrating tablet Take 1 tablet (4 mg total) by mouth every 8 (eight) hours as needed for nausea or vomiting. 02/28/24   Roselyn Carlin NOVAK, MD     Allergies    Caffeine and Oxycodone    Review of Systems   Review of Systems Please see HPI for pertinent positives and negatives  Physical Exam BP 119/66 (BP Location: Right Wrist)   Pulse (!) 106   Temp (!) 97.5 F (36.4 C) (Oral)   Resp 20   LMP 01/30/2024 (Exact Date)   SpO2 99%   Physical Exam Vitals and nursing note reviewed.  Constitutional:      Appearance: Normal appearance.  HENT:     Head: Normocephalic and atraumatic.     Nose: Nose normal.     Mouth/Throat:     Mouth: Mucous membranes are moist.  Eyes:     Extraocular Movements: Extraocular movements intact.     Conjunctiva/sclera: Conjunctivae normal.  Cardiovascular:     Rate and Rhythm: Normal rate.  Pulmonary:      Effort: Pulmonary effort is normal.     Breath sounds: Normal breath sounds.  Abdominal:     General: Abdomen is flat.     Palpations: Abdomen is soft.     Tenderness: There is abdominal tenderness in the right upper quadrant and epigastric area. There is no guarding. Negative signs include Murphy's sign and McBurney's sign.  Musculoskeletal:        General: No swelling. Normal range of motion.     Cervical back: Neck supple.  Skin:    General: Skin is warm and dry.  Neurological:     General: No focal deficit present.     Mental Status: She is alert.  Psychiatric:        Mood and Affect: Mood normal.     ED Results / Procedures / Treatments   EKG None  Procedures Procedures  Medications Ordered in the ED Medications  ondansetron  (ZOFRAN ) injection 4 mg (4 mg Intravenous Given 02/27/24 2354)  morphine  (PF) 4 MG/ML injection 4 mg (4 mg Intravenous Given 02/27/24 2355)  sodium chloride  0.9 % bolus 1,000 mL (0 mLs Intravenous Stopped 02/28/24 0128)  iohexol  (OMNIPAQUE ) 300 MG/ML solution 125 mL (125 mLs Intravenous Contrast Given 02/28/24 0049)    Initial Impression and Plan  Patient here with abdominal pain and vomiting, some RUQ tenderness, but no  peritoneal signs. Will check labs, plan CT if HCG is neg. Pain/nausea meds and IVF for comfort.   ED Course   Clinical Course as of 02/28/24 0132  Tue Feb 28, 2024  0010 CBC is unremarkable.  [CS]  0029 CMP and lipase are unremarkable.  [CS]  0035 HCG is neg. CT ordered.  [CS]  0114 I personally viewed the images from radiology studies and agree with radiologist interpretation: CT shows an incidental kidney stone, hiatal hernia may be contributing to symptoms, no acute findings.  [CS]  0123 UA is clear.  [CS]  0130 Patient reports she is feeling better, not further vomiting since arrival. Discussed incidental CT findings with the patient. Plan discharge with Rx for zofran  if needed, recommend she discuss Wegovy with PCP if symptoms  return. RTED for any other concerns.  [CS]    Clinical Course User Index [CS] Roselyn Carlin NOVAK, MD     MDM Rules/Calculators/A&P Medical Decision Making Given presenting complaint, I considered that admission might be necessary. After review of results from ED lab and/or imaging studies, admission to the hospital is not indicated at this time.    Problems Addressed: Nausea and vomiting, unspecified vomiting type: acute illness or injury  Amount and/or Complexity of Data Reviewed Labs: ordered. Decision-making details documented in ED Course. Radiology: ordered and independent interpretation performed. Decision-making details documented in ED Course.  Risk Prescription drug management. Parenteral controlled substances. Decision regarding hospitalization.     Final Clinical Impression(s) / ED Diagnoses Final diagnoses:  Nausea and vomiting, unspecified vomiting type    Rx / DC Orders ED Discharge Orders          Ordered    ondansetron  (ZOFRAN -ODT) 4 MG disintegrating tablet  Every 8 hours PRN        02/28/24 0132             Roselyn Carlin NOVAK, MD 02/28/24 [phone]

## 2024-02-28 ENCOUNTER — Emergency Department (HOSPITAL_BASED_OUTPATIENT_CLINIC_OR_DEPARTMENT_OTHER)

## 2024-02-28 DIAGNOSIS — R112 Nausea with vomiting, unspecified: Secondary | ICD-10-CM | POA: Diagnosis not present

## 2024-02-28 LAB — URINALYSIS, W/ REFLEX TO CULTURE (INFECTION SUSPECTED)
Bilirubin Urine: NEGATIVE
Glucose, UA: NEGATIVE mg/dL
Ketones, ur: NEGATIVE mg/dL
Nitrite: NEGATIVE
Protein, ur: NEGATIVE mg/dL
Specific Gravity, Urine: 1.02 (ref 1.005–1.030)
pH: 6.5 (ref 5.0–8.0)

## 2024-02-28 LAB — CBC WITH DIFFERENTIAL/PLATELET
Abs Immature Granulocytes: 0.04 K/uL (ref 0.00–0.07)
Basophils Absolute: 0 K/uL (ref 0.0–0.1)
Basophils Relative: 0 %
Eosinophils Absolute: 0.2 K/uL (ref 0.0–0.5)
Eosinophils Relative: 2 %
HCT: 36.4 % (ref 36.0–46.0)
Hemoglobin: 11.2 g/dL — ABNORMAL LOW (ref 12.0–15.0)
Immature Granulocytes: 0 %
Lymphocytes Relative: 16 %
Lymphs Abs: 1.7 K/uL (ref 0.7–4.0)
MCH: 26 pg (ref 26.0–34.0)
MCHC: 30.8 g/dL (ref 30.0–36.0)
MCV: 84.7 fL (ref 80.0–100.0)
Monocytes Absolute: 0.6 K/uL (ref 0.1–1.0)
Monocytes Relative: 6 %
Neutro Abs: 7.9 K/uL — ABNORMAL HIGH (ref 1.7–7.7)
Neutrophils Relative %: 76 %
Platelets: 380 K/uL (ref 150–400)
RBC: 4.3 MIL/uL (ref 3.87–5.11)
RDW: 16.2 % — ABNORMAL HIGH (ref 11.5–15.5)
WBC: 10.4 K/uL (ref 4.0–10.5)
nRBC: 0 % (ref 0.0–0.2)

## 2024-02-28 LAB — COMPREHENSIVE METABOLIC PANEL WITH GFR
ALT: 12 U/L (ref 0–44)
AST: 17 U/L (ref 15–41)
Albumin: 3.9 g/dL (ref 3.5–5.0)
Alkaline Phosphatase: 98 U/L (ref 38–126)
Anion gap: 10 (ref 5–15)
BUN: 11 mg/dL (ref 6–20)
CO2: 24 mmol/L (ref 22–32)
Calcium: 11 mg/dL — ABNORMAL HIGH (ref 8.9–10.3)
Chloride: 105 mmol/L (ref 98–111)
Creatinine, Ser: 0.76 mg/dL (ref 0.44–1.00)
GFR, Estimated: >60 mL/min (ref >60)
Glucose, Bld: 107 mg/dL — ABNORMAL HIGH (ref 70–99)
Potassium: 4 mmol/L (ref 3.5–5.1)
Sodium: 139 mmol/L (ref 135–145)
Total Bilirubin: 0.5 mg/dL (ref 0.0–1.2)
Total Protein: 8.5 g/dL — ABNORMAL HIGH (ref 6.5–8.1)

## 2024-02-28 LAB — HCG, SERUM, QUALITATIVE: Preg, Serum: NEGATIVE

## 2024-02-28 LAB — LIPASE, BLOOD: Lipase: 23 U/L (ref 11–51)

## 2024-02-28 MED ORDER — ONDANSETRON 4 MG PO TBDP: 4.0000 mg | ORAL_TABLET | Freq: Three times a day (TID) | ORAL | 0 refills | Status: AC | PRN

## 2024-02-28 MED ORDER — IOHEXOL 300 MG/ML  SOLN
125.0000 mL | Freq: Once | INTRAMUSCULAR | Status: AC | PRN
  Administered 2024-02-28: 125 mL via INTRAVENOUS

## 2024-02-28 NOTE — ED Notes (Signed)
 Patient transported to CT
